# Patient Record
Sex: Male | Born: 1950 | Race: Black or African American | Hispanic: No | Marital: Married | Smoking: Former smoker
Health system: Southern US, Community
[De-identification: ages and names within clinical notes are randomized; demographics above are authoritative.]

## PROBLEM LIST (undated history)

## (undated) DIAGNOSIS — H409 Unspecified glaucoma: Secondary | ICD-10-CM

## (undated) DIAGNOSIS — H269 Unspecified cataract: Secondary | ICD-10-CM

## (undated) DIAGNOSIS — N3281 Overactive bladder: Secondary | ICD-10-CM

## (undated) DIAGNOSIS — J439 Emphysema, unspecified: Secondary | ICD-10-CM

## (undated) HISTORY — PX: COLONOSCOPY: SHX174

## (undated) HISTORY — PX: POLYPECTOMY: SHX149

## (undated) HISTORY — DX: Overactive bladder: N32.81

## (undated) HISTORY — DX: Unspecified cataract: H26.9

## (undated) HISTORY — DX: Unspecified glaucoma: H40.9

## (undated) HISTORY — DX: Emphysema, unspecified: J43.9

## (undated) HISTORY — PX: HAND SURGERY: SHX662

## (undated) HISTORY — PX: DENTAL SURGERY: SHX609

---

## 1999-08-18 ENCOUNTER — Encounter (INDEPENDENT_AMBULATORY_CARE_PROVIDER_SITE_OTHER): Payer: Self-pay | Admitting: Specialist

## 1999-08-18 ENCOUNTER — Other Ambulatory Visit: Admission: RE | Admit: 1999-08-18 | Discharge: 1999-08-18 | Payer: Self-pay | Admitting: Gastroenterology

## 2003-02-06 ENCOUNTER — Ambulatory Visit (HOSPITAL_COMMUNITY): Admission: RE | Admit: 2003-02-06 | Discharge: 2003-02-06 | Payer: Self-pay | Admitting: General Surgery

## 2003-03-10 ENCOUNTER — Ambulatory Visit (HOSPITAL_COMMUNITY): Admission: RE | Admit: 2003-03-10 | Discharge: 2003-03-10 | Payer: Self-pay | Admitting: General Surgery

## 2003-08-23 ENCOUNTER — Emergency Department (HOSPITAL_COMMUNITY): Admission: EM | Admit: 2003-08-23 | Discharge: 2003-08-23 | Payer: Self-pay | Admitting: Emergency Medicine

## 2004-04-24 IMAGING — PT NM PET TUM IMG LTD AREA
1 of 3 series · 6 of 25 positions shown · non-contrast
Comparison: none

CLINICAL DATA: Pancreatic mass.
NUCLEAR MEDICINE PET TUMOR IMAGING ? 03/10/03
Comparing MRI from 01/13/03.

[Series 2: ct images · axial · 3.8mm · 0.98mm/px · z∈[-753,-132]mm · 6 of 267 slices shown]
[im 39/267  soft-tissue]
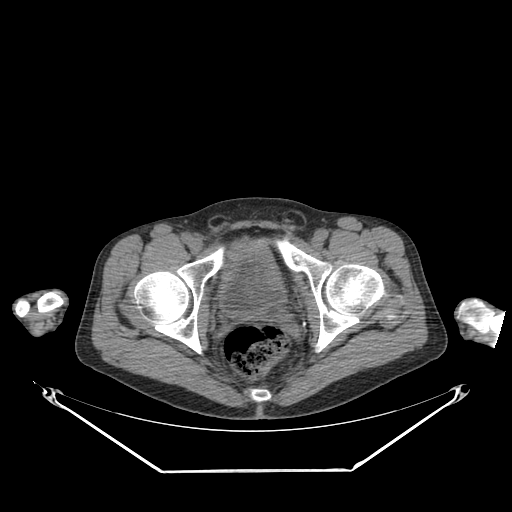
[im 77/267  soft-tissue]
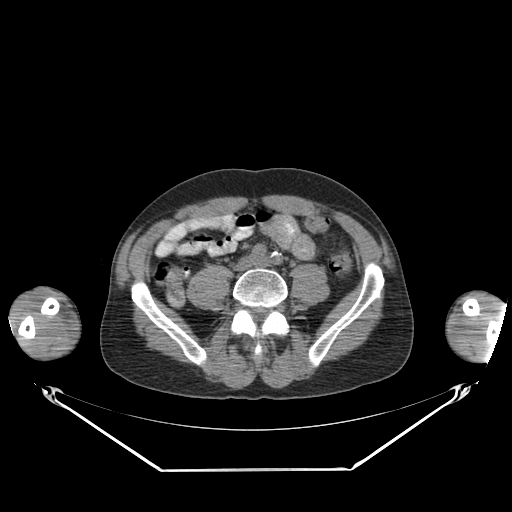
[im 115/267  soft-tissue]
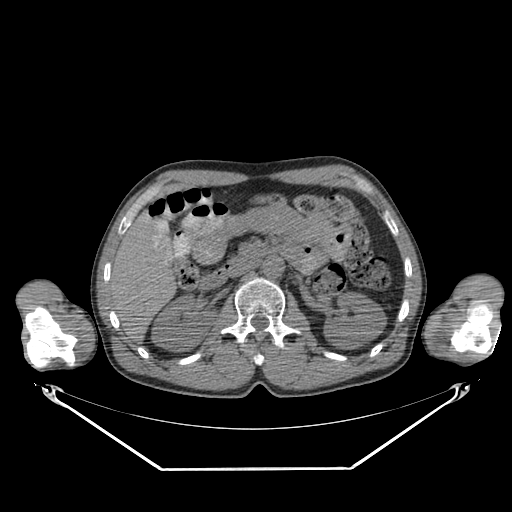
[im 153/267  soft-tissue]
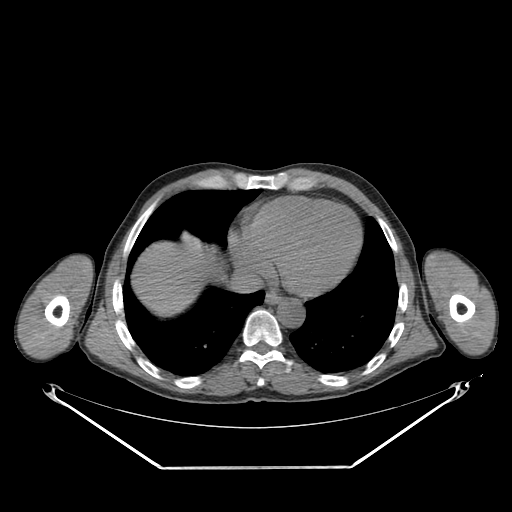
[im 191/267  soft-tissue]
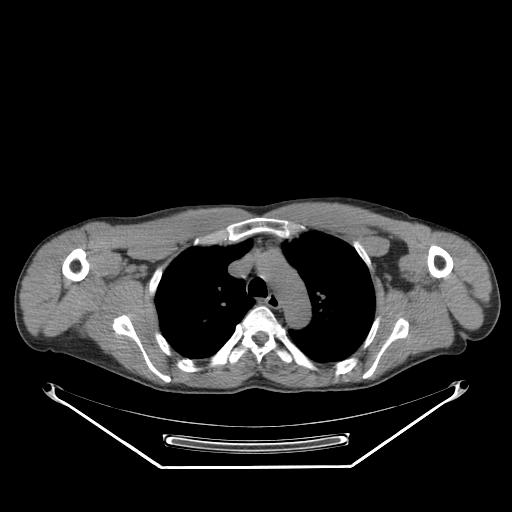
[im 229/267  soft-tissue]
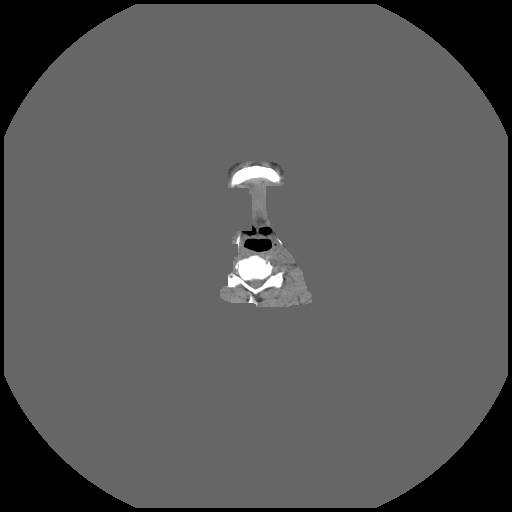

[6 of 25 positions shown; findings below may reference images not displayed]

FINDINGS: We demonstrate no abnormal uptake of FDG in the region of the pancreatic head.  No abnormal hepatic activity is identified.  Overall no significant focus of abnormal activity is present on today?s examination.
IMPRESSION
1.  No abnormal activity in the region of the pancreas is demonstrated on today?s exam.  Specifically, the lesion of concern in the region of the pancreatic head demonstrates no significant abnormality.  The possibility exists that this represents a postinflammatory appearance.  Nuclear medicine PET examination is normally relatively sensitive in detecting pancreatic carcinoma as well as mucinous cystadenocarcinoma, so this appearance on PET exam is reassuring, although the underlying etiology of the lesion still remains in question.  If it is elected not to undergo biopsy, I would suggest some form of surveillance over the next one to two years.

## 2004-09-12 ENCOUNTER — Ambulatory Visit: Payer: Self-pay | Admitting: Internal Medicine

## 2004-09-19 ENCOUNTER — Ambulatory Visit: Payer: Self-pay | Admitting: Internal Medicine

## 2005-03-21 ENCOUNTER — Encounter: Admission: RE | Admit: 2005-03-21 | Discharge: 2005-03-21 | Payer: Self-pay | Admitting: General Surgery

## 2005-07-07 ENCOUNTER — Ambulatory Visit: Payer: Self-pay | Admitting: Internal Medicine

## 2005-10-02 ENCOUNTER — Ambulatory Visit: Payer: Self-pay | Admitting: Internal Medicine

## 2005-10-09 ENCOUNTER — Ambulatory Visit: Payer: Self-pay | Admitting: Internal Medicine

## 2005-10-16 ENCOUNTER — Ambulatory Visit: Payer: Self-pay | Admitting: Cardiology

## 2005-11-06 ENCOUNTER — Ambulatory Visit: Payer: Self-pay | Admitting: Gastroenterology

## 2005-11-21 ENCOUNTER — Encounter (INDEPENDENT_AMBULATORY_CARE_PROVIDER_SITE_OTHER): Payer: Self-pay | Admitting: Specialist

## 2005-11-21 ENCOUNTER — Ambulatory Visit: Payer: Self-pay | Admitting: Gastroenterology

## 2006-11-22 ENCOUNTER — Ambulatory Visit: Payer: Self-pay | Admitting: Internal Medicine

## 2006-11-22 LAB — CONVERTED CEMR LAB
ALT: 15 units/L (ref 0–53)
AST: 19 units/L (ref 0–37)
Albumin: 3.6 g/dL (ref 3.5–5.2)
Alkaline Phosphatase: 60 units/L (ref 39–117)
BUN: 8 mg/dL (ref 6–23)
Bacteria, UA: NEGATIVE
Basophils Absolute: 0.1 10*3/uL (ref 0.0–0.1)
Basophils Relative: 1 % (ref 0.0–1.0)
Bilirubin Urine: NEGATIVE
Bilirubin, Direct: 0.3 mg/dL (ref 0.0–0.3)
CO2: 27 meq/L (ref 19–32)
Calcium: 9.1 mg/dL (ref 8.4–10.5)
Chloride: 106 meq/L (ref 96–112)
Cholesterol: 143 mg/dL (ref 0–200)
Creatinine, Ser: 1 mg/dL (ref 0.4–1.5)
Crystals: NEGATIVE
Eosinophils Absolute: 0.2 10*3/uL (ref 0.0–0.6)
Eosinophils Relative: 2.7 % (ref 0.0–5.0)
GFR calc Af Amer: 99 mL/min
GFR calc non Af Amer: 82 mL/min
Glucose, Bld: 98 mg/dL (ref 70–99)
HCT: 40.1 % (ref 39.0–52.0)
HDL: 53.4 mg/dL (ref >39.0)
Hemoglobin: 13.6 g/dL (ref 13.0–17.0)
Ketones, ur: NEGATIVE mg/dL
LDL Cholesterol: 73 mg/dL (ref 0–99)
Lymphocytes Relative: 27.7 % (ref 12.0–46.0)
MCHC: 34 g/dL (ref 30.0–36.0)
MCV: 91.6 fL (ref 78.0–100.0)
Monocytes Absolute: 0.7 10*3/uL (ref 0.2–0.7)
Monocytes Relative: 7.7 % (ref 3.0–11.0)
Neutro Abs: 5.4 10*3/uL (ref 1.4–7.7)
Neutrophils Relative %: 60.9 % (ref 43.0–77.0)
Nitrite: NEGATIVE
PSA: 1.16 ng/mL (ref 0.10–4.00)
Platelets: 295 10*3/uL (ref 150–400)
Potassium: 5 meq/L (ref 3.5–5.1)
RBC: 4.37 M/uL (ref 4.22–5.81)
RDW: 13.4 % (ref 11.5–14.6)
Sodium: 140 meq/L (ref 135–145)
Specific Gravity, Urine: 1.025 (ref 1.000–1.03)
TSH: 1.7 microintl units/mL (ref 0.35–5.50)
Total Bilirubin: 1.3 mg/dL — ABNORMAL HIGH (ref 0.3–1.2)
Total CHOL/HDL Ratio: 2.7
Total Protein, Urine: NEGATIVE mg/dL
Total Protein: 6.4 g/dL (ref 6.0–8.3)
Triglycerides: 82 mg/dL (ref 0–149)
Urine Glucose: NEGATIVE mg/dL
Urobilinogen, UA: 0.2 (ref 0.0–1.0)
VLDL: 16 mg/dL (ref 0–40)
WBC: 8.9 10*3/uL (ref 4.5–10.5)
pH: 6 (ref 5.0–8.0)

## 2006-11-26 ENCOUNTER — Encounter: Payer: Self-pay | Admitting: Internal Medicine

## 2006-11-26 DIAGNOSIS — K219 Gastro-esophageal reflux disease without esophagitis: Secondary | ICD-10-CM

## 2006-11-26 DIAGNOSIS — Z860100 Personal history of colon polyps, unspecified: Secondary | ICD-10-CM | POA: Insufficient documentation

## 2006-11-27 ENCOUNTER — Encounter: Payer: Self-pay | Admitting: Internal Medicine

## 2006-11-27 ENCOUNTER — Ambulatory Visit: Payer: Self-pay | Admitting: Internal Medicine

## 2006-11-27 DIAGNOSIS — R319 Hematuria, unspecified: Secondary | ICD-10-CM

## 2006-11-27 DIAGNOSIS — R935 Abnormal findings on diagnostic imaging of other abdominal regions, including retroperitoneum: Secondary | ICD-10-CM

## 2006-11-27 DIAGNOSIS — N529 Male erectile dysfunction, unspecified: Secondary | ICD-10-CM

## 2007-11-25 ENCOUNTER — Ambulatory Visit: Payer: Self-pay | Admitting: Internal Medicine

## 2007-11-26 LAB — CONVERTED CEMR LAB
ALT: 15 units/L (ref 0–53)
AST: 15 units/L (ref 0–37)
Albumin: 3.8 g/dL (ref 3.5–5.2)
Alkaline Phosphatase: 58 units/L (ref 39–117)
BUN: 12 mg/dL (ref 6–23)
Basophils Absolute: 0 10*3/uL (ref 0.0–0.1)
Basophils Relative: 0.2 % (ref 0.0–3.0)
Bilirubin Urine: NEGATIVE
Bilirubin, Direct: 0.2 mg/dL (ref 0.0–0.3)
CO2: 28 meq/L (ref 19–32)
Calcium: 9.2 mg/dL (ref 8.4–10.5)
Chloride: 106 meq/L (ref 96–112)
Cholesterol: 149 mg/dL (ref 0–200)
Creatinine, Ser: 0.9 mg/dL (ref 0.4–1.5)
Crystals: NEGATIVE
Eosinophils Absolute: 0.2 10*3/uL (ref 0.0–0.7)
Eosinophils Relative: 3 % (ref 0.0–5.0)
GFR calc Af Amer: 112 mL/min
GFR calc non Af Amer: 92 mL/min
Glucose, Bld: 98 mg/dL (ref 70–99)
HCT: 42.2 % (ref 39.0–52.0)
HDL: 58 mg/dL (ref >39.0)
Hemoglobin: 14.6 g/dL (ref 13.0–17.0)
Ketones, ur: NEGATIVE mg/dL
LDL Cholesterol: 81 mg/dL (ref 0–99)
Leukocytes, UA: NEGATIVE
Lymphocytes Relative: 30.6 % (ref 12.0–46.0)
MCHC: 34.5 g/dL (ref 30.0–36.0)
MCV: 91.2 fL (ref 78.0–100.0)
Monocytes Absolute: 0.7 10*3/uL (ref 0.1–1.0)
Monocytes Relative: 8.3 % (ref 3.0–12.0)
Mucus, UA: NEGATIVE
Neutro Abs: 4.7 10*3/uL (ref 1.4–7.7)
Neutrophils Relative %: 57.9 % (ref 43.0–77.0)
Nitrite: NEGATIVE
PSA: 0.86 ng/mL (ref 0.10–4.00)
Platelets: 281 10*3/uL (ref 150–400)
Potassium: 4.9 meq/L (ref 3.5–5.1)
RBC: 4.63 M/uL (ref 4.22–5.81)
RDW: 14 % (ref 11.5–14.6)
Sodium: 142 meq/L (ref 135–145)
Specific Gravity, Urine: >=1.03 (ref 1.000–1.03)
TSH: 1.19 microintl units/mL (ref 0.35–5.50)
Total Bilirubin: 1.2 mg/dL (ref 0.3–1.2)
Total CHOL/HDL Ratio: 2.6
Total Protein, Urine: NEGATIVE mg/dL
Total Protein: 6.6 g/dL (ref 6.0–8.3)
Triglycerides: 49 mg/dL (ref 0–149)
Urine Glucose: NEGATIVE mg/dL
Urobilinogen, UA: 0.2 (ref 0.0–1.0)
VLDL: 10 mg/dL (ref 0–40)
WBC: 8.1 10*3/uL (ref 4.5–10.5)
pH: 5 (ref 5.0–8.0)

## 2007-12-02 ENCOUNTER — Ambulatory Visit: Payer: Self-pay | Admitting: Internal Medicine

## 2007-12-02 DIAGNOSIS — H6120 Impacted cerumen, unspecified ear: Secondary | ICD-10-CM | POA: Insufficient documentation

## 2007-12-09 ENCOUNTER — Ambulatory Visit: Payer: Self-pay | Admitting: Internal Medicine

## 2008-12-01 ENCOUNTER — Ambulatory Visit: Payer: Self-pay | Admitting: Internal Medicine

## 2008-12-02 LAB — CONVERTED CEMR LAB
ALT: 15 units/L (ref 0–53)
AST: 18 units/L (ref 0–37)
Albumin: 3.9 g/dL (ref 3.5–5.2)
Alkaline Phosphatase: 54 units/L (ref 39–117)
BUN: 12 mg/dL (ref 6–23)
Basophils Absolute: 0 10*3/uL (ref 0.0–0.1)
Basophils Relative: 0.4 % (ref 0.0–3.0)
Bilirubin Urine: NEGATIVE
Bilirubin, Direct: 0.1 mg/dL (ref 0.0–0.3)
CO2: 26 meq/L (ref 19–32)
Calcium: 9.4 mg/dL (ref 8.4–10.5)
Chloride: 103 meq/L (ref 96–112)
Cholesterol: 166 mg/dL (ref 0–200)
Creatinine, Ser: 0.9 mg/dL (ref 0.4–1.5)
Eosinophils Absolute: 0.2 10*3/uL (ref 0.0–0.7)
Eosinophils Relative: 2.7 % (ref 0.0–5.0)
GFR calc non Af Amer: 111.32 mL/min (ref >60)
Glucose, Bld: 90 mg/dL (ref 70–99)
HCT: 41.7 % (ref 39.0–52.0)
HDL: 57.5 mg/dL (ref >39.00)
Hemoglobin: 14.5 g/dL (ref 13.0–17.0)
LDL Cholesterol: 97 mg/dL (ref 0–99)
Leukocytes, UA: NEGATIVE
Lymphocytes Relative: 29.1 % (ref 12.0–46.0)
Lymphs Abs: 2.2 10*3/uL (ref 0.7–4.0)
MCHC: 34.7 g/dL (ref 30.0–36.0)
MCV: 91.8 fL (ref 78.0–100.0)
Monocytes Absolute: 0.7 10*3/uL (ref 0.1–1.0)
Monocytes Relative: 9.1 % (ref 3.0–12.0)
Neutro Abs: 4.5 10*3/uL (ref 1.4–7.7)
Neutrophils Relative %: 58.7 % (ref 43.0–77.0)
Nitrite: NEGATIVE
PSA: 1.03 ng/mL (ref 0.10–4.00)
Platelets: 299 10*3/uL (ref 150.0–400.0)
Potassium: 4 meq/L (ref 3.5–5.1)
RBC: 4.54 M/uL (ref 4.22–5.81)
RDW: 13.2 % (ref 11.5–14.6)
Sodium: 144 meq/L (ref 135–145)
Specific Gravity, Urine: >=1.03 (ref 1.000–1.030)
TSH: 0.79 microintl units/mL (ref 0.35–5.50)
Total Bilirubin: 1 mg/dL (ref 0.3–1.2)
Total CHOL/HDL Ratio: 3
Total Protein, Urine: NEGATIVE mg/dL
Total Protein: 7.2 g/dL (ref 6.0–8.3)
Triglycerides: 59 mg/dL (ref 0.0–149.0)
Urine Glucose: NEGATIVE mg/dL
Urobilinogen, UA: 0.2 (ref 0.0–1.0)
VLDL: 11.8 mg/dL (ref 0.0–40.0)
WBC: 7.6 10*3/uL (ref 4.5–10.5)
pH: 5.5 (ref 5.0–8.0)

## 2008-12-07 ENCOUNTER — Ambulatory Visit: Payer: Self-pay | Admitting: Internal Medicine

## 2008-12-07 DIAGNOSIS — F17200 Nicotine dependence, unspecified, uncomplicated: Secondary | ICD-10-CM

## 2008-12-07 DIAGNOSIS — Z720 Tobacco use: Secondary | ICD-10-CM | POA: Insufficient documentation

## 2009-12-15 ENCOUNTER — Ambulatory Visit: Payer: Self-pay | Admitting: Internal Medicine

## 2009-12-15 LAB — CONVERTED CEMR LAB
ALT: 13 units/L (ref 0–53)
AST: 18 units/L (ref 0–37)
Albumin: 3.9 g/dL (ref 3.5–5.2)
Alkaline Phosphatase: 52 units/L (ref 39–117)
BUN: 14 mg/dL (ref 6–23)
Basophils Absolute: 0 10*3/uL (ref 0.0–0.1)
Basophils Relative: 0.4 % (ref 0.0–3.0)
Bilirubin Urine: NEGATIVE
Bilirubin, Direct: 0.2 mg/dL (ref 0.0–0.3)
CO2: 24 meq/L (ref 19–32)
Calcium: 9.3 mg/dL (ref 8.4–10.5)
Chloride: 105 meq/L (ref 96–112)
Cholesterol: 172 mg/dL (ref 0–200)
Creatinine, Ser: 1 mg/dL (ref 0.4–1.5)
Eosinophils Absolute: 0.1 10*3/uL (ref 0.0–0.7)
Eosinophils Relative: 1.4 % (ref 0.0–5.0)
GFR calc non Af Amer: 99.37 mL/min (ref >60)
Glucose, Bld: 77 mg/dL (ref 70–99)
HCT: 43 % (ref 39.0–52.0)
HDL: 74.6 mg/dL (ref >39.00)
Hemoglobin: 15 g/dL (ref 13.0–17.0)
LDL Cholesterol: 86 mg/dL (ref 0–99)
Leukocytes, UA: NEGATIVE
Lymphocytes Relative: 24 % (ref 12.0–46.0)
Lymphs Abs: 1.9 10*3/uL (ref 0.7–4.0)
MCHC: 35 g/dL (ref 30.0–36.0)
MCV: 92.3 fL (ref 78.0–100.0)
Monocytes Absolute: 0.7 10*3/uL (ref 0.1–1.0)
Monocytes Relative: 9.1 % (ref 3.0–12.0)
Neutro Abs: 5.2 10*3/uL (ref 1.4–7.7)
Neutrophils Relative %: 65.1 % (ref 43.0–77.0)
Nitrite: NEGATIVE
PSA: 0.79 ng/mL (ref 0.10–4.00)
Platelets: 299 10*3/uL (ref 150.0–400.0)
Potassium: 4 meq/L (ref 3.5–5.1)
RBC: 4.65 M/uL (ref 4.22–5.81)
RDW: 15 % — ABNORMAL HIGH (ref 11.5–14.6)
Sodium: 139 meq/L (ref 135–145)
Specific Gravity, Urine: >=1.03 (ref 1.000–1.030)
TSH: 0.91 microintl units/mL (ref 0.35–5.50)
Total Bilirubin: 1.2 mg/dL (ref 0.3–1.2)
Total CHOL/HDL Ratio: 2
Total Protein, Urine: NEGATIVE mg/dL
Total Protein: 6.6 g/dL (ref 6.0–8.3)
Triglycerides: 58 mg/dL (ref 0.0–149.0)
Urine Glucose: NEGATIVE mg/dL
Urobilinogen, UA: 0.2 (ref 0.0–1.0)
VLDL: 11.6 mg/dL (ref 0.0–40.0)
WBC: 8 10*3/uL (ref 4.5–10.5)
pH: 5 (ref 5.0–8.0)

## 2009-12-20 ENCOUNTER — Encounter: Payer: Self-pay | Admitting: Internal Medicine

## 2009-12-20 ENCOUNTER — Ambulatory Visit: Payer: Self-pay | Admitting: Internal Medicine

## 2010-03-22 NOTE — Assessment & Plan Note (Signed)
Summary: CPX /NWS  #   Vital Signs:  Patient profile:   60 year old male Height:      73 inches Weight:      170 pounds BMI:     22.51 Temp:     97.6 degrees F oral Pulse rate:   68 / minute Pulse rhythm:   regular Resp:     16 per minute BP sitting:   130 / 88  (left arm) Cuff size:   regular  Vitals Entered By: Lanier Prude, CMA(AAMA) (December 20, 2009 3:06 PM) CC: CPX   CC:  CPX.  History of Present Illness: The patient presents for a preventive health examination   Preventive Screening-Counseling & Management  Caffeine-Diet-Exercise     Does Patient Exercise: no  Current Medications (verified): 1)  Cialis 20 Mg Tabs (Tadalafil) .... Q3d Prn 2)  Vitamin D3 1000 Unit  Tabs (Cholecalciferol) .Marland Kitchen.. 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  The patient denies anorexia, fever, weight gain, vision loss, decreased hearing, hoarseness, chest pain, syncope, dyspnea on exertion, peripheral edema, prolonged cough, headaches, hemoptysis, abdominal pain, melena, hematochezia, severe indigestion/heartburn, hematuria, incontinence, genital sores, muscle weakness, suspicious skin lesions, transient blindness, difficulty walking, depression, unusual weight change, abnormal bleeding, enlarged lymph nodes, angioedema, and testicular masses.    Physical Exam  General:  Well-developed,well-nourished,in no acute distress; alert,appropriate and cooperative throughout  examination Head:  Normocephalic and atraumatic without obvious abnormalities. No apparent alopecia or balding. Eyes:  No corneal or conjunctival inflammation noted. EOMI. Perrla. Ears:  wax B Nose:  External nasal examination shows no deformity or inflammation. Nasal mucosa are pink and moist without lesions or exudates. Mouth:  Oral mucosa and oropharynx without lesions or exudates.  Teeth in good repair. Neck:  No deformities, masses, or tenderness noted. No bruit Lungs:  Normal respiratory effort, chest expands symmetrically. Lungs are clear to auscultation, no crackles or wheezes. Heart:  Normal rate and regular rhythm. S1 and S2 normal without gallop, murmur, click, rub or other extra sounds. Abdomen:  Bowel sounds positive,abdomen soft and non-tender without masses, organomegaly or hernias noted. Rectal:  declined Genitalia:  declined Prostate:  declined Msk:  No deformity or scoliosis noted of thoracic or lumbar spine.   Pulses:  R and L carotid,radial,femoral,dorsalis pedis and posterior tibial pulses are full and equal bilaterally Extremities:  No clubbing, cyanosis, edema, or deformity noted with normal full range of motion of all joints.   Neurologic:  No cranial nerve deficits noted. Station and gait are normal. Plantar reflexes are down-going bilaterally. DTRs are symmetrical throughout. Sensory, motor and coordinative functions appear intact. Skin:  Intact without suspicious lesions or rashes Psych:  Cognition and judgment appear intact. Alert and cooperative with normal attention span and concentration. No apparent delusions, illusions, hallucinations   Impression & Recommendations:  Problem # 1:  HEALTH MAINTENANCE EXAM (ICD-V70.0) Assessment New Health and age related issues were discussed. Available screening tests and vaccinations were discussed as well. Healthy life style including good diet  and exercise was discussed. The labs were reviewed with the patient.  EKG  OK  Problem # 2:  SMOKER (ICD-305.1) Assessment: Unchanged  Encouraged smoking cessation and discussed different methods for smoking cessation.  CXR 2010 Smoking discussed  Problem # 3:  COLONIC POLYPS, HX OF (ICD-V12.72) Assessment: Improved GI will recall   Problem # 4:  IMPOTENCE (EAV-409.81) Assessment: Unchanged  His updated medication list for this problem includes:    Cialis 20 Mg Tabs (Tadalafil) .Marland Kitchen... 1q3d as needed by mouth  Problem # 5:  CERUMEN IMPACTION (ICD-380.4) Assessment: Deteriorated  Orders: Cerumen Impaction Removal (19147) Procedure: ear irrigation Reason: wax impaction Risks/benefis were discussed. Both ears were irrigated with warm water. Large ammount of wax was recovered. Instrumentation with metal ear loop was performed to accomplish the removal. Tolerated well Complications: none    Complete Medication List: 1)  Cialis 20 Mg Tabs (Tadalafil) .Marland Kitchen.. 1q3d as needed by mouth 2)  Vitamin D3 1000 Unit Tabs (Cholecalciferol) .Marland Kitchen.. 1 by mouth daily 3)  Aspirin 81 Mg Tbec (Aspirin) .Marland Kitchen.. 1 by mouth qd  Other Orders: EKG w/ Interpretation (93000)  Patient Instructions: 1)  Please schedule a follow-up appointment in 1 year well w/lab. Prescriptions: CIALIS 20 MG TABS (TADALAFIL) 1q3d as needed by mouth  #12 x 6   Entered and Authorized by:   Tresa Garter MD   Signed by:   Tresa Garter MD on 12/20/2009   Method used:   Print then Give to Patient   RxID:   [id number]    Orders Added: 1)  EKG w/ Interpretation [93000] 2)  Est. Patient age 30-64 [71] 3)  Cerumen Impaction Removal [69210]    Contraindications/Deferment of Procedures/Staging:    Test/Procedure: FLU VAX    Reason for deferment: patient declined     Test/Procedure: DPT vaccine    Reason for deferment: declined

## 2010-07-08 NOTE — Assessment & Plan Note (Signed)
[facility]                             PRIMARY CARE OFFICE NOTE   NAME:Travis Hicks, Travis Hicks                        MRN:          [id number]  DATE:10/09/2005                            DOB:          [birth year]    Patient is a 60 year old male who presents for wellness examination.   Past medical history, family history, social history as per September 19, 2004  note.   CURRENT MEDICATIONS:  None.   ALLERGIES:  None.   REVIEW OF SYSTEMS:  No chest pain or shortness of breath.  No blood in the  stool.  No urinary complaints.  Occasional indigestion.  Occasional slight  bloating, food related.  No blood in the stool.  He continues to smoke about  three quarters of a pack a day.  The rest is negative.   PHYSICAL EXAMINATION:  VITAL SIGNS:  Blood pressure 122/81, pulse 77, temp  97.  Weight 183 pounds.  GENERAL:  He looks well.  He is in no acute distress.  HEENT:  Moist mucosa.  NECK:  Supple.  No thyromegaly or bruit.  LUNGS:  Clear.  No wheezes or rales.  HEART:  S1 and S2.  No murmur or gallop.  ABDOMEN:  Soft and nontender.  No megaly or masses felt.  EXTREMITIES:  Lower extremities without edema.  NEUROLOGIC:  He is alert, oriented and cooperative.  Denies being depressed.  SKIN:  Clear.  RECTAL/GENITAL:  He declined rectal/genital exam.   LABS:  On October 02, 2005, CBC normal.  Cholesterol 184, LDL 120, HDL 43.6.  Sodium 146. TSH normal.  PSA 0.88.  Testosterone 450.  Urinalysis with a  large amount of blood.   ASSESSMENT/PLAN:  Normal wellness examination.  Age/health-related issues  discussed.  Healthy lifestyle discussed.  He needs to stop smoking.  Will  obtain chest x-ray.  Repeat exam in 12 months.                                   Sonda Primes, MD   AP/MedQ  DD:  10/09/2005  DT:  10/09/2005  Job #:  [id number]

## 2010-07-08 NOTE — Assessment & Plan Note (Signed)
[facility]                             PRIMARY CARE OFFICE NOTE   NAME:Travis Hicks, Travis Hicks                        MRN:          [id number]  DATE:10/09/2005                            DOB:          11-May-1950    SEPARATE EVALUATION AND MANAGEMENT:  We need to address a number of problems  with Mr. Joanna today, including tobacco smoking, indigestion, erectile  dysfunction, microhematuria, occasional abdominal bloating, and history of  pancreatic masses detected in 2005 and thought to be benign.   Past medical history, family history, and social history as per September 19, 2004 note.   CURRENT MEDICATIONS:  None.   ALLERGIES:  None.   REVIEW OF SYSTEMS:  No chest pain.  Occasional abdominal cramping, unrelated  to food.  No weight loss.  No nausea, vomiting.  No blood in his stool or in  the urine.  The rest is negative.   PHYSICAL EXAMINATION:  VITAL SIGNS:  Blood pressure 122/81, pulse 77, temp  97.  Weight 183 pounds.  GENERAL:  He is in no acute distress.  HEENT:  Moist mucosa.  NECK:  Supple.  No thyromegaly or bruit.  LUNGS:  Clear.  No wheezes or rales.  CARDIOVASCULAR:  S1 and S2.  No murmur or gallop.  ABDOMEN:  Soft, nontender.  No organomegaly or masses felt.  EXTREMITIES:  Lower extremities without edema.  NEUROLOGIC:  Patient is alert and cooperative.  Denies being depressed.  SKIN:  Clear.  No jaundice.  RECTAL/GENITAL:  Declined by the patient.   Labs of October 02, 2005 reviewed.   EKG:  Normal.   ASSESSMENT/PLAN:  1. 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  I would like      to obtain a CT scan of the abdomen and pelvis with contrast to confirm      stability.  2. Tobacco smoking:  Options discussed.  Given information about Chantix.  3. Chronic microhematuria, status post __________ in the recent workup in      2004 by Dr. Brunilda Payor.  Will continue to monitor.  4. Occasional gastroesophageal reflux disease:  Given  Zantac 150 p.o.      daily p.r.n.  5. Occasional bloating related to diet:  Will avoid grapes, apple juice,      raisins, etc.  6. Erectile dysfunction:  Cialis 20 mg every 3 days p.r.n.                                   Sonda Primes, MD   AP/MedQ  DD:  10/09/2005  DT:  10/09/2005  Job #:  [id number]

## 2010-12-29 ENCOUNTER — Encounter: Payer: Self-pay | Admitting: Gastroenterology

## 2011-05-23 ENCOUNTER — Other Ambulatory Visit (INDEPENDENT_AMBULATORY_CARE_PROVIDER_SITE_OTHER): Payer: Federal, State, Local not specified - PPO

## 2011-05-23 ENCOUNTER — Ambulatory Visit (INDEPENDENT_AMBULATORY_CARE_PROVIDER_SITE_OTHER)
Admission: RE | Admit: 2011-05-23 | Discharge: 2011-05-23 | Disposition: A | Discharge status: Home / Self Care | Payer: Federal, State, Local not specified - PPO | Source: Ambulatory Visit | Attending: Internal Medicine | Admitting: Internal Medicine

## 2011-05-23 ENCOUNTER — Encounter: Payer: Self-pay | Admitting: Internal Medicine

## 2011-05-23 ENCOUNTER — Ambulatory Visit (INDEPENDENT_AMBULATORY_CARE_PROVIDER_SITE_OTHER): Payer: Federal, State, Local not specified - PPO | Admitting: Internal Medicine

## 2011-05-23 ENCOUNTER — Telehealth: Payer: Self-pay | Admitting: Internal Medicine

## 2011-05-23 VITALS — BP 130/90 | HR 80 | Temp 98.3°F | Resp 16 | Ht 73.0 in | Wt 165.0 lb

## 2011-05-23 DIAGNOSIS — D379 Neoplasm of uncertain behavior of digestive organ, unspecified: Secondary | ICD-10-CM

## 2011-05-23 DIAGNOSIS — R05 Cough: Secondary | ICD-10-CM

## 2011-05-23 DIAGNOSIS — R319 Hematuria, unspecified: Secondary | ICD-10-CM

## 2011-05-23 DIAGNOSIS — N529 Male erectile dysfunction, unspecified: Secondary | ICD-10-CM

## 2011-05-23 DIAGNOSIS — Z0000 Encounter for general adult medical examination without abnormal findings: Secondary | ICD-10-CM

## 2011-05-23 DIAGNOSIS — Z136 Encounter for screening for cardiovascular disorders: Secondary | ICD-10-CM

## 2011-05-23 DIAGNOSIS — H6120 Impacted cerumen, unspecified ear: Secondary | ICD-10-CM

## 2011-05-23 DIAGNOSIS — F17200 Nicotine dependence, unspecified, uncomplicated: Secondary | ICD-10-CM

## 2011-05-23 DIAGNOSIS — R059 Cough, unspecified: Secondary | ICD-10-CM

## 2011-05-23 LAB — URINALYSIS, ROUTINE W REFLEX MICROSCOPIC
Bilirubin Urine: NEGATIVE
Ketones, ur: NEGATIVE
Leukocytes, UA: NEGATIVE
Nitrite: NEGATIVE
Specific Gravity, Urine: >=1.03 (ref 1.000–1.030)
Total Protein, Urine: NEGATIVE
Urine Glucose: NEGATIVE
Urobilinogen, UA: 0.2 (ref 0.0–1.0)
pH: 5.5 (ref 5.0–8.0)

## 2011-05-23 LAB — HEPATIC FUNCTION PANEL
ALT: 16 U/L (ref 0–53)
AST: 18 U/L (ref 0–37)
Albumin: 4.5 g/dL (ref 3.5–5.2)
Alkaline Phosphatase: 50 U/L (ref 39–117)
Bilirubin, Direct: 0.1 mg/dL (ref 0.0–0.3)
Total Bilirubin: 0.6 mg/dL (ref 0.3–1.2)
Total Protein: 7.2 g/dL (ref 6.0–8.3)

## 2011-05-23 LAB — CBC WITH DIFFERENTIAL/PLATELET
Basophils Absolute: 0 10*3/uL (ref 0.0–0.1)
Basophils Relative: 0.4 % (ref 0.0–3.0)
Eosinophils Absolute: 0.2 10*3/uL (ref 0.0–0.7)
Eosinophils Relative: 1.8 % (ref 0.0–5.0)
HCT: 46.2 % (ref 39.0–52.0)
Hemoglobin: 15.7 g/dL (ref 13.0–17.0)
Lymphocytes Relative: 21.1 % (ref 12.0–46.0)
Lymphs Abs: 2.1 10*3/uL (ref 0.7–4.0)
MCHC: 33.9 g/dL (ref 30.0–36.0)
MCV: 92.8 fl (ref 78.0–100.0)
Monocytes Absolute: 0.7 10*3/uL (ref 0.1–1.0)
Monocytes Relative: 7.3 % (ref 3.0–12.0)
Neutro Abs: 7 10*3/uL (ref 1.4–7.7)
Neutrophils Relative %: 69.4 % (ref 43.0–77.0)
Platelets: 286 10*3/uL (ref 150.0–400.0)
RBC: 4.98 Mil/uL (ref 4.22–5.81)
RDW: 15.1 % — ABNORMAL HIGH (ref 11.5–14.6)
WBC: 10.1 10*3/uL (ref 4.5–10.5)

## 2011-05-23 LAB — BASIC METABOLIC PANEL
BUN: 15 mg/dL (ref 6–23)
CO2: 27 mEq/L (ref 19–32)
Calcium: 10 mg/dL (ref 8.4–10.5)
Chloride: 107 mEq/L (ref 96–112)
Creatinine, Ser: 1 mg/dL (ref 0.4–1.5)
GFR: 95.54 mL/min (ref >60.00)
Glucose, Bld: 98 mg/dL (ref 70–99)
Potassium: 4.7 mEq/L (ref 3.5–5.1)
Sodium: 144 mEq/L (ref 135–145)

## 2011-05-23 LAB — LIPID PANEL
Cholesterol: 199 mg/dL (ref 0–200)
HDL: 78.8 mg/dL (ref >39.00)
LDL Cholesterol: 101 mg/dL — ABNORMAL HIGH (ref 0–99)
Total CHOL/HDL Ratio: 3
Triglycerides: 96 mg/dL (ref 0.0–149.0)
VLDL: 19.2 mg/dL (ref 0.0–40.0)

## 2011-05-23 LAB — PSA: PSA: 0.75 ng/mL (ref 0.10–4.00)

## 2011-05-23 LAB — TSH: TSH: 0.89 u[IU]/mL (ref 0.35–5.50)

## 2011-05-23 MED ORDER — TADALAFIL 20 MG PO TABS: 20.0000 mg | ORAL_TABLET | Freq: Every day | ORAL | Status: DC | PRN

## 2011-05-23 MED ORDER — VITAMIN D 1000 UNITS PO TABS: 1000.0000 [IU] | ORAL_TABLET | Freq: Every day | ORAL | Status: DC

## 2011-05-23 MED ORDER — NEOMYCIN-POLYMYXIN-HC 3.5-10000-1 OT SOLN: 3.0000 [drp] | Freq: Three times a day (TID) | OTIC | Status: AC

## 2011-05-23 NOTE — Assessment & Plan Note (Signed)
Life-long S/p Urol eval x 2 

## 2011-05-23 NOTE — Telephone Encounter (Signed)
Pt informed

## 2011-05-23 NOTE — Assessment & Plan Note (Signed)
Discussed.

## 2011-05-23 NOTE — Assessment & Plan Note (Signed)
Will irrigate 

## 2011-05-23 NOTE — Telephone Encounter (Signed)
Misty Stanley, please, inform patient that all labs are normal except for small blood in urine as before Thx

## 2011-05-23 NOTE — Assessment & Plan Note (Signed)
2005 benign - had a PET CT F/u CT in 2007 ok

## 2011-05-23 NOTE — Assessment & Plan Note (Signed)
We discussed age appropriate health related issues, including available/recomended screening tests and vaccinations. We discussed a need for adhering to healthy diet and exercise. Labs/EKG were reviewed/ordered. All questions were answered.   

## 2011-05-23 NOTE — Patient Instructions (Signed)
Wt Readings from Last 3 Encounters:  05/23/11 165 lb (74.844 kg)  12/20/09 170 lb (77.111 kg)  12/07/08 175 lb (79.379 kg)

## 2011-05-23 NOTE — Progress Notes (Signed)
Subjective:    Patient ID: Travis Hicks, male    DOB: Jan 04, 1951, 61 y.o.   MRN: [id number]  HPI  The patient is here for a wellness exam. The patient has been doing well overall without major physical or psychological issues going on lately. F/u abn abd CT, hematuria C/o cough - chronic  BP Readings from Last 3 Encounters:  05/23/11 130/90  12/20/09 130/88  12/07/08 124/90   Wt Readings from Last 3 Encounters:  05/23/11 165 lb (74.844 kg)  12/20/09 170 lb (77.111 kg)  12/07/08 175 lb (79.379 kg)       Review of Systems  Constitutional: Negative for appetite change, fatigue and unexpected weight change.  HENT: Negative for nosebleeds, congestion, sore throat, sneezing, trouble swallowing, neck pain and dental problem.   Eyes: Negative for itching and visual disturbance.  Respiratory: Positive for cough. Negative for apnea, shortness of breath, wheezing and stridor.   Cardiovascular: Negative for chest pain, palpitations and leg swelling.  Gastrointestinal: Negative for nausea, diarrhea, blood in stool and abdominal distention.  Genitourinary: Negative for frequency and hematuria.  Musculoskeletal: Negative for back pain, joint swelling and gait problem.  Skin: Negative for rash.  Neurological: Negative for dizziness, tremors, speech difficulty and weakness.  Psychiatric/Behavioral: Negative for suicidal ideas, sleep disturbance, dysphoric mood and agitation. The patient is not nervous/anxious.        Objective:   Physical Exam  Constitutional: He is oriented to person, place, and time. He appears well-developed and well-nourished. No distress.  HENT:  Head: Normocephalic and atraumatic.  Nose: Nose normal.  Mouth/Throat: Oropharynx is clear and moist. No oropharyngeal exudate.       B wax  Eyes: Conjunctivae and EOM are normal. Pupils are equal, round, and reactive to light. Right eye exhibits no discharge. Left eye exhibits no discharge. No scleral icterus.  Neck:  Normal range of motion. Neck supple. No JVD present. No tracheal deviation present. No thyromegaly present.  Cardiovascular: Normal rate, regular rhythm, normal heart sounds and intact distal pulses.  Exam reveals no gallop and no friction rub.   No murmur heard. Pulmonary/Chest: Effort normal and breath sounds normal. No stridor. No respiratory distress. He has no wheezes. He has no rales. He exhibits no tenderness.  Abdominal: Soft. Bowel sounds are normal. He exhibits no distension and no mass. There is no tenderness. There is no rebound and no guarding.  Genitourinary: Rectum normal, prostate normal and penis normal. Guaiac negative stool. No penile tenderness.  Musculoskeletal: Normal range of motion. He exhibits no edema and no tenderness.  Lymphadenopathy:    He has no cervical adenopathy.  Neurological: He is alert and oriented to person, place, and time. He has normal reflexes. No cranial nerve deficit. He exhibits normal muscle tone. Coordination normal.  Skin: Skin is warm and dry. No rash noted. He is not diaphoretic. No erythema. No pallor.  Psychiatric: He has a normal mood and affect. His behavior is normal. Judgment and thought content normal.   EKG OK   Procedure Note :     Procedure :  Ear irrigation   Indication:  Cerumen impaction B   Risks, including pain, dizziness, eardrum perforation, bleeding, infection and others as well as benefits were explained to the patient in detail. Verbal consent was obtained and the patient agreed to proceed.    We used "The [employer] Device" field with lukewarm water for irrigation. A large amount wax was recovered. Procedure has also required manual wax removal with  an ear loop.   Tolerated well. Complications: None.   Postprocedure instructions :  Call if problems.       Assessment & Plan:

## 2011-05-23 NOTE — Assessment & Plan Note (Signed)
Continue with current prescription therapy as reflected on the Med list.  

## 2011-11-21 ENCOUNTER — Ambulatory Visit: Payer: Federal, State, Local not specified - PPO | Admitting: Internal Medicine

## 2013-01-14 ENCOUNTER — Ambulatory Visit (INDEPENDENT_AMBULATORY_CARE_PROVIDER_SITE_OTHER): Payer: Federal, State, Local not specified - PPO

## 2013-01-14 ENCOUNTER — Ambulatory Visit (INDEPENDENT_AMBULATORY_CARE_PROVIDER_SITE_OTHER): Payer: Federal, State, Local not specified - PPO | Admitting: Internal Medicine

## 2013-01-14 ENCOUNTER — Encounter: Payer: Self-pay | Admitting: Internal Medicine

## 2013-01-14 VITALS — BP 130/92 | HR 80 | Temp 97.4°F | Resp 16 | Ht 72.0 in | Wt 168.0 lb

## 2013-01-14 DIAGNOSIS — G8929 Other chronic pain: Secondary | ICD-10-CM

## 2013-01-14 DIAGNOSIS — R1013 Epigastric pain: Secondary | ICD-10-CM

## 2013-01-14 DIAGNOSIS — D379 Neoplasm of uncertain behavior of digestive organ, unspecified: Secondary | ICD-10-CM

## 2013-01-14 DIAGNOSIS — B079 Viral wart, unspecified: Secondary | ICD-10-CM

## 2013-01-14 DIAGNOSIS — R1030 Lower abdominal pain, unspecified: Secondary | ICD-10-CM | POA: Insufficient documentation

## 2013-01-14 DIAGNOSIS — F17200 Nicotine dependence, unspecified, uncomplicated: Secondary | ICD-10-CM

## 2013-01-14 DIAGNOSIS — H6120 Impacted cerumen, unspecified ear: Secondary | ICD-10-CM

## 2013-01-14 DIAGNOSIS — H6123 Impacted cerumen, bilateral: Secondary | ICD-10-CM

## 2013-01-14 DIAGNOSIS — K219 Gastro-esophageal reflux disease without esophagitis: Secondary | ICD-10-CM

## 2013-01-14 DIAGNOSIS — Z0000 Encounter for general adult medical examination without abnormal findings: Secondary | ICD-10-CM

## 2013-01-14 DIAGNOSIS — F1020 Alcohol dependence, uncomplicated: Secondary | ICD-10-CM | POA: Insufficient documentation

## 2013-01-14 LAB — BASIC METABOLIC PANEL
BUN: 10 mg/dL (ref 6–23)
Chloride: 105 mEq/L (ref 96–112)
Creatinine, Ser: 0.9 mg/dL (ref 0.4–1.5)
GFR: 104.41 mL/min (ref >60.00)
Potassium: 4.4 mEq/L (ref 3.5–5.1)
Sodium: 141 mEq/L (ref 135–145)

## 2013-01-14 LAB — HEPATIC FUNCTION PANEL
ALT: 17 U/L (ref 0–53)
AST: 18 U/L (ref 0–37)
Albumin: 4.1 g/dL (ref 3.5–5.2)
Alkaline Phosphatase: 50 U/L (ref 39–117)
Total Bilirubin: 0.9 mg/dL (ref 0.3–1.2)

## 2013-01-14 LAB — CBC WITH DIFFERENTIAL/PLATELET
Basophils Absolute: 0 10*3/uL (ref 0.0–0.1)
Basophils Relative: 0.3 % (ref 0.0–3.0)
Eosinophils Relative: 1.9 % (ref 0.0–5.0)
HCT: 45.1 % (ref 39.0–52.0)
Lymphocytes Relative: 24.6 % (ref 12.0–46.0)
Lymphs Abs: 1.8 10*3/uL (ref 0.7–4.0)
MCV: 91.6 fl (ref 78.0–100.0)
Monocytes Absolute: 0.5 10*3/uL (ref 0.1–1.0)
Platelets: 278 10*3/uL (ref 150.0–400.0)
RBC: 4.92 Mil/uL (ref 4.22–5.81)
RDW: 14.6 % (ref 11.5–14.6)
WBC: 7.5 10*3/uL (ref 4.5–10.5)

## 2013-01-14 LAB — URINALYSIS, ROUTINE W REFLEX MICROSCOPIC
Bilirubin Urine: NEGATIVE
Ketones, ur: NEGATIVE
Nitrite: NEGATIVE
Specific Gravity, Urine: >=1.03 (ref 1.000–1.030)
Total Protein, Urine: NEGATIVE
Urine Glucose: NEGATIVE
pH: 5.5 (ref 5.0–8.0)

## 2013-01-14 LAB — VITAMIN B12: Vitamin B-12: 303 pg/mL (ref 211–911)

## 2013-01-14 LAB — PSA: PSA: 0.83 ng/mL (ref 0.10–4.00)

## 2013-01-14 LAB — LIPID PANEL
Cholesterol: 200 mg/dL (ref 0–200)
LDL Cholesterol: 106 mg/dL — ABNORMAL HIGH (ref 0–99)
VLDL: 24.4 mg/dL (ref 0.0–40.0)

## 2013-01-14 MED ORDER — VITAMIN D 1000 UNITS PO TABS: 1000.0000 [IU] | ORAL_TABLET | Freq: Every day | ORAL | Status: DC

## 2013-01-14 MED ORDER — RANITIDINE HCL 150 MG PO TABS: 150.0000 mg | ORAL_TABLET | Freq: Two times a day (BID) | ORAL | Status: DC

## 2013-01-14 MED ORDER — B COMPLEX PO TABS: 1.0000 | ORAL_TABLET | Freq: Every day | ORAL | Status: AC

## 2013-01-14 NOTE — Assessment & Plan Note (Signed)
See procedure 

## 2013-01-14 NOTE — Assessment & Plan Note (Signed)
Pt declined colonoscopy

## 2013-01-14 NOTE — Assessment & Plan Note (Signed)
Ranitidine bid 

## 2013-01-14 NOTE — Assessment & Plan Note (Signed)
11/14 h/o abn CT Repeat CT Labs

## 2013-01-14 NOTE — Patient Instructions (Signed)
   Postprocedure instructions :     Keep the wounds clean. You can wash them with liquid soap and water. Pat dry with gauze or a Kleenex tissue  Before applying antibiotic ointment and a Band-Aid.   You need to report immediately  if  any signs of infection develop.    

## 2013-01-14 NOTE — Assessment & Plan Note (Signed)
Chronic Dry since summer 2014

## 2013-01-14 NOTE — Progress Notes (Signed)
Subjective:    HPI  The patient is here for a wellness exam.   The patient has been doing well overall without major physical or psychological issues going on lately. He was drinking 1/2 a pint/d - quit C/o growth on finger F/u abn abd CT, hematuria. C/o abd discomfort off and on C/o cough - chronic  BP Readings from Last 3 Encounters:  01/14/13 130/92  05/23/11 130/90  12/20/09 130/88   Wt Readings from Last 3 Encounters:  01/14/13 168 lb (76.204 kg)  05/23/11 165 lb (74.844 kg)  12/20/09 170 lb (77.111 kg)       Review of Systems  Constitutional: Negative for appetite change, fatigue and unexpected weight change.  HENT: Negative for congestion, dental problem, nosebleeds, sneezing, sore throat and trouble swallowing.   Eyes: Negative for itching and visual disturbance.  Respiratory: Positive for cough. Negative for apnea, shortness of breath, wheezing and stridor.   Cardiovascular: Negative for chest pain, palpitations and leg swelling.  Gastrointestinal: Negative for nausea, diarrhea, blood in stool and abdominal distention.  Genitourinary: Negative for frequency and hematuria.  Musculoskeletal: Negative for back pain, gait problem, joint swelling and neck pain.  Skin: Negative for rash.  Neurological: Negative for dizziness, tremors, speech difficulty and weakness.  Psychiatric/Behavioral: Negative for suicidal ideas, sleep disturbance, dysphoric mood and agitation. The patient is not nervous/anxious.        Objective:   Physical Exam  Constitutional: He is oriented to person, place, and time. He appears well-developed and well-nourished. No distress.  HENT:  Head: Normocephalic and atraumatic.  Nose: Nose normal.  Mouth/Throat: Oropharynx is clear and moist. No oropharyngeal exudate.  B wax  Eyes: Conjunctivae and EOM are normal. Pupils are equal, round, and reactive to light. Right eye exhibits no discharge. Left eye exhibits no discharge. No scleral icterus.   Neck: Normal range of motion. Neck supple. No JVD present. No tracheal deviation present. No thyromegaly present.  Cardiovascular: Normal rate, regular rhythm, normal heart sounds and intact distal pulses.  Exam reveals no gallop and no friction rub.   No murmur heard. Pulmonary/Chest: Effort normal and breath sounds normal. No stridor. No respiratory distress. He has no wheezes. He has no rales. He exhibits no tenderness.  Abdominal: Soft. Bowel sounds are normal. He exhibits no distension and no mass. There is no tenderness. There is no rebound and no guarding.  Genitourinary: Penis normal.  Pt declined rectal exam  Musculoskeletal: Normal range of motion. He exhibits no edema and no tenderness.  Lymphadenopathy:    He has no cervical adenopathy.  Neurological: He is alert and oriented to person, place, and time. He has normal reflexes. No cranial nerve deficit. He exhibits normal muscle tone. Coordination normal.  Skin: Skin is warm and dry. No rash noted. He is not diaphoretic. No erythema. No pallor.  Psychiatric: He has a normal mood and affect. His behavior is normal. Judgment and thought content normal.   EKG OK   Procedure Note :     Procedure : Cryosurgery   Indication:  Wart(s)  3d L finger   Risks including unsuccessful procedure , bleeding, infection, bruising, scar, a need for a repeat  procedure and others were explained to the patient in detail as well as the benefits. Informed consent was obtained verbally.    1 lesion(s)  on  L 3d finger  was/were treated with liquid nitrogen on a Q-tip in a usual fasion . Band-Aid was applied and antibiotic ointment was given for  a later use.   Tolerated well. Complications none.   Postprocedure instructions :     Keep the wounds clean. You can wash them with liquid soap and water. Pat dry with gauze or a Kleenex tissue  Before applying antibiotic ointment and a Band-Aid.   You need to report immediately  if  any signs of  infection develop.       Assessment & Plan:

## 2013-01-14 NOTE — Assessment & Plan Note (Signed)
Discussed.

## 2013-01-14 NOTE — Progress Notes (Signed)
Pre visit review using our clinic review tool, if applicable. No additional management support is needed unless otherwise documented below in the visit note. 

## 2013-01-14 NOTE — Assessment & Plan Note (Signed)
Pt declined procedure Use OTC kit

## 2013-01-14 NOTE — Assessment & Plan Note (Addendum)
We discussed age appropriate health related issues, including available/recomended screening tests and vaccinations. We discussed a need for adhering to healthy diet and exercise. Labs/EKG were reviewed/ordered. All questions were answered. Refused all shots

## 2013-01-15 LAB — VITAMIN D 25 HYDROXY (VIT D DEFICIENCY, FRACTURES): Vit D, 25-Hydroxy: 16 ng/mL — ABNORMAL LOW (ref 30–89)

## 2013-01-15 LAB — TSH: TSH: 0.97 u[IU]/mL (ref 0.35–5.50)

## 2013-01-16 ENCOUNTER — Other Ambulatory Visit: Payer: Self-pay | Admitting: Internal Medicine

## 2013-01-16 MED ORDER — ERGOCALCIFEROL 1.25 MG (50000 UT) PO CAPS: 50000.0000 [IU] | ORAL_CAPSULE | ORAL | Status: DC

## 2013-01-17 ENCOUNTER — Other Ambulatory Visit: Payer: Federal, State, Local not specified - PPO

## 2013-01-17 ENCOUNTER — Ambulatory Visit (INDEPENDENT_AMBULATORY_CARE_PROVIDER_SITE_OTHER)
Admission: RE | Admit: 2013-01-17 | Discharge: 2013-01-17 | Disposition: A | Discharge status: Home / Self Care | Payer: Federal, State, Local not specified - PPO | Source: Ambulatory Visit | Attending: Internal Medicine | Admitting: Internal Medicine

## 2013-01-17 DIAGNOSIS — G8929 Other chronic pain: Secondary | ICD-10-CM

## 2013-01-17 DIAGNOSIS — D379 Neoplasm of uncertain behavior of digestive organ, unspecified: Secondary | ICD-10-CM

## 2013-01-17 DIAGNOSIS — R1013 Epigastric pain: Secondary | ICD-10-CM

## 2013-01-17 MED ORDER — IOHEXOL 300 MG/ML  SOLN
100.0000 mL | Freq: Once | INTRAMUSCULAR | Status: AC | PRN
  Administered 2013-01-17: 100 mL via INTRAVENOUS

## 2013-01-21 ENCOUNTER — Other Ambulatory Visit: Payer: Federal, State, Local not specified - PPO

## 2013-04-15 ENCOUNTER — Ambulatory Visit: Payer: Federal, State, Local not specified - PPO | Admitting: Internal Medicine

## 2013-04-24 ENCOUNTER — Telehealth: Payer: Self-pay | Admitting: *Deleted

## 2013-04-24 NOTE — Telephone Encounter (Signed)
OV w/any provider or ER visit Thx

## 2013-04-24 NOTE — Telephone Encounter (Signed)
Patient phoned again, stating that his pain is worsening & wants to know if he should go ahead & go to ED today---has OV with PCP tomorrow at 1615.    When patient called earlier, he said he would've rated his pain a 4-5, he is rating it at a 7 now (testicular). Pain increased with ambulating.  Pain & swelling in left testicle (approximates the size is about the size of a golfball-but states his testicles are normally smaller) ; (with umbilicus being center of a clock), patient states his pain is at 1225 (LLQ)  Further states pain is also increased with touch/washing.   He is rescheduling his appt with PCP at 1615 tomorrow to 0800 with [facility] in the morning.  Advised pt that if pain or swelling worsened tonight, he may want to consider ED visit.   CB# [phone]

## 2013-04-24 NOTE — Telephone Encounter (Signed)
Patient phoned stating he thinks he has a hernia "left testicle is swollen and lower abd swollen and tender"  Please advise.  CB# [phone]

## 2013-04-25 ENCOUNTER — Encounter: Payer: Self-pay | Admitting: Internal Medicine

## 2013-04-25 ENCOUNTER — Other Ambulatory Visit (INDEPENDENT_AMBULATORY_CARE_PROVIDER_SITE_OTHER): Payer: Federal, State, Local not specified - PPO

## 2013-04-25 ENCOUNTER — Telehealth: Payer: Self-pay | Admitting: Internal Medicine

## 2013-04-25 ENCOUNTER — Ambulatory Visit: Payer: Federal, State, Local not specified - PPO | Admitting: Internal Medicine

## 2013-04-25 ENCOUNTER — Ambulatory Visit (INDEPENDENT_AMBULATORY_CARE_PROVIDER_SITE_OTHER): Payer: Federal, State, Local not specified - PPO | Admitting: Internal Medicine

## 2013-04-25 VITALS — BP 130/80 | HR 91 | Temp 98.3°F | Wt 164.4 lb

## 2013-04-25 DIAGNOSIS — N451 Epididymitis: Secondary | ICD-10-CM

## 2013-04-25 DIAGNOSIS — N453 Epididymo-orchitis: Secondary | ICD-10-CM

## 2013-04-25 DIAGNOSIS — R300 Dysuria: Secondary | ICD-10-CM

## 2013-04-25 LAB — URINALYSIS, ROUTINE W REFLEX MICROSCOPIC
Ketones, ur: NEGATIVE
NITRITE: POSITIVE — AB
Specific Gravity, Urine: >=1.03 — AB (ref 1.000–1.030)
Total Protein, Urine: 30 — AB
UROBILINOGEN UA: 0.2 (ref 0.0–1.0)
Urine Glucose: NEGATIVE
pH: 5.5 (ref 5.0–8.0)

## 2013-04-25 MED ORDER — CEFTRIAXONE SODIUM 500 MG IJ SOLR
250.0000 mg | Freq: Once | INTRAMUSCULAR | Status: AC
  Administered 2013-04-25: 250 mg via INTRAMUSCULAR

## 2013-04-25 MED ORDER — TRAMADOL HCL 50 MG PO TABS: 50.0000 mg | ORAL_TABLET | Freq: Four times a day (QID) | ORAL | Status: DC | PRN

## 2013-04-25 MED ORDER — CEFTRIAXONE SODIUM 250 MG IJ SOLR: 250.0000 mg | Freq: Once | INTRAMUSCULAR | Status: DC

## 2013-04-25 MED ORDER — DOXYCYCLINE HYCLATE 100 MG PO TABS: 100.0000 mg | ORAL_TABLET | Freq: Two times a day (BID) | ORAL | Status: DC

## 2013-04-25 NOTE — Telephone Encounter (Signed)
[facility] called and states that they cannot fill the cefTRIAXone (ROCEPHIN) 250 MG injection rx they just received. They do not have this type of rx.

## 2013-04-25 NOTE — Progress Notes (Signed)
   Subjective:    Patient ID: Travis Hicks, male    DOB: 25-Jul-1950, 63 y.o.   MRN: [id number]  HPI  Symptoms began 04/19/13 as urgency and frequency. He also noted some dysuria as well as incomplete voiding throughout that night.  On 3/1 the urgency was associated with slight incontinence.  He is also noted pain and swelling in the left testicle &  in the left lower quadrant of his abdomen  The pain is described as throbbing and intermittent. Lying supine does decrease the discomfort; walking or sitting makes it worse. He is not believe that the pain will allow him to work.  His urine is dark which is a chronic finding.  He has a past history of evaluation on 2 occasions for hematuria. No diagnosis was made     Review of Systems  Last night he felt hot but he does not have definite fever, chills, or sweats  He is not having constipation, diarrhea, melena, or rectal bleeding.  There has been no definite pyuria.     Objective:   Physical Exam   He appears thin but adequately nourished.  Has no lymphadenopathy about the neck, axilla, or inguinal areas. He limits exam of the left inguinal area due to pain in the scrotal area  Heart rhythm is regular  There is no increased work of breathing but he has intermittent slight loose cough  Bowel sounds are active. He has no organomegaly or masses. There is no tenderness in the left lower quadrant to deep palpation.  Right testicle is unremarkable. Exam of the left scrotum testicle is limited because of exquisite tenderness. As above there appears to be swelling  No suspicious rashes or lesions          Assessment & Plan:  #1 epididymitis #2 history of recurrent microscopic hematuria Plan :see orders based on review of Up to Date references. He was quite concerned about the parenteral antibiotic injection and reticent to have it administered. I explained that this was the recommendation of the infectious disease specialist  and the this should result in a  more rapid resolution of the symptomatology.

## 2013-04-25 NOTE — Patient Instructions (Addendum)
Fill tub with hot water and soak in it twice a day to help relieve the testicular pain.Drink as much nondairy fluids as possible. Avoid spicy foods or alcohol as  these may aggravate the symptoms.. Do not take decongestants. Avoid narcotics if possible.  Please remain out of work until 04/27/2013    Note: He called back inquiring whether he can have intercourse with his present condition. This was somewhat surprising in view of the profound tenderness suggested on exam. I did recommend that a condom be used if he has intercourse until completion of the doxycycline.

## 2013-04-25 NOTE — Telephone Encounter (Signed)
Pt phoned requesting to know if it would be okay (given this morning's dx) if he had intercourse (concerned with transmission to spouse).  Educated patient that epididymitis was simply inflammation of that tissue/gland.  Please advise if further instruction/education needed for patient.

## 2013-04-25 NOTE — Telephone Encounter (Signed)
Rochephin was given in office. Doxycycline sent to pharmacy

## 2013-04-25 NOTE — Telephone Encounter (Signed)
Condom should be used until antibiotics taken @ least 10 days

## 2013-04-25 NOTE — Telephone Encounter (Signed)
He saw [facility] this morning.

## 2013-04-25 NOTE — Telephone Encounter (Signed)
Phoned & LMTCB for patient.  Also, please advise regarding the Rocephin IM that was sent to pharmacy. (intended to be PO?)

## 2013-04-25 NOTE — Progress Notes (Signed)
Pre visit review using our clinic review tool, if applicable. No additional management support is needed unless otherwise documented below in the visit note. 

## 2013-04-28 ENCOUNTER — Telehealth: Payer: Self-pay | Admitting: *Deleted

## 2013-04-28 NOTE — Telephone Encounter (Signed)
[facility] Triage Call Report Triage Record Num: [id number] Operator: Helene Kelp Patient Name: Kamryn Gauthier Call Date & Time: 04/25/2013 7:50:24PM Patient Phone: [phone] PCP: Walker Kehr Patient Gender: Male PCP Fax : [phone] Patient DOB: 27-May-1950 Practice Name: [facility] Reason for Call: Caller: [place]/Patient; PCP: Walker Kehr (Adults only); CB#: [phone]; Call regarding returning call back; Note: Seen this morning (04/25/13) around 0800 by Dr. Linna Darner, given Rocephin injection, had a bacterial infection, later was called but he missed it; In looking at pt. chart, noticed he was called around 1500 regarding his antibiotics, apparently a Rx for the Rocephin was sent but that is what was given in the office, note by Dr. Linna Darner was seen stating: "Rochephin was given in office. Doxycycline sent to pharmacy"; Pt. was informed of same and directions given for his antibiotics, states he did not get them today due to the confusion but will get them in the morning and start on them; Triage not needed; Note to office. Protocol(s) Used: Office Note Recommended Outcome per Protocol: Information Noted and Sent to Office Reason for Outcome: Caller information to office

## 2013-04-29 ENCOUNTER — Other Ambulatory Visit: Payer: Self-pay | Admitting: Internal Medicine

## 2013-04-29 DIAGNOSIS — N390 Urinary tract infection, site not specified: Principal | ICD-10-CM

## 2013-04-29 DIAGNOSIS — B9620 Unspecified Escherichia coli [E. coli] as the cause of diseases classified elsewhere: Secondary | ICD-10-CM

## 2013-04-29 DIAGNOSIS — N451 Epididymitis: Secondary | ICD-10-CM

## 2013-04-29 LAB — URINE CULTURE

## 2013-04-29 MED ORDER — CIPROFLOXACIN HCL 500 MG PO TABS: 500.0000 mg | ORAL_TABLET | Freq: Two times a day (BID) | ORAL | Status: DC

## 2013-05-06 ENCOUNTER — Encounter: Payer: Self-pay | Admitting: Internal Medicine

## 2013-05-06 ENCOUNTER — Other Ambulatory Visit (INDEPENDENT_AMBULATORY_CARE_PROVIDER_SITE_OTHER): Payer: Federal, State, Local not specified - PPO

## 2013-05-06 ENCOUNTER — Ambulatory Visit (INDEPENDENT_AMBULATORY_CARE_PROVIDER_SITE_OTHER): Payer: Federal, State, Local not specified - PPO | Admitting: Internal Medicine

## 2013-05-06 VITALS — BP 130/90 | HR 72 | Temp 98.4°F | Resp 16 | Wt 167.0 lb

## 2013-05-06 DIAGNOSIS — B9620 Unspecified Escherichia coli [E. coli] as the cause of diseases classified elsewhere: Secondary | ICD-10-CM

## 2013-05-06 DIAGNOSIS — N451 Epididymitis: Secondary | ICD-10-CM

## 2013-05-06 DIAGNOSIS — E559 Vitamin D deficiency, unspecified: Secondary | ICD-10-CM

## 2013-05-06 DIAGNOSIS — A498 Other bacterial infections of unspecified site: Secondary | ICD-10-CM

## 2013-05-06 DIAGNOSIS — N390 Urinary tract infection, site not specified: Secondary | ICD-10-CM

## 2013-05-06 DIAGNOSIS — N453 Epididymo-orchitis: Secondary | ICD-10-CM

## 2013-05-06 LAB — URINALYSIS, ROUTINE W REFLEX MICROSCOPIC
Bilirubin Urine: NEGATIVE
Ketones, ur: NEGATIVE
Leukocytes, UA: NEGATIVE
NITRITE: NEGATIVE
Total Protein, Urine: NEGATIVE
URINE GLUCOSE: NEGATIVE
UROBILINOGEN UA: 0.2 (ref 0.0–1.0)
pH: 5.5 (ref 5.0–8.0)

## 2013-05-06 NOTE — Progress Notes (Deleted)
Pre visit review using our clinic review tool, if applicable. No additional management support is needed unless otherwise documented below in the visit note. 

## 2013-05-06 NOTE — Assessment & Plan Note (Signed)
On Cipro Support underwear

## 2013-05-06 NOTE — Progress Notes (Signed)
Patient ID: Travis Hicks, male   DOB: [birth year], 63 y.o.   MRN: [id number]  Subjective:    HPI  F/u epididymitis and a UTI - better on Cipro  F/u abn abd CT, hematuria. C/o abd discomfort off and on   BP Readings from Last 3 Encounters:  05/06/13 130/90  04/25/13 130/80  01/14/13 130/92   Wt Readings from Last 3 Encounters:  05/06/13 167 lb (75.751 kg)  04/25/13 164 lb 6.4 oz (74.571 kg)  01/14/13 168 lb (76.204 kg)       Review of Systems  Constitutional: Negative for appetite change, fatigue and unexpected weight change.  HENT: Negative for congestion, dental problem, nosebleeds, sneezing, sore throat and trouble swallowing.   Eyes: Negative for itching and visual disturbance.  Respiratory: Positive for cough. Negative for apnea, shortness of breath, wheezing and stridor.   Cardiovascular: Negative for chest pain, palpitations and leg swelling.  Gastrointestinal: Negative for nausea, diarrhea, blood in stool and abdominal distention.  Genitourinary: Negative for frequency and hematuria.  Musculoskeletal: Negative for back pain, gait problem, joint swelling and neck pain.  Skin: Negative for rash.  Neurological: Negative for dizziness, tremors, speech difficulty and weakness.  Psychiatric/Behavioral: Negative for suicidal ideas, sleep disturbance, dysphoric mood and agitation. The patient is not nervous/anxious.        Objective:   Physical Exam  Constitutional: He is oriented to person, place, and time. He appears well-developed and well-nourished. No distress.  HENT:  Head: Normocephalic and atraumatic.  Nose: Nose normal.  Mouth/Throat: Oropharynx is clear and moist. No oropharyngeal exudate.  B wax  Eyes: Conjunctivae and EOM are normal. Pupils are equal, round, and reactive to light. Right eye exhibits no discharge. Left eye exhibits no discharge. No scleral icterus.  Neck: Normal range of motion. Neck supple. No JVD present. No tracheal deviation present. No  thyromegaly present.  Cardiovascular: Normal rate, regular rhythm, normal heart sounds and intact distal pulses.  Exam reveals no gallop and no friction rub.   No murmur heard. Pulmonary/Chest: Effort normal and breath sounds normal. No stridor. No respiratory distress. He has no wheezes. He has no rales. He exhibits no tenderness.  Abdominal: Soft. Bowel sounds are normal. He exhibits no distension and no mass. There is no tenderness. There is no rebound and no guarding.  Genitourinary: Penis normal.  Pt declined rectal exam  Musculoskeletal: Normal range of motion. He exhibits no edema and no tenderness.  Lymphadenopathy:    He has no cervical adenopathy.  Neurological: He is alert and oriented to person, place, and time. He has normal reflexes. No cranial nerve deficit. He exhibits normal muscle tone. Coordination normal.  Skin: Skin is warm and dry. No rash noted. He is not diaphoretic. No erythema. No pallor.  Psychiatric: He has a normal mood and affect. His behavior is normal. Judgment and thought content normal.   EKG OK       Assessment & Plan:

## 2013-05-06 NOTE — Assessment & Plan Note (Signed)
Re-start Rx 

## 2013-05-06 NOTE — Assessment & Plan Note (Signed)
Support underwear Cipro UA

## 2013-05-07 LAB — GC/CHLAMYDIA PROBE AMP, URINE
Chlamydia, Swab/Urine, PCR: NEGATIVE
GC Probe Amp, Urine: NEGATIVE

## 2013-07-12 ENCOUNTER — Encounter (HOSPITAL_COMMUNITY): Payer: Self-pay | Admitting: Emergency Medicine

## 2013-07-12 ENCOUNTER — Emergency Department (HOSPITAL_COMMUNITY): Payer: Federal, State, Local not specified - PPO

## 2013-07-12 ENCOUNTER — Emergency Department (HOSPITAL_COMMUNITY)
Admission: EM | Admit: 2013-07-12 | Discharge: 2013-07-12 | Disposition: A | Discharge status: Home / Self Care | Payer: Federal, State, Local not specified - PPO | Attending: Emergency Medicine | Admitting: Emergency Medicine

## 2013-07-12 DIAGNOSIS — N453 Epididymo-orchitis: Secondary | ICD-10-CM | POA: Insufficient documentation

## 2013-07-12 DIAGNOSIS — N451 Epididymitis: Secondary | ICD-10-CM

## 2013-07-12 DIAGNOSIS — Z79899 Other long term (current) drug therapy: Secondary | ICD-10-CM | POA: Insufficient documentation

## 2013-07-12 DIAGNOSIS — Z7982 Long term (current) use of aspirin: Secondary | ICD-10-CM | POA: Insufficient documentation

## 2013-07-12 DIAGNOSIS — K4090 Unilateral inguinal hernia, without obstruction or gangrene, not specified as recurrent: Secondary | ICD-10-CM | POA: Insufficient documentation

## 2013-07-12 DIAGNOSIS — Z792 Long term (current) use of antibiotics: Secondary | ICD-10-CM | POA: Insufficient documentation

## 2013-07-12 DIAGNOSIS — F17200 Nicotine dependence, unspecified, uncomplicated: Secondary | ICD-10-CM | POA: Insufficient documentation

## 2013-07-12 DIAGNOSIS — R000 Tachycardia, unspecified: Secondary | ICD-10-CM | POA: Insufficient documentation

## 2013-07-12 LAB — CBC
HCT: 40.8 % (ref 39.0–52.0)
Hemoglobin: 13.8 g/dL (ref 13.0–17.0)
MCH: 30.3 pg (ref 26.0–34.0)
MCHC: 33.8 g/dL (ref 30.0–36.0)
MCV: 89.5 fL (ref 78.0–100.0)
Platelets: 300 10*3/uL (ref 150–400)
RBC: 4.56 MIL/uL (ref 4.22–5.81)
RDW: 14 % (ref 11.5–15.5)
WBC: 10.5 10*3/uL (ref 4.0–10.5)

## 2013-07-12 LAB — BASIC METABOLIC PANEL
BUN: 9 mg/dL (ref 6–23)
CO2: 27 mEq/L (ref 19–32)
Calcium: 9.8 mg/dL (ref 8.4–10.5)
Chloride: 95 mEq/L — ABNORMAL LOW (ref 96–112)
Creatinine, Ser: 0.87 mg/dL (ref 0.50–1.35)
GFR calc non Af Amer: >90 mL/min (ref >90)
Glucose, Bld: 103 mg/dL — ABNORMAL HIGH (ref 70–99)
POTASSIUM: 3.5 meq/L — AB (ref 3.7–5.3)
Sodium: 137 mEq/L (ref 137–147)

## 2013-07-12 MED ORDER — IOHEXOL 300 MG/ML  SOLN
25.0000 mL | Freq: Once | INTRAMUSCULAR | Status: AC | PRN
  Administered 2013-07-12: 25 mL via ORAL

## 2013-07-12 MED ORDER — MORPHINE SULFATE 4 MG/ML IJ SOLN
4.0000 mg | Freq: Once | INTRAMUSCULAR | Status: DC
  Filled 2013-07-12: qty 1

## 2013-07-12 MED ORDER — IOHEXOL 300 MG/ML  SOLN
100.0000 mL | Freq: Once | INTRAMUSCULAR | Status: AC | PRN
  Administered 2013-07-12: 100 mL via INTRAVENOUS

## 2013-07-12 NOTE — ED Notes (Signed)
Pt. Returned from CT.

## 2013-07-12 NOTE — ED Provider Notes (Signed)
CSN: [id number]     Arrival date & time 07/12/13  1854 History   First MD Initiated Contact with Patient 07/12/13 1905     Chief Complaint  Patient presents with  . Testicle Pain     (Consider location/radiation/quality/duration/timing/severity/associated sxs/prior Treatment) HPI Comments: 63 year old male presents to the emergency department complaining of worsening right-sided testicular pain and swelling x4 days. Patient was seen at [facility] on 5/20 and was diagnosed with epididymitis, prescribed cefixime and norco. Patient states he has mild temporary relief with Norco, and once it wears off the pain immediately returns. He describes the pain as sharp and severe, worse when he is standing or walking. He notices swelling to the area when he stands. States he mentioned pain in his groin while at [place], however they did not make any note of it. He had a scrotal ultrasound which showed epididymitis. Admits to increased urinary frequency, urgency and hematuria. Denies fever, chills, nausea or vomiting.  Patient is a 63 y.o. male presenting with testicular pain. The history is provided by the patient.  Testicle Pain    History reviewed. No pertinent past medical history. History reviewed. No pertinent past surgical history. Family History  Problem Relation Age of Onset  . Cancer Father 59    pancr ca   History  Substance Use Topics  . Smoking status: Current Every Day Smoker  . Smokeless tobacco: Not on file  . Alcohol Use: Yes     Comment: 1 pint a week    Review of Systems  Genitourinary: Positive for dysuria, urgency, frequency, hematuria, scrotal swelling and testicular pain.  All other systems reviewed and are negative.     Allergies  Review of patient's allergies indicates no known allergies.  Home Medications   Prior to Admission medications   Medication Sig Start Date End Date Taking? Authorizing Provider  aspirin 81 MG tablet Take 81 mg by mouth daily.   Yes  Historical Provider, MD  b complex vitamins tablet Take 1 tablet by mouth daily. 01/14/13  Yes Evie Lacks Plotnikov, MD  cefixime (SUPRAX) 400 MG tablet Take 400 mg by mouth daily.   Yes Historical Provider, MD  cholecalciferol (VITAMIN D) 1000 UNITS tablet Take 1 tablet (1,000 Units total) by mouth daily. 01/14/13 01/14/14 Yes Evie Lacks Plotnikov, MD  HYDROcodone-acetaminophen (NORCO) 10-325 MG per tablet Take 1 tablet by mouth every 6 (six) hours as needed. For pain 07/09/13  Yes Historical Provider, MD   BP 131/96  Pulse 107  Temp(Src) 98.2 F (36.8 C) (Oral)  Resp 18  SpO2 97% Physical Exam  Nursing note and vitals reviewed. Constitutional: He is oriented to person, place, and time. He appears well-developed and well-nourished. No distress.  HENT:  Head: Normocephalic and atraumatic.  Mouth/Throat: Oropharynx is clear and moist.  Eyes: Conjunctivae are normal.  Neck: Normal range of motion. Neck supple.  Cardiovascular: Regular rhythm and normal heart sounds.  Tachycardia present.   Pulmonary/Chest: Effort normal and breath sounds normal.  Abdominal: Soft. Normal appearance and bowel sounds are normal. There is no tenderness. A hernia is present. Hernia confirmed positive in the right inguinal area (tender, reducible).  Genitourinary: Penis normal. Right testis shows swelling and tenderness. Right testis shows no mass. Left testis shows no mass, no swelling and no tenderness.  Musculoskeletal: Normal range of motion. He exhibits no edema.  Neurological: He is alert and oriented to person, place, and time.  Skin: Skin is warm and dry. He is not diaphoretic.  Psychiatric:  He has a normal mood and affect. His behavior is normal.    ED Course  Procedures (including critical care time) Labs Review Labs Reviewed  BASIC METABOLIC PANEL - Abnormal; Notable for the following:    Potassium 3.5 (*)    Chloride 95 (*)    Glucose, Bld 103 (*)    All other components within normal limits   CBC  URINALYSIS, ROUTINE W REFLEX MICROSCOPIC    Imaging Review Ct Abdomen Pelvis W Contrast  07/12/2013   CLINICAL DATA:  Worsening scrotal pain, with hematuria and scrotal swelling.  EXAM: CT ABDOMEN AND PELVIS WITH CONTRAST  TECHNIQUE: Multidetector CT imaging of the abdomen and pelvis was performed using the standard protocol following bolus administration of intravenous contrast.  CONTRAST:  16mL OMNIPAQUE IOHEXOL 300 MG/ML  SOLN  COMPARISON:  CT of the abdomen and pelvis performed 01/17/2013  FINDINGS: The visualized lung bases are clear.  The liver and spleen are unremarkable in appearance. The gallbladder is within normal limits. The pancreas and adrenal glands are unremarkable.  Evaluation for renal stones is suboptimal due to contrast within the renal calyces. The kidneys are grossly unremarkable in appearance, aside from an apparent tiny left renal cyst. No perinephric stranding is appreciated. There is no evidence of hydronephrosis.  No free fluid is identified. The small bowel is unremarkable in appearance. The stomach is within normal limits. No acute vascular abnormalities are seen. Mild scattered calcification is seen along the abdominal aorta and its branches.  The appendix is normal in caliber and contains air, without evidence for appendicitis. The colon is unremarkable in appearance.  The bladder is mildly distended and grossly unremarkable in appearance. There is mild soft tissue inflammation about the seminal vesicles bilaterally, raising concern for some degree of underlying infection. The prostate is grossly unremarkable in appearance. No inguinal lymphadenopathy is seen.  The scrotum is not imaged on this study.  No acute osseous abnormalities are identified. Facet disease is noted at the lower lumbar spine.  IMPRESSION: 1. The scrotum is not imaged on this study. Depending on the degree of clinical concern, scrotal ultrasound could be performed for further evaluation. 2. Mild soft  tissue inflammation noted about the seminal vesicles bilaterally, raising concern for some degree of underlying infection. This may reflect superior extension from the patient's scrotal symptoms.   Electronically Signed   By: Garald Balding M.D.   On: 07/12/2013 22:32     EKG Interpretation None      MDM   Final diagnoses:  Epididymitis  Right inguinal hernia   Patient presenting with continued testicular pain and swelling after being seen at [facility] 3 days ago. He had a scrotal ultrasound at that time which showed epididymitis. He did not make any mention of his groin pain. Palpable inguinal hernia on exam. Reducible. Afebrile. Tachycardic, vitals otherwise stable. Plan to obtain labs, CT abdomen/pelvis with contrast for further evaluation of hernia given significance of pain, and will obtain records from visit at [facility]. Pt refusing pain meds at this time. 11:13 PM CT scan showing mild soft tissue inflammation noted about the seminal vesicles bilaterally, raising concern over some degree of underlying infection, this is consistent with patient's diagnosis of epididymitis. Records from [facility] reviewed, urinalysis positive for infection, urine culture obtained. His REM antibiotics for this. Scrotal ultrasound shows epididymis increased in size bilaterally and has increased flow concerning for epididymitis. Vitals remain stable. Pt is stable for d/c. I advised him to continue antibiotics and  f/u with urology. He sees Dr. Janice Norrie. States he has enough pain medication at home. Return precautions given. Patient states understanding of treatment care plan and is agreeable.  Illene Labrador, PA-C 07/12/13 [place], PA-C 07/12/13 [phone]

## 2013-07-12 NOTE — ED Provider Notes (Signed)
Medical screening examination/treatment/procedure(s) were performed by non-physician practitioner and as supervising physician I was immediately available for consultation/collaboration.  Neta Ehlers, MD 07/12/13 [phone]

## 2013-07-12 NOTE — ED Notes (Addendum)
Pt reports going to [facility] on 5/20 for testicular pain and was diagnosed with epididymitis and given prescription for pain meds and antibiotics. Reports no relief, having pain, blood in urine and increase in swelling.

## 2013-07-12 NOTE — Discharge Instructions (Signed)
Epididymitis Epididymitis is a swelling (inflammation) of the epididymis. The epididymis is a cord-like structure along the back part of the testicle. Epididymitis is usually, but not always, caused by infection. This is usually a sudden problem beginning with chills, fever and pain behind the scrotum and in the testicle. There may be swelling and redness of the testicle. DIAGNOSIS  Physical examination will reveal a tender, swollen epididymis. Sometimes, cultures are obtained from the urine or from prostate secretions to help find out if there is an infection or if the cause is a different problem. Sometimes, blood work is performed to see if your white blood cell count is elevated and if a germ (bacterial) or viral infection is present. Using this knowledge, an appropriate medicine which kills germs (antibiotic) can be chosen by your caregiver. A viral infection causing epididymitis will most often go away (resolve) without treatment. HOME CARE INSTRUCTIONS   Hot sitz baths for 20 minutes, 4 times per day, may help relieve pain.  Only take over-the-counter or prescription medicines for pain, discomfort or fever as directed by your caregiver.  Take all medicines, including antibiotics, as directed. Take the antibiotics for the full prescribed length of time even if you are feeling better.  It is very important to keep all follow-up appointments. SEEK IMMEDIATE MEDICAL CARE IF:   You have a fever.  You have pain not relieved with medicines.  You have any worsening of your problems.  Your pain seems to come and go.  You develop pain, redness, and swelling in the scrotum and surrounding areas. MAKE SURE YOU:   Understand these instructions.  Will watch your condition.  Will get help right away if you are not doing well or get worse. Document Released: 02/04/2000 Document Revised: 05/01/2011 Document Reviewed: 12/24/2008 [facility] Patient Information 2014 [place], [place].  Hernia A  hernia occurs when an internal organ pushes out through a weak spot in the abdominal wall. Hernias most commonly occur in the groin and around the navel. Hernias often can be pushed back into place (reduced). Most hernias tend to get worse over time. Some abdominal hernias can get stuck in the opening (irreducible or incarcerated hernia) and cannot be reduced. An irreducible abdominal hernia which is tightly squeezed into the opening is at risk for impaired blood supply (strangulated hernia). A strangulated hernia is a medical emergency. Because of the risk for an irreducible or strangulated hernia, surgery may be recommended to repair a hernia. CAUSES   Heavy lifting.  Prolonged coughing.  Straining to have a bowel movement.  A cut (incision) made during an abdominal surgery. HOME CARE INSTRUCTIONS   Bed rest is not required. You may continue your normal activities.  Avoid lifting more than 10 pounds (4.5 kg) or straining.  Cough gently. If you are a smoker it is best to stop. Even the best hernia repair can break down with the continual strain of coughing. Even if you do not have your hernia repaired, a cough will continue to aggravate the problem.  Do not wear anything tight over your hernia. Do not try to keep it in with an outside bandage or truss. These can damage abdominal contents if they are trapped within the hernia sac.  Eat a normal diet.  Avoid constipation. Straining over long periods of time will increase hernia size and encourage breakdown of repairs. If you cannot do this with diet alone, stool softeners may be used. SEEK IMMEDIATE MEDICAL CARE IF:   You have a fever.  You develop increasing abdominal pain.  You feel nauseous or vomit.  Your hernia is stuck outside the abdomen, looks discolored, feels hard, or is tender.  You have any changes in your bowel habits or in the hernia that are unusual for you.  You have increased pain or swelling around the  hernia.  You cannot push the hernia back in place by applying gentle pressure while lying down. MAKE SURE YOU:   Understand these instructions.  Will watch your condition.  Will get help right away if you are not doing well or get worse. Document Released: 02/06/2005 Document Revised: 05/01/2011 Document Reviewed: 09/26/2007 [facility] Patient Information 2014 [place].

## 2013-07-14 ENCOUNTER — Emergency Department (HOSPITAL_COMMUNITY)
Admission: EM | Admit: 2013-07-14 | Discharge: 2013-07-15 | Disposition: A | Discharge status: Home / Self Care | Payer: Federal, State, Local not specified - PPO | Attending: Emergency Medicine | Admitting: Emergency Medicine

## 2013-07-14 ENCOUNTER — Emergency Department (HOSPITAL_COMMUNITY): Payer: Federal, State, Local not specified - PPO

## 2013-07-14 ENCOUNTER — Encounter (HOSPITAL_COMMUNITY): Payer: Self-pay | Admitting: Emergency Medicine

## 2013-07-14 DIAGNOSIS — Z792 Long term (current) use of antibiotics: Secondary | ICD-10-CM | POA: Insufficient documentation

## 2013-07-14 DIAGNOSIS — N451 Epididymitis: Secondary | ICD-10-CM

## 2013-07-14 DIAGNOSIS — R000 Tachycardia, unspecified: Secondary | ICD-10-CM | POA: Insufficient documentation

## 2013-07-14 DIAGNOSIS — Z7982 Long term (current) use of aspirin: Secondary | ICD-10-CM | POA: Insufficient documentation

## 2013-07-14 DIAGNOSIS — N453 Epididymo-orchitis: Secondary | ICD-10-CM | POA: Insufficient documentation

## 2013-07-14 DIAGNOSIS — Z79899 Other long term (current) drug therapy: Secondary | ICD-10-CM | POA: Insufficient documentation

## 2013-07-14 DIAGNOSIS — F17200 Nicotine dependence, unspecified, uncomplicated: Secondary | ICD-10-CM | POA: Insufficient documentation

## 2013-07-14 DIAGNOSIS — I861 Scrotal varices: Secondary | ICD-10-CM | POA: Insufficient documentation

## 2013-07-14 MED ORDER — MORPHINE SULFATE 4 MG/ML IJ SOLN
4.0000 mg | Freq: Once | INTRAMUSCULAR | Status: AC
  Administered 2013-07-14: 4 mg via INTRAVENOUS
  Filled 2013-07-14: qty 1

## 2013-07-14 NOTE — ED Notes (Signed)
Pt states his symptoms started last Monday with burning while urination. pt states that he was seen Wed at [place] and diagnosed with epidydimitis. Pt states that he also got a 21 days antibiotics for UTI. Pt states that he has been taking his antibiotics and that Sat. He was seen for similar symptoms and had testing done here. Pt states that there was no changes to his medications. Pt states that he is still having swelling in his testicles.

## 2013-07-14 NOTE — ED Notes (Signed)
Pt transported to radiology.

## 2013-07-14 NOTE — ED Provider Notes (Signed)
CSN: [id number]     Arrival date & time 07/14/13  2115 History  This chart was scribed for non-physician practitioner Mellody Drown, PA-C working with Juliet Rude. Rubin Payor, MD by Joaquin Music, ED Scribe. This patient was seen in room TR10C/TR10C and the patient's care was started at 10:29 PM .   Chief Complaint  Patient presents with  . Groin Swelling   HPI Comments: Travis Hicks is a 63 y.o. male who presents to the Emergency Department complaining of ongoing worsening groin/testicular swelling and pain that began a few days ago. Pt was seen at [facility] 07/09/2013 and was seen a few days ago at [facility] for abdominal CT. He states his swelling to testicles has worsened. States his pain worsens when he stands but not while seated. He states April 2015, he had similar sx and reports receiving a Rocephin shot and Cipro by Dr. Wende Mott he had relief within 2 days. He states his swelling has not improved. Urologist is Dr. Brunilda Payor and is scheduled for F/U apt 08/01/2013. Still c/o lower abd/pelvic pain. States he has intermittent dysuria. States 3 days ago, reports having hematuria at the end of the urine stream. States he is sexual active with his wife. Hx of Chlamydia 20 years ago and trichomonas 15 years ago. Reports taking Hydrocodone for pain but denies having pain while seated. Denies nausea, emesis, and fever.  The history is provided by the patient. No language interpreter was used.    History reviewed. No pertinent past medical history. History reviewed. No pertinent past surgical history. Family History  Problem Relation Age of Onset  . Cancer Father 9    pancr ca   History  Substance Use Topics  . Smoking status: Current Every Day Smoker  . Smokeless tobacco: Not on file  . Alcohol Use: Yes     Comment: 1 pint a week    Review of Systems  Constitutional: Negative for fever and chills.  Gastrointestinal: Positive for abdominal pain. Negative for nausea, vomiting and  constipation.  Genitourinary: Positive for dysuria, hematuria, penile swelling, scrotal swelling and testicular pain. Negative for discharge and genital sores.  All other systems reviewed and are negative.  Allergies  Review of patient's allergies indicates no known allergies.  Home Medications   Prior to Admission medications   Medication Sig Start Date End Date Taking? Authorizing Provider  aspirin 81 MG tablet Take 81 mg by mouth daily.    Historical Provider, MD  b complex vitamins tablet Take 1 tablet by mouth daily. 01/14/13   Georgina Quint Plotnikov, MD  cefixime (SUPRAX) 400 MG tablet Take 400 mg by mouth daily.    Historical Provider, MD  cholecalciferol (VITAMIN D) 1000 UNITS tablet Take 1 tablet (1,000 Units total) by mouth daily. 01/14/13 01/14/14  Georgina Quint Plotnikov, MD  HYDROcodone-acetaminophen (NORCO) 10-325 MG per tablet Take 1 tablet by mouth every 6 (six) hours as needed. For pain 07/09/13   Historical Provider, MD   BP 133/90  Pulse 107  Temp(Src) 98.1 F (36.7 C) (Oral)  Resp 20  Ht 6\' 1"  (1.854 m)  Wt 163 lb 3 oz (74.021 kg)  BMI 21.53 kg/m2  SpO2 95%  Physical Exam  Nursing note and vitals reviewed. Constitutional: He is oriented to person, place, and time. He appears well-developed and well-nourished.  Non-toxic appearance. He does not have a sickly appearance. He does not appear ill. No distress.  Appears uncomfortable  HENT:  Head: Normocephalic and atraumatic.  Eyes: Conjunctivae and EOM are  normal. Pupils are equal, round, and reactive to light. No scleral icterus.  Neck: Normal range of motion. Neck supple.  Cardiovascular: Regular rhythm.  Tachycardia present.   Pulmonary/Chest: Effort normal. No respiratory distress.  Abdominal: Soft. Normal appearance. There is tenderness in the suprapubic area. There is guarding. There is no rigidity and no rebound.    Genitourinary: Right testis shows mass, swelling and tenderness. Left testis shows mass, swelling  and tenderness. No discharge found.  Large R scrotal mass, hard, exquisitely tender to palpation Moderate amount of swelling. Moderate tender to palpation of left testicle and "bag of worms" feel to left scrotum with associated swelling. Chaperone present.    Musculoskeletal: Normal range of motion. He exhibits no edema.  Neurological: He is alert and oriented to person, place, and time.  Skin: Skin is warm and dry. He is not diaphoretic.  Psychiatric: He has a normal mood and affect. His behavior is normal. Thought content normal.    ED Course  Procedures (including critical care time) Labs Review Labs Reviewed  URINALYSIS, ROUTINE W REFLEX MICROSCOPIC - Abnormal; Notable for the following:    Color, Urine AMBER (*)    APPearance TURBID (*)    Hgb urine dipstick LARGE (*)    Bilirubin Urine SMALL (*)    Leukocytes, UA MODERATE (*)    All other components within normal limits  URINE MICROSCOPIC-ADD ON - Abnormal; Notable for the following:    Bacteria, UA FEW (*)    All other components within normal limits  URINE CULTURE    Imaging Review US Scrotum  07/15/2013   CLINICAL DATA:  Right testicular pain and swelling. Known epididymitis, on antibiotics for 5 days.  EXAM: SCROTAL ULTRASOUND  DOPPLER ULTRASOUND OF THE TESTICLES  TECHNIQUE: Complete ultrasound examination of the testicles, epididymis, and other scrotal structures was performed. Color and spectral Doppler ultrasound were also utilized to evaluate blood flow to the testicles.  COMPARISON:  None.  FINDINGS: Right testicle  Measurements: 3.2 x 2.1 x 2.5 cm. No mass or microlithiasis visualized.  Left testicle  Measurements: 3.5 x 1.6 x 2.4 cm. No mass or microlithiasis visualized.  Right epididymis: Diffusely enlarged and heterogeneous in appearance, measuring 6.9 x 3.6 x 3.9 cm, with diffusely increased blood flow on limited Doppler evaluation. Partially cystic areas are noted within the epididymis.  Left epididymis:  Normal in  size and appearance.  Hydrocele: A moderate to large right-sided hydrocele is noted, with several septations seen.  Varicocele: A left-sided varicocele is noted; this may be partially thrombosed, though some degree of augmentation is noted on Valsalva maneuver.  Pulsed Doppler interrogation of both testes demonstrates low resistance arterial and venous waveforms bilaterally.  Note is made of asymmetric right-sided scrotal skin thickening.  IMPRESSION: 1. Relatively severe right-sided epididymitis, with diffuse enlargement and heterogeneity of the right epididymis, significant hyperemia and partially cystic areas within the epididymis. 2. Associated moderate to large complex right-sided hydrocele, with several septations. 3. No evidence of orchitis or testicular torsion. The testes are unremarkable in appearance. 4. Left-sided varicocele noted; this may be partially thrombosed, given its appearance.   Electronically Signed   By: Garald Balding M.D.   On: 07/15/2013 00:13   [place] Art/ven Flow Abd Pelv Doppler  07/15/2013   CLINICAL DATA:  Right testicular pain and swelling. Known epididymitis, on antibiotics for 5 days.  EXAM: SCROTAL ULTRASOUND  DOPPLER ULTRASOUND OF THE TESTICLES  TECHNIQUE: Complete ultrasound examination of the testicles, epididymis, and other scrotal structures was  performed. Color and spectral Doppler ultrasound were also utilized to evaluate blood flow to the testicles.  COMPARISON:  None.  FINDINGS: Right testicle  Measurements: 3.2 x 2.1 x 2.5 cm. No mass or microlithiasis visualized.  Left testicle  Measurements: 3.5 x 1.6 x 2.4 cm. No mass or microlithiasis visualized.  Right epididymis: Diffusely enlarged and heterogeneous in appearance, measuring 6.9 x 3.6 x 3.9 cm, with diffusely increased blood flow on limited Doppler evaluation. Partially cystic areas are noted within the epididymis.  Left epididymis:  Normal in size and appearance.  Hydrocele: A moderate to large right-sided hydrocele  is noted, with several septations seen.  Varicocele: A left-sided varicocele is noted; this may be partially thrombosed, though some degree of augmentation is noted on Valsalva maneuver.  Pulsed Doppler interrogation of both testes demonstrates low resistance arterial and venous waveforms bilaterally.  Note is made of asymmetric right-sided scrotal skin thickening.  IMPRESSION: 1. Relatively severe right-sided epididymitis, with diffuse enlargement and heterogeneity of the right epididymis, significant hyperemia and partially cystic areas within the epididymis. 2. Associated moderate to large complex right-sided hydrocele, with several septations. 3. No evidence of orchitis or testicular torsion. The testes are unremarkable in appearance. 4. Left-sided varicocele noted; this may be partially thrombosed, given its appearance.   Electronically Signed   By: Garald Balding M.D.   On: 07/15/2013 00:13     EKG Interpretation None     MDM   Final diagnoses:  Epididymitis  Varicocele  Patient was seen 07/09/2013 for similar complaints OSH records shows testicular US showed epididymitis and varicocele, discharged with Suprax. UA culture did not show Suprax as tested. sensative to Cipro. Seen here CT showed infectious changes in lower pelvis, no change in Antibiotic course. Presents today with worsening symptoms.  Discussed with Dr. Alvino Chapel, [place] ordered, labs ordered. US shows Relatively severe right-sided epididymitis, with diffuse enlargement and heterogeneity of the right epididymis, significant hyperemia and partially cystic areas within the epididymis. 2. Associated moderate to large complex right-sided hydrocele, with several septations. 3. No evidence of orchitis or testicular torsion. The testes are unremarkable in appearance. 4. Left-sided varicocele noted; this may be partially thrombosed. Discussed findings with Dr. Janice Norrie, advises starting Cipro and having the patient call the office tomorrow for an  earlier appointment. Discussed lab results, imaging results, and treatment plan with the patient. Return precautions given. Reports understanding and no other concerns at this time.  Patient is stable for discharge at this time. Meds given in ED:  Medications  morphine 4 MG/ML injection 4 mg (4 mg Intravenous Given 07/14/13 2319)  ciprofloxacin (CIPRO) IVPB 400 mg (0 mg Intravenous Stopped 07/15/13 0224)    Discharge Medication List as of 07/15/2013  2:18 AM    START taking these medications   Details  ciprofloxacin (CIPRO) 500 MG tablet Take 1 tablet (500 mg total) by mouth 2 (two) times daily., Starting 07/15/2013, Until Discontinued, Print    ibuprofen (ADVIL,MOTRIN) 800 MG tablet Take 1 tablet (800 mg total) by mouth 3 (three) times daily., Starting 07/15/2013, Until Discontinued, Print    oxyCODONE-acetaminophen (PERCOCET/ROXICET) 5-325 MG per tablet Take 1 tablet by mouth every 4 (four) hours as needed for severe pain. May take 2 tablets PO q 6 hours for severe pain - Do not take with Tylenol as this tablet already contains tylenol, Starting 07/15/2013, Until Discontinued, Print       I personally performed the services described in this documentation, which was scribed in my presence. The recorded  information has been reviewed and is accurate.    Lorrine Kin, PA-C 07/16/13 1436

## 2013-07-15 LAB — URINALYSIS, ROUTINE W REFLEX MICROSCOPIC
Glucose, UA: NEGATIVE mg/dL
Ketones, ur: NEGATIVE mg/dL
Nitrite: NEGATIVE
PH: 5.5 (ref 5.0–8.0)
Protein, ur: NEGATIVE mg/dL
SPECIFIC GRAVITY, URINE: 1.03 (ref 1.005–1.030)
Urobilinogen, UA: 0.2 mg/dL (ref 0.0–1.0)

## 2013-07-15 LAB — URINE MICROSCOPIC-ADD ON

## 2013-07-15 MED ORDER — IBUPROFEN 800 MG PO TABS: 800.0000 mg | ORAL_TABLET | Freq: Three times a day (TID) | ORAL | Status: DC

## 2013-07-15 MED ORDER — CIPROFLOXACIN HCL 500 MG PO TABS: 500.0000 mg | ORAL_TABLET | Freq: Two times a day (BID) | ORAL | Status: DC

## 2013-07-15 MED ORDER — OXYCODONE-ACETAMINOPHEN 5-325 MG PO TABS: 1.0000 | ORAL_TABLET | ORAL | Status: DC | PRN

## 2013-07-15 MED ORDER — CIPROFLOXACIN IN D5W 400 MG/200ML IV SOLN
400.0000 mg | Freq: Once | INTRAVENOUS | Status: AC
  Administered 2013-07-15: 400 mg via INTRAVENOUS
  Filled 2013-07-15: qty 200

## 2013-07-15 MED ORDER — CIPROFLOXACIN HCL 500 MG PO TABS: 500.0000 mg | ORAL_TABLET | Freq: Once | ORAL | Status: DC

## 2013-07-15 NOTE — Discharge Instructions (Signed)
[facility] your urologist in the morning for an earlier appointment. Call for a follow up appointment with a Family or Primary Care Provider.  Return if Symptoms worsen.   Take medication as prescribed.  Scrotal support is encouraged to decrease symptoms.

## 2013-07-16 ENCOUNTER — Telehealth: Payer: Self-pay | Admitting: *Deleted

## 2013-07-16 LAB — URINE CULTURE
Colony Count: NO GROWTH
Culture: NO GROWTH

## 2013-07-16 NOTE — Telephone Encounter (Signed)
[facility] Triage Call Report Triage Record Num: [id number] Operator: Kerney Elbe Patient Name: Travis Hicks Call Date & Time: 07/14/2013 8:39:46PM Patient Phone: [phone] PCP: Walker Kehr Patient Gender: Male PCP Fax : [phone] Patient DOB: [birth year] Practice Name[facility] Reason for Call: Caller: Jasiri/Patient; PCP: Walker Kehr (Adults only); CB#: [phone]; Call regarding Epididymitis; Onset 07/14/13 dxd epididymitis seen by Dr Jodi Mourning on 06/26/13 started on Doxycycline and felt better. On about 07/06/13 started having some of same sxs, seen in ED on 07/09/13 and diagnosed as epididymitis, given injection and 21 day cycle of Suprex. On 07/13/13 went to [facility] ED due to no improvement and in pain. Having pain in scrotum. Afebrile. Taking Hydrocodone without relief, last dose 1830; pain rated 9-10/10. No pain as long as lying on back w/ scrotum elevated. Emergent sxs "Unbearable pain" per Scrotum or Testicles Symptoms protocol. RN advised ED evaluation now. Caller will go to [facility]. Protocol(s) Used: Scrotum or Testicles Symptoms Recommended Outcome per Protocol: See ED Immediately Reason for Outcome: Unbearable pain Care Advice: ~ Allow the patient to be in a position of comfort. ~ Another adult should drive. For pain relief, while in transit to ED, may elevate scrotum with cloth covered ice pack - 20 minutes on then 20 minutes off until arrival at ED. ~ ~ Do not eat or drink anything until evaluated by provider. Call EMS 911 if signs and symptoms of shock develop (such as unable to stand due to faintness, dizziness, or lightheadedness; new onset of confusion; slow to respond or difficult to awaken; skin is pale, gray, cool, or moist to touch; severe weakness; loss of consciousness). ~ Write down provider's name. List or place the following in a bag for transport with the patient: current prescription and/or nonprescription medications; alternative  treatments, therapies and medications; and street drugs. ~ ~ Tell provider if you are taking any erectile dysfunction medication such as Viagra, Levitra, or Cialis. 05

## 2013-07-17 ENCOUNTER — Encounter: Payer: Self-pay | Admitting: Internal Medicine

## 2013-07-17 ENCOUNTER — Ambulatory Visit (INDEPENDENT_AMBULATORY_CARE_PROVIDER_SITE_OTHER): Payer: Federal, State, Local not specified - PPO | Admitting: Internal Medicine

## 2013-07-17 VITALS — BP 120/72 | HR 84 | Temp 97.9°F | Resp 16 | Wt 160.0 lb

## 2013-07-17 DIAGNOSIS — N453 Epididymo-orchitis: Secondary | ICD-10-CM

## 2013-07-17 DIAGNOSIS — N451 Epididymitis: Secondary | ICD-10-CM | POA: Insufficient documentation

## 2013-07-17 NOTE — Assessment & Plan Note (Signed)
resolved 

## 2013-07-17 NOTE — ED Provider Notes (Signed)
Medical screening examination/treatment/procedure(s) were performed by non-physician practitioner and as supervising physician I was immediately available for consultation/collaboration.    Johnna Acosta, MD 07/17/13 [phone]

## 2013-07-17 NOTE — Progress Notes (Signed)
Pre visit review using our clinic review tool, if applicable. No additional management support is needed unless otherwise documented below in the visit note. 

## 2013-07-17 NOTE — Assessment & Plan Note (Signed)
Dr Janice Norrie at 1 pm today L 3/15 R 5/15 s/p ER visit x3: Patient was seen 07/09/2013 for similar complaints OSH records shows testicular US showed epididymitis and varicocele, discharged with Suprax. UA culture did not show Suprax as tested. sensative to Cipro. Seen here CT showed infectious changes in lower pelvis c/w epididymitis - no change in Antibiotic course. Presented to ER on 5/25 with worsening symptoms.    US shows relatively severe right-sided epididymitis, with diffuse enlargement and heterogeneity of the right epididymis, significant hyperemia and partially cystic areas within the epididymis. 2. Associated moderate to large complex right-sided hydrocele, with several septations. 3. No evidence of orchitis or testicular torsion. The testes are unremarkable in appearance. 4. Left-sided varicocele noted; this may be partially thrombosed.  Discussed findings with Dr. Janice Norrie, advises starting Cipro on 07/14/13 and having the patient call the office tomorrow for an earlier appointment. Cipro helped

## 2013-07-17 NOTE — Progress Notes (Signed)
Subjective:    HPI He was in ER x 3  F/u epididymitis and a UTI - better on Cipro  F/u abn abd CT, hematuria. C/o abd discomfort off and on  Patient was seen 07/09/2013 for similar complaints OSH records shows testicular US showed epididymitis and varicocele, discharged with Suprax. UA culture did not show Suprax as tested. sensative to Cipro. Seen here CT showed infectious changes in lower pelvis c/w epididymitis - no change in Antibiotic course. Presented to ER on 5/25 with worsening symptoms.    US shows relatively severe right-sided epididymitis, with diffuse enlargement and heterogeneity of the right epididymis, significant hyperemia and partially cystic areas within the epididymis. 2. Associated moderate to large complex right-sided hydrocele, with several septations. 3. No evidence of orchitis or testicular torsion. The testes are unremarkable in appearance. 4. Left-sided varicocele noted; this may be partially thrombosed.  Discussed findings with Dr. Janice Norrie, advises starting Cipro on 07/14/13 and having the patient call the office tomorrow for an earlier appointment. Cipro helped    BP Readings from Last 3 Encounters:  07/17/13 120/72  07/15/13 122/87  07/12/13 125/78   Wt Readings from Last 3 Encounters:  07/17/13 160 lb (72.576 kg)  07/14/13 163 lb 3 oz (74.021 kg)  05/06/13 167 lb (75.751 kg)       Review of Systems  Constitutional: Negative for appetite change, fatigue and unexpected weight change.  HENT: Negative for congestion, dental problem, nosebleeds, sneezing, sore throat and trouble swallowing.   Eyes: Negative for itching and visual disturbance.  Respiratory: Positive for cough. Negative for apnea, shortness of breath, wheezing and stridor.   Cardiovascular: Negative for chest pain, palpitations and leg swelling.  Gastrointestinal: Negative for nausea, diarrhea, blood in stool and abdominal distention.  Genitourinary: Negative for frequency and hematuria.   Musculoskeletal: Negative for back pain, gait problem, joint swelling and neck pain.  Skin: Negative for rash.  Neurological: Negative for dizziness, tremors, speech difficulty and weakness.  Psychiatric/Behavioral: Negative for suicidal ideas, sleep disturbance, dysphoric mood and agitation. The patient is not nervous/anxious.        Objective:   Physical Exam  Constitutional: He is oriented to person, place, and time. He appears well-developed and well-nourished. No distress.  HENT:  Head: Normocephalic and atraumatic.  Nose: Nose normal.  Mouth/Throat: Oropharynx is clear and moist. No oropharyngeal exudate.  B wax  Eyes: Conjunctivae and EOM are normal. Pupils are equal, round, and reactive to light. Right eye exhibits no discharge. Left eye exhibits no discharge. No scleral icterus.  Neck: Normal range of motion. Neck supple. No JVD present. No tracheal deviation present. No thyromegaly present.  Cardiovascular: Normal rate, regular rhythm, normal heart sounds and intact distal pulses.  Exam reveals no gallop and no friction rub.   No murmur heard. Pulmonary/Chest: Effort normal and breath sounds normal. No stridor. No respiratory distress. He has no wheezes. He has no rales. He exhibits no tenderness.  Abdominal: Soft. Bowel sounds are normal. He exhibits no distension and no mass. There is no tenderness. There is no rebound and no guarding.  Genitourinary: Penis normal.  Pt declined rectal exam R testes is swollen and tender  Musculoskeletal: Normal range of motion. He exhibits no edema and no tenderness.  Lymphadenopathy:    He has no cervical adenopathy.  Neurological: He is alert and oriented to person, place, and time. He has normal reflexes. No cranial nerve deficit. He exhibits normal muscle tone. Coordination normal.  Skin: Skin is warm  and dry. No rash noted. He is not diaphoretic. No erythema. No pallor.  Psychiatric: He has a normal mood and affect. His behavior is  normal. Judgment and thought content normal.   EKG OK   ER notes reviewd    Assessment & Plan:

## 2013-07-17 NOTE — Patient Instructions (Signed)
Dr Janice Norrie 1 pm today

## 2013-07-23 ENCOUNTER — Telehealth: Payer: Self-pay | Admitting: Internal Medicine

## 2013-07-23 NOTE — Telephone Encounter (Signed)
PT STOPPED BY TO DROP OFF FMLA FORMS AND PAPERWORK OF DR NOTES FROM TIME OUT OF WORK. PT STATES HE HAS ALREADY MENTIONED THIS REQUEST TO DR PLOTNIKOV DURING LAST VISIT AND WOULD LIKE THE FORMS COMPLETED AS SOON AS POSSIBLE. PLEASE CONTACT PT WHEN REQUEST IS COMPLETE.

## 2013-07-27 NOTE — Telephone Encounter (Signed)
I signed papers on Fri before lunchtime, I believe Thx

## 2013-07-28 NOTE — Telephone Encounter (Signed)
Papers are completed and upfront for p/u. Adero left mess on 07/25/13 informing pt.

## 2013-10-16 ENCOUNTER — Encounter: Payer: Self-pay | Admitting: Gastroenterology

## 2013-10-21 ENCOUNTER — Ambulatory Visit: Payer: Federal, State, Local not specified - PPO | Admitting: Internal Medicine

## 2013-10-22 ENCOUNTER — Telehealth: Payer: Self-pay | Admitting: *Deleted

## 2013-10-22 ENCOUNTER — Ambulatory Visit (INDEPENDENT_AMBULATORY_CARE_PROVIDER_SITE_OTHER): Payer: Federal, State, Local not specified - PPO | Admitting: Internal Medicine

## 2013-10-22 ENCOUNTER — Encounter: Payer: Self-pay | Admitting: Internal Medicine

## 2013-10-22 ENCOUNTER — Other Ambulatory Visit (INDEPENDENT_AMBULATORY_CARE_PROVIDER_SITE_OTHER): Payer: Federal, State, Local not specified - PPO

## 2013-10-22 VITALS — BP 148/90 | HR 100 | Temp 98.2°F | Wt 162.0 lb

## 2013-10-22 DIAGNOSIS — B9620 Unspecified Escherichia coli [E. coli] as the cause of diseases classified elsewhere: Secondary | ICD-10-CM

## 2013-10-22 DIAGNOSIS — N453 Epididymo-orchitis: Secondary | ICD-10-CM

## 2013-10-22 DIAGNOSIS — A498 Other bacterial infections of unspecified site: Secondary | ICD-10-CM

## 2013-10-22 DIAGNOSIS — F1020 Alcohol dependence, uncomplicated: Secondary | ICD-10-CM

## 2013-10-22 DIAGNOSIS — N390 Urinary tract infection, site not specified: Secondary | ICD-10-CM

## 2013-10-22 DIAGNOSIS — N3941 Urge incontinence: Secondary | ICD-10-CM

## 2013-10-22 DIAGNOSIS — N451 Epididymitis: Secondary | ICD-10-CM

## 2013-10-22 DIAGNOSIS — R32 Unspecified urinary incontinence: Secondary | ICD-10-CM | POA: Insufficient documentation

## 2013-10-22 LAB — BASIC METABOLIC PANEL
BUN: 17 mg/dL (ref 6–23)
CHLORIDE: 101 meq/L (ref 96–112)
CO2: 24 meq/L (ref 19–32)
Calcium: 10 mg/dL (ref 8.4–10.5)
Creatinine, Ser: 1 mg/dL (ref 0.4–1.5)
GFR: 96.98 mL/min (ref >60.00)
Glucose, Bld: 71 mg/dL (ref 70–99)
Potassium: 5 mEq/L (ref 3.5–5.1)
Sodium: 138 mEq/L (ref 135–145)

## 2013-10-22 LAB — CBC WITH DIFFERENTIAL/PLATELET
BASOS ABS: 0 10*3/uL (ref 0.0–0.1)
BASOS PCT: 0.3 % (ref 0.0–3.0)
EOS ABS: 0.1 10*3/uL (ref 0.0–0.7)
Eosinophils Relative: 1 % (ref 0.0–5.0)
HCT: 46.1 % (ref 39.0–52.0)
Hemoglobin: 15.4 g/dL (ref 13.0–17.0)
Lymphocytes Relative: 25.5 % (ref 12.0–46.0)
Lymphs Abs: 2.1 10*3/uL (ref 0.7–4.0)
MCHC: 33.5 g/dL (ref 30.0–36.0)
MCV: 89.9 fl (ref 78.0–100.0)
MONO ABS: 0.7 10*3/uL (ref 0.1–1.0)
Monocytes Relative: 7.8 % (ref 3.0–12.0)
NEUTROS ABS: 5.5 10*3/uL (ref 1.4–7.7)
NEUTROS PCT: 65.4 % (ref 43.0–77.0)
Platelets: 317 10*3/uL (ref 150.0–400.0)
RBC: 5.13 Mil/uL (ref 4.22–5.81)
RDW: 15.1 % (ref 11.5–15.5)
WBC: 8.4 10*3/uL (ref 4.0–10.5)

## 2013-10-22 LAB — VITAMIN B12: Vitamin B-12: 301 pg/mL (ref 211–911)

## 2013-10-22 LAB — URINALYSIS, ROUTINE W REFLEX MICROSCOPIC
Ketones, ur: 15 — AB
Leukocytes, UA: NEGATIVE
NITRITE: NEGATIVE
PH: 5 (ref 5.0–8.0)
Total Protein, Urine: NEGATIVE
Urine Glucose: NEGATIVE
Urobilinogen, UA: 0.2 (ref 0.0–1.0)

## 2013-10-22 LAB — PSA: PSA: 0.99 ng/mL (ref 0.10–4.00)

## 2013-10-22 MED ORDER — MIRABEGRON ER 25 MG PO TB24: 25.0000 mg | ORAL_TABLET | Freq: Every day | ORAL | Status: DC

## 2013-10-22 MED ORDER — MIRABEGRON ER 50 MG PO TB24: 50.0000 mg | ORAL_TABLET | Freq: Every day | ORAL | Status: DC

## 2013-10-22 NOTE — Assessment & Plan Note (Signed)
Was dry x 12 mo; now is drinking again

## 2013-10-22 NOTE — Assessment & Plan Note (Signed)
No sx's 

## 2013-10-22 NOTE — Assessment & Plan Note (Signed)
UA and Cx 

## 2013-10-22 NOTE — Assessment & Plan Note (Signed)
Labs Start Rx

## 2013-10-22 NOTE — Telephone Encounter (Signed)
Left msg on triage stating the myrbetriq saving card md gave him this am was expired. Requesting new card. Called pt inform him we didn't have anymore, but md did have 2 week sample will leave for pick-up so he can get started taking...Johny Chess

## 2013-10-22 NOTE — Progress Notes (Signed)
Subjective:    Urinary Frequency  This is a new problem. The current episode started more than 1 month ago. The problem occurs every urination. The problem has been gradually worsening. The patient is experiencing no pain. There has been no fever. There is no history of pyelonephritis. Associated symptoms include urgency. Pertinent negatives include no discharge, flank pain, frequency, hematuria or nausea. Associated symptoms comments: Incontinent w/urges. He has tried nothing for the symptoms. There is no history of catheterization.     F/u abn abd CT, hematuria. C/o abd discomfort off and on  Patient was seen 07/09/2013 for similar complaints OSH records shows testicular US showed epididymitis and varicocele, discharged with Suprax. UA culture did not show Suprax as tested. sensative to Cipro. Seen here CT showed infectious changes in lower pelvis c/w epididymitis - no change in Antibiotic course. Presented to ER on 5/25 with worsening symptoms.    US shows relatively severe right-sided epididymitis, with diffuse enlargement and heterogeneity of the right epididymis, significant hyperemia and partially cystic areas within the epididymis. 2. Associated moderate to large complex right-sided hydrocele, with several septations. 3. No evidence of orchitis or testicular torsion. The testes are unremarkable in appearance. 4. Left-sided varicocele noted; this may be partially thrombosed.  Discussed findings with Dr. Janice Norrie, advises starting Cipro on 07/14/13 and having the patient call the office tomorrow for an earlier appointment. Cipro helped    BP Readings from Last 3 Encounters:  10/22/13 148/90  07/17/13 120/72  07/15/13 122/87   Wt Readings from Last 3 Encounters:  10/22/13 162 lb (73.483 kg)  07/17/13 160 lb (72.576 kg)  07/14/13 163 lb 3 oz (74.021 kg)       Review of Systems  Constitutional: Negative for appetite change, fatigue and unexpected weight change.  HENT: Negative for  congestion, dental problem, nosebleeds, sneezing, sore throat and trouble swallowing.   Eyes: Negative for itching and visual disturbance.  Respiratory: Positive for cough. Negative for apnea, shortness of breath, wheezing and stridor.   Cardiovascular: Negative for chest pain, palpitations and leg swelling.  Gastrointestinal: Negative for nausea, diarrhea, blood in stool and abdominal distention.  Genitourinary: Positive for urgency. Negative for frequency, hematuria and flank pain.  Musculoskeletal: Negative for back pain, gait problem, joint swelling and neck pain.  Skin: Negative for rash.  Neurological: Negative for dizziness, tremors, speech difficulty and weakness.  Psychiatric/Behavioral: Negative for suicidal ideas, sleep disturbance, dysphoric mood and agitation. The patient is not nervous/anxious.        Objective:   Physical Exam  Constitutional: He is oriented to person, place, and time. He appears well-developed and well-nourished. No distress.  HENT:  Head: Normocephalic and atraumatic.  Nose: Nose normal.  Mouth/Throat: Oropharynx is clear and moist. No oropharyngeal exudate.  B wax  Eyes: Conjunctivae and EOM are normal. Pupils are equal, round, and reactive to light. Right eye exhibits no discharge. Left eye exhibits no discharge. No scleral icterus.  Neck: Normal range of motion. Neck supple. No JVD present. No tracheal deviation present. No thyromegaly present.  Cardiovascular: Normal rate, regular rhythm, normal heart sounds and intact distal pulses.  Exam reveals no gallop and no friction rub.   No murmur heard. Pulmonary/Chest: Effort normal and breath sounds normal. No stridor. No respiratory distress. He has no wheezes. He has no rales. He exhibits no tenderness.  Abdominal: Soft. Bowel sounds are normal. He exhibits no distension and no mass. There is no tenderness. There is no rebound and no guarding.  Genitourinary: Penis normal.  Pt declined rectal exam R  testes is swollen and tender  Musculoskeletal: Normal range of motion. He exhibits no edema and no tenderness.  Lymphadenopathy:    He has no cervical adenopathy.  Neurological: He is alert and oriented to person, place, and time. He has normal reflexes. No cranial nerve deficit. He exhibits normal muscle tone. Coordination normal.  Skin: Skin is warm and dry. No rash noted. He is not diaphoretic. No erythema. No pallor.  Psychiatric: He has a normal mood and affect. His behavior is normal. Judgment and thought content normal.   EKG OK      Assessment & Plan:

## 2013-10-22 NOTE — Progress Notes (Signed)
Pre visit review using our clinic review tool, if applicable. No additional management support is needed unless otherwise documented below in the visit note. 

## 2013-10-23 LAB — GC/CHLAMYDIA PROBE AMP, URINE
Chlamydia, Swab/Urine, PCR: NEGATIVE
GC Probe Amp, Urine: NEGATIVE

## 2013-10-23 LAB — HIV ANTIBODY (ROUTINE TESTING W REFLEX): HIV 1&2 Ab, 4th Generation: NONREACTIVE

## 2013-10-24 LAB — CULTURE, URINE COMPREHENSIVE
Colony Count: NO GROWTH
ORGANISM ID, BACTERIA: NO GROWTH

## 2013-12-11 ENCOUNTER — Encounter: Payer: Self-pay | Admitting: Gastroenterology

## 2013-12-19 ENCOUNTER — Encounter: Payer: Self-pay | Admitting: Internal Medicine

## 2013-12-19 ENCOUNTER — Ambulatory Visit (INDEPENDENT_AMBULATORY_CARE_PROVIDER_SITE_OTHER): Payer: Federal, State, Local not specified - PPO | Admitting: Internal Medicine

## 2013-12-19 VITALS — BP 132/90 | HR 78 | Temp 97.8°F | Ht 73.0 in | Wt 170.8 lb

## 2013-12-19 DIAGNOSIS — N390 Urinary tract infection, site not specified: Secondary | ICD-10-CM

## 2013-12-19 DIAGNOSIS — E559 Vitamin D deficiency, unspecified: Secondary | ICD-10-CM

## 2013-12-19 DIAGNOSIS — N451 Epididymitis: Secondary | ICD-10-CM

## 2013-12-19 DIAGNOSIS — B9620 Unspecified Escherichia coli [E. coli] as the cause of diseases classified elsewhere: Secondary | ICD-10-CM

## 2013-12-19 MED ORDER — MIRABEGRON ER 50 MG PO TB24: 50.0000 mg | ORAL_TABLET | Freq: Every day | ORAL | Status: DC

## 2013-12-19 NOTE — Assessment & Plan Note (Signed)
Continue with current prescription therapy as reflected on the Med list.  

## 2013-12-19 NOTE — Assessment & Plan Note (Signed)
UA No relapse

## 2013-12-19 NOTE — Assessment & Plan Note (Signed)
No relapse 

## 2013-12-19 NOTE — Progress Notes (Signed)
Pre visit review using our clinic review tool, if applicable. No additional management support is needed unless otherwise documented below in the visit note. 

## 2013-12-23 ENCOUNTER — Telehealth: Payer: Self-pay | Admitting: Internal Medicine

## 2013-12-23 NOTE — Telephone Encounter (Signed)
emmi emailed °

## 2014-02-03 ENCOUNTER — Ambulatory Visit (AMBULATORY_SURGERY_CENTER): Payer: Self-pay | Admitting: *Deleted

## 2014-02-03 VITALS — Ht 73.0 in | Wt 168.8 lb

## 2014-02-03 DIAGNOSIS — Z86010 Personal history of colonic polyps: Secondary | ICD-10-CM

## 2014-02-03 MED ORDER — MOVIPREP 100 G PO SOLR: 1.0000 | Freq: Once | ORAL | Status: DC

## 2014-02-03 NOTE — Progress Notes (Signed)
No egg or soy allergy. ewm No issues with past sedation but with last colon in 2007 receiver 125 mcg fent and 12 my versed. Pt states he remembers the 2004 colon and had night mare issues after the procedure. ewm No home 02 use. ewm No blood thinners, no diet pills. ewm Pt declined emmi video. ewm Pt states he has no medicine coverage with his insurance and if this prep is expensive he will not be able to afford. Pt given sample of movi prep and documented in sample folder. ewm

## 2014-02-17 ENCOUNTER — Ambulatory Visit (AMBULATORY_SURGERY_CENTER): Payer: Federal, State, Local not specified - PPO | Admitting: Gastroenterology

## 2014-02-17 ENCOUNTER — Encounter: Payer: Self-pay | Admitting: Gastroenterology

## 2014-02-17 VITALS — BP 107/75 | HR 58 | Temp 95.7°F | Resp 19 | Ht 73.0 in | Wt 168.0 lb

## 2014-02-17 DIAGNOSIS — D124 Benign neoplasm of descending colon: Secondary | ICD-10-CM

## 2014-02-17 DIAGNOSIS — Z86010 Personal history of colonic polyps: Secondary | ICD-10-CM

## 2014-02-17 MED ORDER — SODIUM CHLORIDE 0.9 % IV SOLN: 500.0000 mL | INTRAVENOUS | Status: DC

## 2014-02-17 NOTE — Patient Instructions (Signed)

## 2014-02-17 NOTE — Progress Notes (Signed)
Procedure ends, to recovery, report given and VSS. 

## 2014-02-17 NOTE — Progress Notes (Signed)
Called to room to assist during endoscopic procedure.  Patient ID and intended procedure confirmed with present staff. Received instructions for my participation in the procedure from the performing physician.  

## 2014-02-17 NOTE — Op Note (Signed)
[place]  [address] [place], [id number]   COLONOSCOPY PROCEDURE REPORT  PATIENT: Travis, Hicks  MR#: [id number] BIRTHDATE: 31-Dec-1950 , 63  yrs. old GENDER: male ENDOSCOPIST: Ladene Artist, MD, [facility] PROCEDURE DATE:  02/17/2014 PROCEDURE:   Colonoscopy with biopsy and Colonoscopy with snare polypectomy First Screening Colonoscopy - Avg.  risk and is 50 yrs.  old or older - No.  Prior Negative Screening - Now for repeat screening. N/A  History of Adenoma - Now for follow-up colonoscopy & has been > or = to 3 yrs.  Yes hx of adenoma.  Has been 3 or more years since last colonoscopy.  Polyps Removed Today? Yes. ASA CLASS:   Class II INDICATIONS:surveillance colonoscopy based on a history of adenomatous colonic polyp(s). MEDICATIONS: Monitored anesthesia care and Propofol 270 mg IV DESCRIPTION OF PROCEDURE:   After the risks benefits and alternatives of the procedure were thoroughly explained, informed consent was obtained.  The digital rectal exam revealed no abnormalities of the rectum.   The LB [id number]  endoscope was introduced through the anus and advanced to the cecum, which was identified by both the appendix and ileocecal valve. No adverse events experienced.   The quality of the prep was excellent, using MoviPrep  The instrument was then slowly withdrawn as the colon was fully examined.  COLON FINDINGS: Two sessile polyps measuring 4 mm in size were found in the descending colon.  Polypectomies were performed with cold forceps.  The resection was complete, the polyp tissue was completely retrieved and sent to histology.   A sessile polyp measuring 6 mm in size was found in the descending colon.  A polypectomy was performed with a cold snare.  The resection was complete, the polyp tissue was completely retrieved and sent to histology.   There was mild diverticulosis noted in the descending colon.   The examination was otherwise normal.   Retroflexed views revealed internal Grade II hemorrhoids. The time to cecum=3 minutes 12 seconds.  Withdrawal time=9 minutes 35 seconds.  The scope was withdrawn and the procedure completed. COMPLICATIONS: There were no immediate complications.  ENDOSCOPIC IMPRESSION: 1.   Two sessile polyps in the descending colon; polypectomies performed with cold forceps 2.   Sessile polyp in the descending colon; polypectomy performed with a cold snare 3.   Mild diverticulosis in the descending colon 4.   Grade Il internal hemorrhoids  RECOMMENDATIONS: 1.  Await pathology results 2.  High fiber diet with liberal fluid intake. 3.  Repeat Colonoscopy in 5 years.  eSigned:  Ladene Artist, MD, [facility] 02/17/2014 8:28 AM

## 2014-02-18 ENCOUNTER — Telehealth: Payer: Self-pay

## 2014-02-18 NOTE — Telephone Encounter (Signed)
Left a message at [phone] for the pt to call us back if any questions or concerns. maw

## 2014-02-25 ENCOUNTER — Encounter: Payer: Self-pay | Admitting: Gastroenterology

## 2014-06-22 NOTE — Telephone Encounter (Signed)
error:[id number] ° °

## 2015-07-26 ENCOUNTER — Encounter: Payer: Federal, State, Local not specified - PPO | Admitting: Internal Medicine

## 2015-08-02 ENCOUNTER — Encounter: Payer: Federal, State, Local not specified - PPO | Admitting: Internal Medicine

## 2015-08-10 ENCOUNTER — Ambulatory Visit (INDEPENDENT_AMBULATORY_CARE_PROVIDER_SITE_OTHER): Payer: Federal, State, Local not specified - PPO | Admitting: Internal Medicine

## 2015-08-10 ENCOUNTER — Other Ambulatory Visit (INDEPENDENT_AMBULATORY_CARE_PROVIDER_SITE_OTHER): Payer: Federal, State, Local not specified - PPO

## 2015-08-10 ENCOUNTER — Encounter: Payer: Self-pay | Admitting: Internal Medicine

## 2015-08-10 VITALS — BP 128/86 | HR 88 | Ht 72.0 in | Wt 165.0 lb

## 2015-08-10 DIAGNOSIS — N529 Male erectile dysfunction, unspecified: Secondary | ICD-10-CM | POA: Insufficient documentation

## 2015-08-10 DIAGNOSIS — N528 Other male erectile dysfunction: Secondary | ICD-10-CM

## 2015-08-10 DIAGNOSIS — N3941 Urge incontinence: Secondary | ICD-10-CM | POA: Diagnosis not present

## 2015-08-10 DIAGNOSIS — Z0000 Encounter for general adult medical examination without abnormal findings: Secondary | ICD-10-CM | POA: Diagnosis not present

## 2015-08-10 DIAGNOSIS — F1020 Alcohol dependence, uncomplicated: Secondary | ICD-10-CM

## 2015-08-10 DIAGNOSIS — E559 Vitamin D deficiency, unspecified: Secondary | ICD-10-CM

## 2015-08-10 LAB — HEPATIC FUNCTION PANEL
ALBUMIN: 4.3 g/dL (ref 3.5–5.2)
ALK PHOS: 54 U/L (ref 39–117)
ALT: 14 U/L (ref 0–53)
AST: 15 U/L (ref 0–37)
Bilirubin, Direct: 0.2 mg/dL (ref 0.0–0.3)
TOTAL PROTEIN: 6.7 g/dL (ref 6.0–8.3)
Total Bilirubin: 0.9 mg/dL (ref 0.2–1.2)

## 2015-08-10 LAB — PSA: PSA: 0.93 ng/mL (ref 0.10–4.00)

## 2015-08-10 LAB — CBC WITH DIFFERENTIAL/PLATELET
BASOS PCT: 0.3 % (ref 0.0–3.0)
Basophils Absolute: 0 10*3/uL (ref 0.0–0.1)
EOS ABS: 0.1 10*3/uL (ref 0.0–0.7)
EOS PCT: 2 % (ref 0.0–5.0)
HEMATOCRIT: 42.3 % (ref 39.0–52.0)
HEMOGLOBIN: 14.2 g/dL (ref 13.0–17.0)
LYMPHS PCT: 22.2 % (ref 12.0–46.0)
Lymphs Abs: 1.5 10*3/uL (ref 0.7–4.0)
MCHC: 33.5 g/dL (ref 30.0–36.0)
MCV: 87.9 fl (ref 78.0–100.0)
MONO ABS: 0.6 10*3/uL (ref 0.1–1.0)
Monocytes Relative: 9.4 % (ref 3.0–12.0)
Neutro Abs: 4.3 10*3/uL (ref 1.4–7.7)
Neutrophils Relative %: 66.1 % (ref 43.0–77.0)
Platelets: 324 10*3/uL (ref 150.0–400.0)
RBC: 4.81 Mil/uL (ref 4.22–5.81)
RDW: 14.6 % (ref 11.5–15.5)
WBC: 6.5 10*3/uL (ref 4.0–10.5)

## 2015-08-10 LAB — LIPID PANEL
CHOLESTEROL: 166 mg/dL (ref 0–200)
HDL: 62.9 mg/dL (ref >39.00)
LDL Cholesterol: 88 mg/dL (ref 0–99)
NONHDL: 103.01
Total CHOL/HDL Ratio: 3
Triglycerides: 74 mg/dL (ref 0.0–149.0)
VLDL: 14.8 mg/dL (ref 0.0–40.0)

## 2015-08-10 LAB — URINALYSIS, ROUTINE W REFLEX MICROSCOPIC
Bilirubin Urine: NEGATIVE
KETONES UR: NEGATIVE
LEUKOCYTES UA: NEGATIVE
Nitrite: NEGATIVE
PH: 5.5 (ref 5.0–8.0)
Total Protein, Urine: NEGATIVE
URINE GLUCOSE: NEGATIVE
UROBILINOGEN UA: 0.2 (ref 0.0–1.0)

## 2015-08-10 LAB — BASIC METABOLIC PANEL
BUN: 15 mg/dL (ref 6–23)
CO2: 28 meq/L (ref 19–32)
Calcium: 9.7 mg/dL (ref 8.4–10.5)
Chloride: 105 mEq/L (ref 96–112)
Creatinine, Ser: 0.83 mg/dL (ref 0.40–1.50)
GFR: 119.56 mL/min (ref >60.00)
GLUCOSE: 100 mg/dL — AB (ref 70–99)
POTASSIUM: 4.1 meq/L (ref 3.5–5.1)
SODIUM: 138 meq/L (ref 135–145)

## 2015-08-10 LAB — TSH: TSH: 1.22 u[IU]/mL (ref 0.35–4.50)

## 2015-08-10 MED ORDER — TADALAFIL 20 MG PO TABS: 20.0000 mg | ORAL_TABLET | Freq: Every day | ORAL | Status: DC | PRN

## 2015-08-10 NOTE — Assessment & Plan Note (Signed)
On Vit D 

## 2015-08-10 NOTE — Assessment & Plan Note (Signed)
Drinking very little ?

## 2015-08-10 NOTE — Progress Notes (Signed)
Subjective:  Patient ID: Travis Hicks, male    DOB: [birth year]  Age: 65 y.o. MRN: [id number]  CC: Annual Exam   HPI Travis Hicks presents for a well exam C/o ED  Outpatient Prescriptions Prior to Visit  Medication Sig Dispense Refill  . aspirin 81 MG tablet Take 81 mg by mouth daily.    Marland Kitchen b complex vitamins tablet Take 1 tablet by mouth daily. 100 tablet 3  . cholecalciferol (VITAMIN D) 1000 UNITS tablet Take 1 tablet (1,000 Units total) by mouth daily. 100 tablet 3  . ibuprofen (ADVIL,MOTRIN) 800 MG tablet Take 1 tablet (800 mg total) by mouth 3 (three) times daily. (Patient not taking: Reported on 08/10/2015) 21 tablet 0  . mirabegron ER (MYRBETRIQ) 50 MG TB24 tablet Take 1 tablet (50 mg total) by mouth daily. (Patient not taking: Reported on 08/10/2015) 90 tablet 3   No facility-administered medications prior to visit.    ROS Review of Systems  Constitutional: Negative for appetite change, fatigue and unexpected weight change.  HENT: Negative for congestion, nosebleeds, sneezing, sore throat and trouble swallowing.   Eyes: Negative for itching and visual disturbance.  Respiratory: Negative for cough.   Cardiovascular: Negative for chest pain, palpitations and leg swelling.  Gastrointestinal: Negative for nausea, diarrhea, blood in stool and abdominal distention.  Genitourinary: Negative for frequency and hematuria.  Musculoskeletal: Positive for back pain. Negative for joint swelling, gait problem and neck pain.  Skin: Negative for rash.  Neurological: Negative for dizziness, tremors, speech difficulty and weakness.  Psychiatric/Behavioral: Negative for suicidal ideas, sleep disturbance, dysphoric mood and agitation. The patient is not nervous/anxious.     Objective:  BP 128/86 mmHg  Pulse 88  Ht 6' (1.829 m)  Wt 165 lb (74.844 kg)  BMI 22.37 kg/m2  SpO2 93%  BP Readings from Last 3 Encounters:  08/10/15 128/86  02/17/14 107/75  12/19/13 132/90    Wt Readings  from Last 3 Encounters:  08/10/15 165 lb (74.844 kg)  02/17/14 168 lb (76.204 kg)  02/03/14 168 lb 12.8 oz (76.567 kg)    Physical Exam  Constitutional: He is oriented to person, place, and time. He appears well-developed. No distress.  NAD  HENT:  Mouth/Throat: Oropharynx is clear and moist.  Eyes: Conjunctivae are normal. Pupils are equal, round, and reactive to light.  Neck: Normal range of motion. No JVD present. No thyromegaly present.  Cardiovascular: Normal rate, regular rhythm, normal heart sounds and intact distal pulses.  Exam reveals no gallop and no friction rub.   No murmur heard. Pulmonary/Chest: Effort normal and breath sounds normal. No respiratory distress. He has no wheezes. He has no rales. He exhibits no tenderness.  Abdominal: Soft. Bowel sounds are normal. He exhibits no distension and no mass. There is no tenderness. There is no rebound and no guarding.  Musculoskeletal: Normal range of motion. He exhibits no edema or tenderness.  Lymphadenopathy:    He has no cervical adenopathy.  Neurological: He is alert and oriented to person, place, and time. He has normal reflexes. No cranial nerve deficit. He exhibits normal muscle tone. He displays a negative Romberg sign. Coordination and gait normal.  Skin: Skin is warm and dry. No rash noted.  Psychiatric: He has a normal mood and affect. His behavior is normal. Judgment and thought content normal.  Prostate - refused exam B wax    Procedure Note :     Procedure :  Ear irrigation   Indication:  Cerumen impaction  Risks, including pain, dizziness, eardrum perforation, bleeding, infection and others as well as benefits were explained to the patient in detail. Verbal consent was obtained and the patient agreed to proceed.    We used "The Elephant Ear Irrigation Device" filled with lukewarm water for irrigation. A large amount wax was recovered. Procedure has also required manual wax removal with an ear loop.    Tolerated well. Complications: None.   Postprocedure instructions :  Call if problems.    Lab Results  Component Value Date   WBC 8.4 10/22/2013   HGB 15.4 10/22/2013   HCT 46.1 10/22/2013   PLT 317.0 10/22/2013   GLUCOSE 71 10/22/2013   CHOL 200 01/14/2013   TRIG 122.0 01/14/2013   HDL 69.50 01/14/2013   LDLCALC 106* 01/14/2013   ALT 17 01/14/2013   AST 18 01/14/2013   NA 138 10/22/2013   K 5.0 10/22/2013   CL 101 10/22/2013   CREATININE 1.0 10/22/2013   BUN 17 10/22/2013   CO2 24 10/22/2013   TSH 0.97 01/14/2013   PSA 0.99 10/22/2013    US Scrotum  07/15/2013  CLINICAL DATA:  Right testicular pain and swelling. Known epididymitis, on antibiotics for 5 days. EXAM: SCROTAL ULTRASOUND DOPPLER ULTRASOUND OF THE TESTICLES TECHNIQUE: Complete ultrasound examination of the testicles, epididymis, and other scrotal structures was performed. Color and spectral Doppler ultrasound were also utilized to evaluate blood flow to the testicles. COMPARISON:  None. FINDINGS: Right testicle Measurements: 3.2 x 2.1 x 2.5 cm. No mass or microlithiasis visualized. Left testicle Measurements: 3.5 x 1.6 x 2.4 cm. No mass or microlithiasis visualized. Right epididymis: Diffusely enlarged and heterogeneous in appearance, measuring 6.9 x 3.6 x 3.9 cm, with diffusely increased blood flow on limited Doppler evaluation. Partially cystic areas are noted within the epididymis. Left epididymis:  Normal in size and appearance. Hydrocele: A moderate to large right-sided hydrocele is noted, with several septations seen. Varicocele: A left-sided varicocele is noted; this may be partially thrombosed, though some degree of augmentation is noted on Valsalva maneuver. Pulsed Doppler interrogation of both testes demonstrates low resistance arterial and venous waveforms bilaterally. Note is made of asymmetric right-sided scrotal skin thickening. IMPRESSION: 1. Relatively severe right-sided epididymitis, with diffuse enlargement  and heterogeneity of the right epididymis, significant hyperemia and partially cystic areas within the epididymis. 2. Associated moderate to large complex right-sided hydrocele, with several septations. 3. No evidence of orchitis or testicular torsion. The testes are unremarkable in appearance. 4. Left-sided varicocele noted; this may be partially thrombosed, given its appearance. Electronically Signed   By: Garald Balding M.D.   On: 07/15/2013 00:13   [place] Art/ven Flow Abd Pelv Doppler  07/15/2013  CLINICAL DATA:  Right testicular pain and swelling. Known epididymitis, on antibiotics for 5 days. EXAM: SCROTAL ULTRASOUND DOPPLER ULTRASOUND OF THE TESTICLES TECHNIQUE: Complete ultrasound examination of the testicles, epididymis, and other scrotal structures was performed. Color and spectral Doppler ultrasound were also utilized to evaluate blood flow to the testicles. COMPARISON:  None. FINDINGS: Right testicle Measurements: 3.2 x 2.1 x 2.5 cm. No mass or microlithiasis visualized. Left testicle Measurements: 3.5 x 1.6 x 2.4 cm. No mass or microlithiasis visualized. Right epididymis: Diffusely enlarged and heterogeneous in appearance, measuring 6.9 x 3.6 x 3.9 cm, with diffusely increased blood flow on limited Doppler evaluation. Partially cystic areas are noted within the epididymis. Left epididymis:  Normal in size and appearance. Hydrocele: A moderate to large right-sided hydrocele is noted, with several septations seen. Varicocele: A left-sided  varicocele is noted; this may be partially thrombosed, though some degree of augmentation is noted on Valsalva maneuver. Pulsed Doppler interrogation of both testes demonstrates low resistance arterial and venous waveforms bilaterally. Note is made of asymmetric right-sided scrotal skin thickening. IMPRESSION: 1. Relatively severe right-sided epididymitis, with diffuse enlargement and heterogeneity of the right epididymis, significant hyperemia and partially cystic areas  within the epididymis. 2. Associated moderate to large complex right-sided hydrocele, with several septations. 3. No evidence of orchitis or testicular torsion. The testes are unremarkable in appearance. 4. Left-sided varicocele noted; this may be partially thrombosed, given its appearance. Electronically Signed   By: Garald Balding M.D.   On: 07/15/2013 00:13    Assessment & Plan:   There are no diagnoses linked to this encounter. I am having Mr. Stonehocker maintain his aspirin, cholecalciferol, b complex vitamins, ibuprofen, and mirabegron ER.  No orders of the defined types were placed in this encounter.     Follow-up: No Follow-up on file.  Walker Kehr, MD

## 2015-08-10 NOTE — Assessment & Plan Note (Signed)
Resolved Off Myrbetriq

## 2015-08-10 NOTE — Progress Notes (Signed)
Pre visit review using our clinic review tool, if applicable. No additional management support is needed unless otherwise documented below in the visit note. 

## 2015-08-10 NOTE — Assessment & Plan Note (Signed)
We discussed age appropriate health related issues, including available/recomended screening tests and vaccinations. We discussed a need for adhering to healthy diet and exercise. Labs/EKG were reviewed/ordered. All questions were answered.  Colon 2015

## 2015-08-10 NOTE — Assessment & Plan Note (Signed)
Chronic Cialis prn

## 2015-08-11 LAB — HEPATITIS C ANTIBODY: HCV Ab: NEGATIVE

## 2016-02-21 DIAGNOSIS — H409 Unspecified glaucoma: Secondary | ICD-10-CM

## 2016-02-21 HISTORY — DX: Unspecified glaucoma: H40.9

## 2016-08-14 ENCOUNTER — Encounter: Payer: Federal, State, Local not specified - PPO | Admitting: Internal Medicine

## 2016-08-21 ENCOUNTER — Encounter: Payer: Self-pay | Admitting: Internal Medicine

## 2016-08-21 ENCOUNTER — Ambulatory Visit (INDEPENDENT_AMBULATORY_CARE_PROVIDER_SITE_OTHER): Payer: Federal, State, Local not specified - PPO | Admitting: Internal Medicine

## 2016-08-21 ENCOUNTER — Other Ambulatory Visit (INDEPENDENT_AMBULATORY_CARE_PROVIDER_SITE_OTHER): Payer: Federal, State, Local not specified - PPO

## 2016-08-21 VITALS — BP 110/74 | HR 88 | Temp 97.8°F | Ht 72.0 in | Wt 162.0 lb

## 2016-08-21 DIAGNOSIS — Z0000 Encounter for general adult medical examination without abnormal findings: Secondary | ICD-10-CM

## 2016-08-21 DIAGNOSIS — H401130 Primary open-angle glaucoma, bilateral, stage unspecified: Secondary | ICD-10-CM

## 2016-08-21 DIAGNOSIS — H6123 Impacted cerumen, bilateral: Secondary | ICD-10-CM | POA: Diagnosis not present

## 2016-08-21 DIAGNOSIS — F1020 Alcohol dependence, uncomplicated: Secondary | ICD-10-CM

## 2016-08-21 DIAGNOSIS — F17200 Nicotine dependence, unspecified, uncomplicated: Secondary | ICD-10-CM

## 2016-08-21 DIAGNOSIS — R1030 Lower abdominal pain, unspecified: Secondary | ICD-10-CM | POA: Diagnosis not present

## 2016-08-21 DIAGNOSIS — N529 Male erectile dysfunction, unspecified: Secondary | ICD-10-CM

## 2016-08-21 DIAGNOSIS — H409 Unspecified glaucoma: Secondary | ICD-10-CM | POA: Insufficient documentation

## 2016-08-21 LAB — CBC WITH DIFFERENTIAL/PLATELET
BASOS ABS: 0 10*3/uL (ref 0.0–0.1)
Basophils Relative: 0.5 % (ref 0.0–3.0)
EOS ABS: 0.1 10*3/uL (ref 0.0–0.7)
Eosinophils Relative: 1.7 % (ref 0.0–5.0)
HCT: 45.5 % (ref 39.0–52.0)
HEMOGLOBIN: 15.4 g/dL (ref 13.0–17.0)
LYMPHS ABS: 2.1 10*3/uL (ref 0.7–4.0)
Lymphocytes Relative: 31.8 % (ref 12.0–46.0)
MCHC: 33.9 g/dL (ref 30.0–36.0)
MCV: 89.6 fl (ref 78.0–100.0)
MONO ABS: 0.6 10*3/uL (ref 0.1–1.0)
Monocytes Relative: 10 % (ref 3.0–12.0)
NEUTROS PCT: 56 % (ref 43.0–77.0)
Neutro Abs: 3.6 10*3/uL (ref 1.4–7.7)
Platelets: 298 10*3/uL (ref 150.0–400.0)
RBC: 5.08 Mil/uL (ref 4.22–5.81)
RDW: 14.4 % (ref 11.5–15.5)
WBC: 6.5 10*3/uL (ref 4.0–10.5)

## 2016-08-21 LAB — URINALYSIS, ROUTINE W REFLEX MICROSCOPIC
Bilirubin Urine: NEGATIVE
KETONES UR: NEGATIVE
LEUKOCYTES UA: NEGATIVE
Nitrite: NEGATIVE
Specific Gravity, Urine: >=1.03 — AB (ref 1.000–1.030)
Total Protein, Urine: NEGATIVE
URINE GLUCOSE: NEGATIVE
Urobilinogen, UA: 0.2 (ref 0.0–1.0)
pH: 5.5 (ref 5.0–8.0)

## 2016-08-21 LAB — HEPATIC FUNCTION PANEL
ALK PHOS: 46 U/L (ref 39–117)
ALT: 11 U/L (ref 0–53)
AST: 15 U/L (ref 0–37)
Albumin: 4.5 g/dL (ref 3.5–5.2)
BILIRUBIN DIRECT: 0.2 mg/dL (ref 0.0–0.3)
BILIRUBIN TOTAL: 1.2 mg/dL (ref 0.2–1.2)
Total Protein: 7 g/dL (ref 6.0–8.3)

## 2016-08-21 LAB — BASIC METABOLIC PANEL
BUN: 16 mg/dL (ref 6–23)
CALCIUM: 10.1 mg/dL (ref 8.4–10.5)
CO2: 28 mEq/L (ref 19–32)
Chloride: 102 mEq/L (ref 96–112)
Creatinine, Ser: 0.91 mg/dL (ref 0.40–1.50)
GFR: 107.17 mL/min (ref >60.00)
Glucose, Bld: 96 mg/dL (ref 70–99)
Potassium: 4.5 mEq/L (ref 3.5–5.1)
SODIUM: 137 meq/L (ref 135–145)

## 2016-08-21 LAB — LIPID PANEL
CHOL/HDL RATIO: 3
CHOLESTEROL: 187 mg/dL (ref 0–200)
HDL: 65.5 mg/dL (ref >39.00)
LDL CALC: 107 mg/dL — AB (ref 0–99)
NonHDL: 121.51
Triglycerides: 73 mg/dL (ref 0.0–149.0)
VLDL: 14.6 mg/dL (ref 0.0–40.0)

## 2016-08-21 MED ORDER — TADALAFIL 20 MG PO TABS: 20.0000 mg | ORAL_TABLET | Freq: Every day | ORAL | 3 refills | Status: DC | PRN

## 2016-08-21 MED ORDER — SACCHAROMYCES BOULARDII 250 MG PO CAPS: 250.0000 mg | ORAL_CAPSULE | Freq: Two times a day (BID) | ORAL | 1 refills | Status: DC

## 2016-08-21 NOTE — Assessment & Plan Note (Signed)
New On gtt

## 2016-08-21 NOTE — Assessment & Plan Note (Signed)
Cialis prn 

## 2016-08-21 NOTE — Assessment & Plan Note (Signed)
No ETOH

## 2016-08-21 NOTE — Progress Notes (Signed)
Subjective:  Patient ID: Travis Hicks, male    DOB: [birth year]  Age: 66 y.o. MRN: [id number]  CC: No chief complaint on file.   HPI Travis Hicks presents for a well exam. C/o loose stools, soreness. Pt drinks none lately. C/o lower abd pain x 2 years. Smoker   Outpatient Medications Prior to Visit  Medication Sig Dispense Refill  . aspirin 81 MG tablet Take 81 mg by mouth daily.    Marland Kitchen b complex vitamins tablet Take 1 tablet by mouth daily. 100 tablet 3  . tadalafil (CIALIS) 20 MG tablet Take 1 tablet (20 mg total) by mouth daily as needed for erectile dysfunction. 30 tablet 3  . cholecalciferol (VITAMIN D) 1000 UNITS tablet Take 1 tablet (1,000 Units total) by mouth daily. 100 tablet 3   No facility-administered medications prior to visit.     ROS Review of Systems  Constitutional: Negative for appetite change, fatigue and unexpected weight change.  HENT: Negative for congestion, nosebleeds, sneezing, sore throat and trouble swallowing.   Eyes: Negative for itching and visual disturbance.  Respiratory: Negative for cough.   Cardiovascular: Negative for chest pain, palpitations and leg swelling.  Gastrointestinal: Positive for abdominal pain and diarrhea. Negative for abdominal distention, blood in stool and nausea.  Genitourinary: Negative for frequency and hematuria.  Musculoskeletal: Positive for arthralgias. Negative for back pain, gait problem, joint swelling and neck pain.  Skin: Negative for rash.  Neurological: Negative for dizziness, tremors, speech difficulty and weakness.  Psychiatric/Behavioral: Negative for agitation, dysphoric mood and sleep disturbance. The patient is not nervous/anxious.   Declined rectal   Objective:  BP 110/74 (BP Location: Left Arm, Patient Position: Sitting, Cuff Size: Normal)   Pulse 88   Temp 97.8 F (36.6 C) (Oral)   Ht 6' (1.829 m)   Wt 162 lb (73.5 kg)   SpO2 98%   BMI 21.97 kg/m   BP Readings from Last 3 Encounters:    08/21/16 110/74  08/10/15 128/86  02/17/14 107/75    Wt Readings from Last 3 Encounters:  08/21/16 162 lb (73.5 kg)  08/10/15 165 lb (74.8 kg)  02/17/14 168 lb (76.2 kg)    Physical Exam  Constitutional: He is oriented to person, place, and time. He appears well-developed and well-nourished. No distress.  HENT:  Head: Normocephalic and atraumatic.  Right Ear: External ear normal.  Left Ear: External ear normal.  Nose: Nose normal.  Mouth/Throat: Oropharynx is clear and moist. No oropharyngeal exudate.  Eyes: Conjunctivae and EOM are normal. Pupils are equal, round, and reactive to light. Right eye exhibits no discharge. Left eye exhibits no discharge. No scleral icterus.  Neck: Normal range of motion. Neck supple. No JVD present. No tracheal deviation present. No thyromegaly present.  Cardiovascular: Normal rate, regular rhythm, normal heart sounds and intact distal pulses.  Exam reveals no gallop and no friction rub.   No murmur heard. Pulmonary/Chest: Effort normal and breath sounds normal. No stridor. No respiratory distress. He has no wheezes. He has no rales. He exhibits no tenderness.  Abdominal: Soft. Bowel sounds are normal. He exhibits no distension and no mass. There is no tenderness. There is no rebound and no guarding.  Genitourinary: Rectum normal, prostate normal and penis normal. Rectal exam shows guaiac negative stool. No penile tenderness.  Musculoskeletal: Normal range of motion. He exhibits no edema or tenderness.  Lymphadenopathy:    He has no cervical adenopathy.  Neurological: He is alert and oriented to person, place, and time.  He has normal reflexes. No cranial nerve deficit. He exhibits normal muscle tone. Coordination normal.  Skin: Skin is warm and dry. No rash noted. He is not diaphoretic. No erythema. No pallor.  Psychiatric: He has a normal mood and affect. His behavior is normal. Judgment and thought content normal.  Declined rectal B wax    Procedure Note :     Procedure :  Ear irrigation   Indication:  Cerumen impaction B   Risks, including pain, dizziness, eardrum perforation, bleeding, infection and others as well as benefits were explained to the patient in detail. Verbal consent was obtained and the patient agreed to proceed.    We used "The Elephant Ear Irrigation Device" filled with lukewarm water for irrigation. A large amount wax was recovered. Procedure has also required manual wax removal with an ear loop.   Tolerated well. Complications: None.   Postprocedure instructions :  Call if problems.    Lab Results  Component Value Date   WBC 6.5 08/10/2015   HGB 14.2 08/10/2015   HCT 42.3 08/10/2015   PLT 324.0 08/10/2015   GLUCOSE 100 (H) 08/10/2015   CHOL 166 08/10/2015   TRIG 74.0 08/10/2015   HDL 62.90 08/10/2015   LDLCALC 88 08/10/2015   ALT 14 08/10/2015   AST 15 08/10/2015   NA 138 08/10/2015   K 4.1 08/10/2015   CL 105 08/10/2015   CREATININE 0.83 08/10/2015   BUN 15 08/10/2015   CO2 28 08/10/2015   TSH 1.22 08/10/2015   PSA 0.93 08/10/2015    US Scrotum  Result Date: 07/15/2013 CLINICAL DATA:  Right testicular pain and swelling. Known epididymitis, on antibiotics for 5 days. EXAM: SCROTAL ULTRASOUND DOPPLER ULTRASOUND OF THE TESTICLES TECHNIQUE: Complete ultrasound examination of the testicles, epididymis, and other scrotal structures was performed. Color and spectral Doppler ultrasound were also utilized to evaluate blood flow to the testicles. COMPARISON:  None. FINDINGS: Right testicle Measurements: 3.2 x 2.1 x 2.5 cm. No mass or microlithiasis visualized. Left testicle Measurements: 3.5 x 1.6 x 2.4 cm. No mass or microlithiasis visualized. Right epididymis: Diffusely enlarged and heterogeneous in appearance, measuring 6.9 x 3.6 x 3.9 cm, with diffusely increased blood flow on limited Doppler evaluation. Partially cystic areas are noted within the epididymis. Left epididymis:  Normal in size and  appearance. Hydrocele: A moderate to large right-sided hydrocele is noted, with several septations seen. Varicocele: A left-sided varicocele is noted; this may be partially thrombosed, though some degree of augmentation is noted on Valsalva maneuver. Pulsed Doppler interrogation of both testes demonstrates low resistance arterial and venous waveforms bilaterally. Note is made of asymmetric right-sided scrotal skin thickening. IMPRESSION: 1. Relatively severe right-sided epididymitis, with diffuse enlargement and heterogeneity of the right epididymis, significant hyperemia and partially cystic areas within the epididymis. 2. Associated moderate to large complex right-sided hydrocele, with several septations. 3. No evidence of orchitis or testicular torsion. The testes are unremarkable in appearance. 4. Left-sided varicocele noted; this may be partially thrombosed, given its appearance. Electronically Signed   By: Garald Balding M.D.   On: 07/15/2013 00:13   [place] Art/ven Flow Abd Pelv Doppler  Result Date: 07/15/2013 CLINICAL DATA:  Right testicular pain and swelling. Known epididymitis, on antibiotics for 5 days. EXAM: SCROTAL ULTRASOUND DOPPLER ULTRASOUND OF THE TESTICLES TECHNIQUE: Complete ultrasound examination of the testicles, epididymis, and other scrotal structures was performed. Color and spectral Doppler ultrasound were also utilized to evaluate blood flow to the testicles. COMPARISON:  None. FINDINGS: Right testicle  Measurements: 3.2 x 2.1 x 2.5 cm. No mass or microlithiasis visualized. Left testicle Measurements: 3.5 x 1.6 x 2.4 cm. No mass or microlithiasis visualized. Right epididymis: Diffusely enlarged and heterogeneous in appearance, measuring 6.9 x 3.6 x 3.9 cm, with diffusely increased blood flow on limited Doppler evaluation. Partially cystic areas are noted within the epididymis. Left epididymis:  Normal in size and appearance. Hydrocele: A moderate to large right-sided hydrocele is noted, with  several septations seen. Varicocele: A left-sided varicocele is noted; this may be partially thrombosed, though some degree of augmentation is noted on Valsalva maneuver. Pulsed Doppler interrogation of both testes demonstrates low resistance arterial and venous waveforms bilaterally. Note is made of asymmetric right-sided scrotal skin thickening. IMPRESSION: 1. Relatively severe right-sided epididymitis, with diffuse enlargement and heterogeneity of the right epididymis, significant hyperemia and partially cystic areas within the epididymis. 2. Associated moderate to large complex right-sided hydrocele, with several septations. 3. No evidence of orchitis or testicular torsion. The testes are unremarkable in appearance. 4. Left-sided varicocele noted; this may be partially thrombosed, given its appearance. Electronically Signed   By: Garald Balding M.D.   On: 07/15/2013 00:13    Assessment & Plan:   There are no diagnoses linked to this encounter. I am having Mr. Flakes maintain his aspirin, cholecalciferol, b complex vitamins, tadalafil, and TURMERIC PO.  Meds ordered this encounter  Medications  . TURMERIC PO    Sig: Take by mouth.     Follow-up: No Follow-up on file.  Walker Kehr, MD

## 2016-08-21 NOTE — Assessment & Plan Note (Signed)
Discussed.

## 2016-08-21 NOTE — Assessment & Plan Note (Addendum)
Options discussed: GI ref vs CT abd Declined rectal Florastor

## 2016-08-21 NOTE — Assessment & Plan Note (Addendum)
We discussed age appropriate health related issues, including available/recomended screening tests and vaccinations. We discussed a need for adhering to healthy diet and exercise. Labs were ordered to be later reviewed . All questions were answered.  Colon is due in 2020 Dr Fuller Plan Declined all shots

## 2016-08-21 NOTE — Assessment & Plan Note (Signed)
Will irrigate B

## 2016-08-22 LAB — TSH: TSH: 1.1 u[IU]/mL (ref 0.35–4.50)

## 2016-08-22 LAB — PSA: PSA: 1.16 ng/mL (ref 0.10–4.00)

## 2016-10-09 ENCOUNTER — Encounter: Payer: Self-pay | Admitting: Internal Medicine

## 2016-10-09 ENCOUNTER — Ambulatory Visit (INDEPENDENT_AMBULATORY_CARE_PROVIDER_SITE_OTHER): Payer: Federal, State, Local not specified - PPO | Admitting: Internal Medicine

## 2016-10-09 ENCOUNTER — Ambulatory Visit (INDEPENDENT_AMBULATORY_CARE_PROVIDER_SITE_OTHER)
Admission: RE | Admit: 2016-10-09 | Discharge: 2016-10-09 | Disposition: A | Discharge status: Home / Self Care | Payer: Federal, State, Local not specified - PPO | Source: Ambulatory Visit | Attending: Internal Medicine | Admitting: Internal Medicine

## 2016-10-09 VITALS — BP 120/84 | HR 97 | Temp 98.1°F | Ht 72.0 in | Wt 160.0 lb

## 2016-10-09 DIAGNOSIS — R935 Abnormal findings on diagnostic imaging of other abdominal regions, including retroperitoneum: Secondary | ICD-10-CM | POA: Diagnosis not present

## 2016-10-09 DIAGNOSIS — G8929 Other chronic pain: Secondary | ICD-10-CM

## 2016-10-09 DIAGNOSIS — M545 Low back pain, unspecified: Secondary | ICD-10-CM | POA: Insufficient documentation

## 2016-10-09 DIAGNOSIS — R1030 Lower abdominal pain, unspecified: Secondary | ICD-10-CM

## 2016-10-09 DIAGNOSIS — E559 Vitamin D deficiency, unspecified: Secondary | ICD-10-CM | POA: Diagnosis not present

## 2016-10-09 DIAGNOSIS — R311 Benign essential microscopic hematuria: Secondary | ICD-10-CM | POA: Diagnosis not present

## 2016-10-09 NOTE — Assessment & Plan Note (Addendum)
X ray LS - ?spinal stenosis Repeat abd CT's x2 were (-) Declined PT

## 2016-10-09 NOTE — Assessment & Plan Note (Signed)
On Vit D 

## 2016-10-09 NOTE — Assessment & Plan Note (Addendum)
Repeat abd CT's x2 were (-) Treat LBP

## 2016-10-09 NOTE — Assessment & Plan Note (Addendum)
Remote - repeat CTs were nl  F/u w/Dr Fuller Plan if pain is not better after retirement Colon due 2020

## 2016-10-09 NOTE — Progress Notes (Signed)
Subjective:  Patient ID: Travis Hicks, male    DOB: Jun 21, 1950  Age: 66 y.o. MRN: [id number]  CC: No chief complaint on file.   HPI Travis Hicks presents for chronic mild abd pain 2/10, LBP 2/10 - mild. LBP is better w/leaning forward. Seating at work aggravates pain... Planning to retire in 9/18 He saw a urologist a couple years ago  Outpatient Medications Prior to Visit  Medication Sig Dispense Refill  . aspirin 81 MG tablet Take 81 mg by mouth daily.    Marland Kitchen b complex vitamins tablet Take 1 tablet by mouth daily. 100 tablet 3  . tadalafil (CIALIS) 20 MG tablet Take 1 tablet (20 mg total) by mouth daily as needed for erectile dysfunction. 30 tablet 3  . TURMERIC PO Take by mouth.    . cholecalciferol (VITAMIN D) 1000 UNITS tablet Take 1 tablet (1,000 Units total) by mouth daily. 100 tablet 3  . saccharomyces boulardii (FLORASTOR) 250 MG capsule Take 1 capsule (250 mg total) by mouth 2 (two) times daily. 60 capsule 1   No facility-administered medications prior to visit.     ROS Review of Systems  Constitutional: Negative for appetite change, fatigue and unexpected weight change.  HENT: Negative for congestion, nosebleeds, sneezing, sore throat and trouble swallowing.   Eyes: Negative for itching and visual disturbance.  Respiratory: Negative for cough.   Cardiovascular: Negative for chest pain, palpitations and leg swelling.  Gastrointestinal: Positive for abdominal pain. Negative for abdominal distention, blood in stool, diarrhea and nausea.  Genitourinary: Negative for frequency and hematuria.  Musculoskeletal: Positive for back pain. Negative for gait problem, joint swelling and neck pain.  Skin: Negative for rash.  Neurological: Negative for dizziness, tremors, speech difficulty and weakness.  Psychiatric/Behavioral: Negative for agitation, dysphoric mood and sleep disturbance. The patient is not nervous/anxious.     Objective:  BP 120/84 (BP Location: Left Arm, Patient  Position: Sitting, Cuff Size: Normal)   Pulse 97   Temp 98.1 F (36.7 C) (Oral)   Ht 6' (1.829 m)   Wt 160 lb (72.6 kg)   SpO2 97%   BMI 21.70 kg/m   BP Readings from Last 3 Encounters:  10/09/16 120/84  08/21/16 110/74  08/10/15 128/86    Wt Readings from Last 3 Encounters:  10/09/16 160 lb (72.6 kg)  08/21/16 162 lb (73.5 kg)  08/10/15 165 lb (74.8 kg)    Physical Exam  Constitutional: He is oriented to person, place, and time. He appears well-developed. No distress.  NAD  HENT:  Mouth/Throat: Oropharynx is clear and moist.  Eyes: Pupils are equal, round, and reactive to light. Conjunctivae are normal.  Neck: Normal range of motion. No JVD present. No thyromegaly present.  Cardiovascular: Normal rate, regular rhythm, normal heart sounds and intact distal pulses.  Exam reveals no gallop and no friction rub.   No murmur heard. Pulmonary/Chest: Effort normal and breath sounds normal. No respiratory distress. He has no wheezes. He has no rales. He exhibits no tenderness.  Abdominal: Soft. Bowel sounds are normal. He exhibits no distension and no mass. There is no tenderness. There is no rebound and no guarding.  Musculoskeletal: Normal range of motion. He exhibits tenderness. He exhibits no edema.  Lymphadenopathy:    He has no cervical adenopathy.  Neurological: He is alert and oriented to person, place, and time. He has normal reflexes. No cranial nerve deficit. He exhibits normal muscle tone. He displays a negative Romberg sign. Coordination abnormal. Gait normal.  Skin:  Skin is warm and dry. No rash noted.  Psychiatric: He has a normal mood and affect. His behavior is normal. Judgment and thought content normal.  decreased ROM in LS spine  Lab Results  Component Value Date   WBC 6.5 08/21/2016   HGB 15.4 08/21/2016   HCT 45.5 08/21/2016   PLT 298.0 08/21/2016   GLUCOSE 96 08/21/2016   CHOL 187 08/21/2016   TRIG 73.0 08/21/2016   HDL 65.50 08/21/2016   LDLCALC 107  (H) 08/21/2016   ALT 11 08/21/2016   AST 15 08/21/2016   NA 137 08/21/2016   K 4.5 08/21/2016   CL 102 08/21/2016   CREATININE 0.91 08/21/2016   BUN 16 08/21/2016   CO2 28 08/21/2016   TSH 1.10 08/21/2016   PSA 1.16 08/21/2016    US Scrotum  Result Date: 07/15/2013 CLINICAL DATA:  Right testicular pain and swelling. Known epididymitis, on antibiotics for 5 days. EXAM: SCROTAL ULTRASOUND DOPPLER ULTRASOUND OF THE TESTICLES TECHNIQUE: Complete ultrasound examination of the testicles, epididymis, and other scrotal structures was performed. Color and spectral Doppler ultrasound were also utilized to evaluate blood flow to the testicles. COMPARISON:  None. FINDINGS: Right testicle Measurements: 3.2 x 2.1 x 2.5 cm. No mass or microlithiasis visualized. Left testicle Measurements: 3.5 x 1.6 x 2.4 cm. No mass or microlithiasis visualized. Right epididymis: Diffusely enlarged and heterogeneous in appearance, measuring 6.9 x 3.6 x 3.9 cm, with diffusely increased blood flow on limited Doppler evaluation. Partially cystic areas are noted within the epididymis. Left epididymis:  Normal in size and appearance. Hydrocele: A moderate to large right-sided hydrocele is noted, with several septations seen. Varicocele: A left-sided varicocele is noted; this may be partially thrombosed, though some degree of augmentation is noted on Valsalva maneuver. Pulsed Doppler interrogation of both testes demonstrates low resistance arterial and venous waveforms bilaterally. Note is made of asymmetric right-sided scrotal skin thickening. IMPRESSION: 1. Relatively severe right-sided epididymitis, with diffuse enlargement and heterogeneity of the right epididymis, significant hyperemia and partially cystic areas within the epididymis. 2. Associated moderate to large complex right-sided hydrocele, with several septations. 3. No evidence of orchitis or testicular torsion. The testes are unremarkable in appearance. 4. Left-sided  varicocele noted; this may be partially thrombosed, given its appearance. Electronically Signed   By: Garald Balding M.D.   On: 07/15/2013 00:13   [place] Art/ven Flow Abd Pelv Doppler  Result Date: 07/15/2013 CLINICAL DATA:  Right testicular pain and swelling. Known epididymitis, on antibiotics for 5 days. EXAM: SCROTAL ULTRASOUND DOPPLER ULTRASOUND OF THE TESTICLES TECHNIQUE: Complete ultrasound examination of the testicles, epididymis, and other scrotal structures was performed. Color and spectral Doppler ultrasound were also utilized to evaluate blood flow to the testicles. COMPARISON:  None. FINDINGS: Right testicle Measurements: 3.2 x 2.1 x 2.5 cm. No mass or microlithiasis visualized. Left testicle Measurements: 3.5 x 1.6 x 2.4 cm. No mass or microlithiasis visualized. Right epididymis: Diffusely enlarged and heterogeneous in appearance, measuring 6.9 x 3.6 x 3.9 cm, with diffusely increased blood flow on limited Doppler evaluation. Partially cystic areas are noted within the epididymis. Left epididymis:  Normal in size and appearance. Hydrocele: A moderate to large right-sided hydrocele is noted, with several septations seen. Varicocele: A left-sided varicocele is noted; this may be partially thrombosed, though some degree of augmentation is noted on Valsalva maneuver. Pulsed Doppler interrogation of both testes demonstrates low resistance arterial and venous waveforms bilaterally. Note is made of asymmetric right-sided scrotal skin thickening. IMPRESSION: 1. Relatively severe right-sided  epididymitis, with diffuse enlargement and heterogeneity of the right epididymis, significant hyperemia and partially cystic areas within the epididymis. 2. Associated moderate to large complex right-sided hydrocele, with several septations. 3. No evidence of orchitis or testicular torsion. The testes are unremarkable in appearance. 4. Left-sided varicocele noted; this may be partially thrombosed, given its appearance.  Electronically Signed   By: Garald Balding M.D.   On: 07/15/2013 00:13    Assessment & Plan:   There are no diagnoses linked to this encounter. I have discontinued Mr. Allcorn's saccharomyces boulardii. I am also having him maintain his aspirin, cholecalciferol, b complex vitamins, TURMERIC PO, and tadalafil.  No orders of the defined types were placed in this encounter.    Follow-up: No Follow-up on file.  Walker Kehr, MD

## 2016-10-09 NOTE — Assessment & Plan Note (Signed)
Life-long S/p Urol eval x 2

## 2017-01-08 ENCOUNTER — Ambulatory Visit: Payer: Federal, State, Local not specified - PPO | Admitting: Internal Medicine

## 2017-01-30 ENCOUNTER — Ambulatory Visit: Payer: Self-pay

## 2017-01-30 NOTE — Telephone Encounter (Signed)
Pt. Refused appointment for today.

## 2017-01-30 NOTE — Telephone Encounter (Signed)
Pt. Scheduled an appointment .

## 2017-01-30 NOTE — Telephone Encounter (Signed)
  Reason for Disposition . [1] MODERATE diarrhea (e.g., 4-6 times / day more than normal) AND [2] present > 48 hours (2 days)  Answer Assessment - Initial Assessment Questions 1. DIARRHEA SEVERITY: "How bad is the diarrhea?" "How many extra stools have you had in the past 24 hours than normal?"    - MILD: Few loose or mushy BMs; increase of 1-3 stools over normal daily number of stools; mild increase in ostomy output.   - MODERATE: Increase of 4-6 stools daily over normal; moderate increase in ostomy output.   - SEVERE (or Worst Possible): Increase of 7 or more stools daily over normal; moderate increase in ostomy output; incontinence.     4 2. ONSET: "When did the diarrhea begin?"      Started 4 days ago 3. BM CONSISTENCY: "How loose or watery is the diarrhea?"      Watery 4. VOMITING: "Are you also vomiting?" If so, ask: "How many times in the past 24 hours?"      Only when he coughs 5. ABDOMINAL PAIN: "Are you having any abdominal pain?" If yes: "What does it feel like?" (e.g., crampy, dull, intermittent, constant)      No 6. ABDOMINAL PAIN SEVERITY: If present, ask: "How bad is the pain?"  (e.g., Scale 1-10; mild, moderate, or severe)    - MILD (1-3): doesn't interfere with normal activities, abdomen soft and not tender to touch     - MODERATE (4-7): interferes with normal activities or awakens from sleep, tender to touch     - SEVERE (8-10): excruciating pain, doubled over, unable to do any normal activities       No 7. ORAL INTAKE: If vomiting, "Have you been able to drink liquids?" "How much fluids have you had in the past 24 hours?"     Small amounts of fluids and foods 8. HYDRATION: "Any signs of dehydration?" (e.g., dry mouth [not just dry lips], too weak to stand, dizziness, new weight loss) "When did you last urinate?"     No dizziness. 9. EXPOSURE: "Have you traveled to a foreign country recently?" "Have you been exposed to anyone with diarrhea?" "Could you have eaten any food  that was spoiled?"     No 10. OTHER SYMPTOMS: "Do you have any other symptoms?" (e.g., fever, blood in stool)       Low grade fever and headache and coughing 11. PREGNANCY: "Is there any chance you are pregnant?" "When was your last menstrual period?"       No  Protocols used: DIARRHEA-A-AH

## 2017-01-31 ENCOUNTER — Ambulatory Visit: Payer: Federal, State, Local not specified - PPO | Admitting: Internal Medicine

## 2017-01-31 ENCOUNTER — Encounter: Payer: Self-pay | Admitting: Internal Medicine

## 2017-01-31 ENCOUNTER — Ambulatory Visit (INDEPENDENT_AMBULATORY_CARE_PROVIDER_SITE_OTHER)
Admission: RE | Admit: 2017-01-31 | Discharge: 2017-01-31 | Disposition: A | Discharge status: Home / Self Care | Payer: Federal, State, Local not specified - PPO | Source: Ambulatory Visit | Attending: Internal Medicine | Admitting: Internal Medicine

## 2017-01-31 VITALS — BP 122/76 | HR 80 | Temp 98.4°F | Resp 16 | Ht 72.0 in | Wt 163.0 lb

## 2017-01-31 DIAGNOSIS — R059 Cough, unspecified: Secondary | ICD-10-CM

## 2017-01-31 DIAGNOSIS — R05 Cough: Secondary | ICD-10-CM | POA: Diagnosis not present

## 2017-01-31 DIAGNOSIS — R69 Illness, unspecified: Secondary | ICD-10-CM

## 2017-01-31 DIAGNOSIS — J111 Influenza due to unidentified influenza virus with other respiratory manifestations: Secondary | ICD-10-CM

## 2017-01-31 DIAGNOSIS — J431 Panlobular emphysema: Secondary | ICD-10-CM

## 2017-01-31 MED ORDER — PROMETHAZINE-DM 6.25-15 MG/5ML PO SYRP: 5.0000 mL | ORAL_SOLUTION | Freq: Four times a day (QID) | ORAL | 0 refills | Status: DC | PRN

## 2017-01-31 MED ORDER — UMECLIDINIUM-VILANTEROL 62.5-25 MCG/INH IN AEPB: 1.0000 | INHALATION_SPRAY | Freq: Every day | RESPIRATORY_TRACT | 1 refills | Status: DC

## 2017-01-31 NOTE — Progress Notes (Signed)
Subjective:  Patient ID: Travis Hicks, male    DOB: 11-08-1950  Age: 66 y.o. MRN: [id number]  CC: Cough   HPI Travis Hicks presents for concerns about a cough.  He tells me that he has a chronic, recurrent cough and intermittent wheezing due to a long-standing history of tobacco abuse but for the last 4 days he has had new symptoms that include a cough that is rarely productive of clear phlegm with chills, low-grade fever to 100.8, shortness of breath, and wheezing.  He took Motrin and says the fever defervesced.  He denies sore throat, chest pain, or hemoptysis.  Outpatient Medications Prior to Visit  Medication Sig Dispense Refill  . aspirin 81 MG tablet Take 81 mg by mouth daily.    Marland Kitchen b complex vitamins tablet Take 1 tablet by mouth daily. 100 tablet 3  . latanoprost (XALATAN) 0.005 % ophthalmic solution     . tadalafil (CIALIS) 20 MG tablet Take 1 tablet (20 mg total) by mouth daily as needed for erectile dysfunction. 30 tablet 3  . TURMERIC PO Take by mouth.    . cholecalciferol (VITAMIN D) 1000 UNITS tablet Take 1 tablet (1,000 Units total) by mouth daily. 100 tablet 3   No facility-administered medications prior to visit.     ROS Review of Systems  Constitutional: Positive for chills, fatigue and fever. Negative for appetite change and diaphoresis.  HENT: Negative.  Negative for facial swelling, sinus pressure, sore throat and trouble swallowing.   Eyes: Negative.   Respiratory: Positive for cough, shortness of breath and wheezing. Negative for stridor.   Cardiovascular: Negative for chest pain, palpitations and leg swelling.  Gastrointestinal: Negative for abdominal pain, constipation, diarrhea, nausea and vomiting.  Endocrine: Negative.   Genitourinary: Negative.  Negative for difficulty urinating and dysuria.  Musculoskeletal: Negative.  Negative for back pain, myalgias and neck pain.  Skin: Negative.  Negative for color change and rash.  Neurological: Negative.   Negative for dizziness.  Hematological: Negative for adenopathy. Does not bruise/bleed easily.  Psychiatric/Behavioral: Negative.     Objective:  BP 122/76 (BP Location: Left Arm, Patient Position: Sitting, Cuff Size: Large)   Pulse 80   Temp 98.4 F (36.9 C) (Oral)   Resp 16   Ht 6' (1.829 m)   Wt 163 lb (73.9 kg)   SpO2 94%   BMI 22.11 kg/m   BP Readings from Last 3 Encounters:  01/31/17 122/76  10/09/16 120/84  08/21/16 110/74    Wt Readings from Last 3 Encounters:  01/31/17 163 lb (73.9 kg)  10/09/16 160 lb (72.6 kg)  08/21/16 162 lb (73.5 kg)    Physical Exam  Constitutional: He is oriented to person, place, and time.  Non-toxic appearance. He does not have a sickly appearance. He does not appear ill. No distress.  HENT:  Mouth/Throat: Oropharynx is clear and moist. No oropharyngeal exudate.  Eyes: Conjunctivae are normal. Right eye exhibits no discharge. Left eye exhibits no discharge. No scleral icterus.  Neck: Normal range of motion. Neck supple. No JVD present. No thyromegaly present.  Cardiovascular: Normal rate, regular rhythm and normal heart sounds.  No murmur heard. Pulmonary/Chest: Effort normal. No accessory muscle usage. No tachypnea. No respiratory distress. He has no decreased breath sounds. He has wheezes in the left lower field. He has rhonchi in the right middle field and the left middle field. He has no rales. He exhibits no tenderness.  Abdominal: Soft. Bowel sounds are normal. He exhibits no mass. There  is no tenderness. There is no rebound.  Musculoskeletal: Normal range of motion. He exhibits no edema, tenderness or deformity.  Lymphadenopathy:    He has no cervical adenopathy.  Neurological: He is alert and oriented to person, place, and time.  Skin: Skin is warm and dry. No rash noted. No erythema. No pallor.  Vitals reviewed.   Lab Results  Component Value Date   WBC 6.5 08/21/2016   HGB 15.4 08/21/2016   HCT 45.5 08/21/2016   PLT  298.0 08/21/2016   GLUCOSE 96 08/21/2016   CHOL 187 08/21/2016   TRIG 73.0 08/21/2016   HDL 65.50 08/21/2016   LDLCALC 107 (H) 08/21/2016   ALT 11 08/21/2016   AST 15 08/21/2016   NA 137 08/21/2016   K 4.5 08/21/2016   CL 102 08/21/2016   CREATININE 0.91 08/21/2016   BUN 16 08/21/2016   CO2 28 08/21/2016   TSH 1.10 08/21/2016   PSA 1.16 08/21/2016    Dg Lumbar Spine 2-3 Views  Result Date: 10/10/2016 CLINICAL DATA:  Chronic low back pain. EXAM: LUMBAR SPINE - 2-3 VIEW COMPARISON:  CT abdomen and pelvis Jul 12, 2016 FINDINGS: Lumbar vertebral bodies sacralized L5 vertebral body. Grade 1 L4-5 anterolisthesis. No definite spondylolysis. Intervertebral disc heights preserved. Moderate lower lumbar facet arthropathy. No destructive bony lesions. Sacroiliac joints are symmetric. Multiple loops of gas-filled bowel. IMPRESSION: Transitional anatomy, sacralized L5 vertebral body. New grade 1 L4-5 anterolisthesis. Electronically Signed   By: Elon Alas M.D.   On: 10/10/2016 04:02   Dg Chest 2 View  Result Date: 01/31/2017 CLINICAL DATA:  Cough and dyspnea for 5 days.  Smoker. EXAM: CHEST  2 VIEW COMPARISON:  05/23/2011 FINDINGS: The heart size and mediastinal contours are within normal limits. Both lungs are clear. Coarsened interstitial markings of emphysema identified. The visualized skeletal structures are unremarkable. IMPRESSION: 1. No acute cardiopulmonary abnormalities. 2. Interstitial coarsening of emphysema Electronically Signed   By: Kerby Moors M.D.   On: 01/31/2017 10:20     Assessment & Plan:   Fredrico was seen today for cough.  Diagnoses and all orders for this visit:  Cough- His chest x-ray is negative for infiltrate or mass. -     DG Chest 2 View; Future -     promethazine-dextromethorphan (PROMETHAZINE-DM) 6.25-15 MG/5ML syrup; Take 5 mLs by mouth 4 (four) times daily as needed for cough.  Panlobular emphysema ([place])- Will start treating his symptoms with a  LABA/LAMA combination inh.  I gave him a sample of ANORO and showed him how to use it.  He demonstrated proficiency with its use. -     umeclidinium-vilanterol (ANORO ELLIPTA) 62.5-25 MCG/INH AEPB; Inhale 1 puff into the lungs daily.  Influenza-like illness- If this is influenza then he has presented too late to benefit from antiviral therapy.  Will offer symptom relief with Phenergan DM as needed. -     promethazine-dextromethorphan (PROMETHAZINE-DM) 6.25-15 MG/5ML syrup; Take 5 mLs by mouth 4 (four) times daily as needed for cough.   I am having Travis Hicks start on umeclidinium-vilanterol and promethazine-dextromethorphan. I am also having him maintain his aspirin, cholecalciferol, b complex vitamins, TURMERIC PO, tadalafil, and latanoprost.  Meds ordered this encounter  Medications  . umeclidinium-vilanterol (ANORO ELLIPTA) 62.5-25 MCG/INH AEPB    Sig: Inhale 1 puff into the lungs daily.    Dispense:  90 each    Refill:  1  . promethazine-dextromethorphan (PROMETHAZINE-DM) 6.25-15 MG/5ML syrup    Sig: Take 5 mLs by mouth 4 (  four) times daily as needed for cough.    Dispense:  118 mL    Refill:  0     Follow-up: Return in about 3 weeks (around 02/21/2017).  Scarlette Calico, MD

## 2017-01-31 NOTE — Patient Instructions (Signed)
Cough, Adult Coughing is a reflex that clears your throat and your airways. Coughing helps to heal and protect your lungs. It is normal to cough occasionally, but a cough that happens with other symptoms or lasts a long time may be a sign of a condition that needs treatment. A cough may last only 2-3 weeks (acute), or it may last longer than 8 weeks (chronic). What are the causes? Coughing is commonly caused by:  Breathing in substances that irritate your lungs.  A viral or bacterial respiratory infection.  Allergies.  Asthma.  Postnasal drip.  Smoking.  Acid backing up from the stomach into the esophagus (gastroesophageal reflux).  Certain medicines.  Chronic lung problems, including COPD (or rarely, lung cancer).  Other medical conditions such as heart failure.  Follow these instructions at home: Pay attention to any changes in your symptoms. Take these actions to help with your discomfort:  Take medicines only as told by your health care provider. ? If you were prescribed an antibiotic medicine, take it as told by your health care provider. Do not stop taking the antibiotic even if you start to feel better. ? Talk with your health care provider before you take a cough suppressant medicine.  Drink enough fluid to keep your urine clear or pale yellow.  If the air is dry, use a cold steam vaporizer or humidifier in your bedroom or your home to help loosen secretions.  Avoid anything that causes you to cough at work or at home.  If your cough is worse at night, try sleeping in a semi-upright position.  Avoid cigarette smoke. If you smoke, quit smoking. If you need help quitting, ask your health care provider.  Avoid caffeine.  Avoid alcohol.  Rest as needed.  Contact a health care provider if:  You have new symptoms.  You cough up pus.  Your cough does not get better after 2-3 weeks, or your cough gets worse.  You cannot control your cough with suppressant  medicines and you are losing sleep.  You develop pain that is getting worse or pain that is not controlled with pain medicines.  You have a fever.  You have unexplained weight loss.  You have night sweats. Get help right away if:  You cough up blood.  You have difficulty breathing.  Your heartbeat is very fast. This information is not intended to replace advice given to you by your health care provider. Make sure you discuss any questions you have with your health care provider. Document Released: 08/05/2010 Document Revised: 07/15/2015 Document Reviewed: 04/15/2014 Elsevier Interactive Patient Education  2017 [employer] Inc[employer]  

## 2017-03-20 ENCOUNTER — Ambulatory Visit: Payer: Federal, State, Local not specified - PPO | Admitting: Internal Medicine

## 2017-03-20 ENCOUNTER — Encounter: Payer: Self-pay | Admitting: Internal Medicine

## 2017-03-20 VITALS — BP 128/80 | HR 80 | Temp 98.3°F | Resp 16 | Ht 72.0 in | Wt 172.1 lb

## 2017-03-20 DIAGNOSIS — J449 Chronic obstructive pulmonary disease, unspecified: Secondary | ICD-10-CM

## 2017-03-20 DIAGNOSIS — J431 Panlobular emphysema: Secondary | ICD-10-CM | POA: Diagnosis not present

## 2017-03-20 DIAGNOSIS — H402290 Chronic angle-closure glaucoma, unspecified eye, stage unspecified: Secondary | ICD-10-CM | POA: Diagnosis not present

## 2017-03-20 NOTE — Progress Notes (Signed)
Subjective:  Patient ID: Travis Hicks, male    DOB: [birth year]  Age: 67 y.o. MRN: [id number]  CC: COPD   HPI Travis Hicks presents for f/up - I saw him about 6 weeks ago for a cough.  He returns for follow-up and tells me the cough has resolved and he feels much better.  He is using the LABA/LAMA inhaler but is concerned that it may be affecting his vision.  He describes a sensation that his eyes feel tired.  He has a history of glaucoma and is concerned the inhaler may be affecting his glaucoma.  He denies any eye pain or decreased visual acuity.  Outpatient Medications Prior to Visit  Medication Sig Dispense Refill  . aspirin 81 MG tablet Take 81 mg by mouth daily.    Marland Kitchen b complex vitamins tablet Take 1 tablet by mouth daily. 100 tablet 3  . latanoprost (XALATAN) 0.005 % ophthalmic solution     . tadalafil (CIALIS) 20 MG tablet Take 1 tablet (20 mg total) by mouth daily as needed for erectile dysfunction. 30 tablet 3  . TURMERIC PO Take by mouth.    . cholecalciferol (VITAMIN D) 1000 UNITS tablet Take 1 tablet (1,000 Units total) by mouth daily. 100 tablet 3  . promethazine-dextromethorphan (PROMETHAZINE-DM) 6.25-15 MG/5ML syrup Take 5 mLs by mouth 4 (four) times daily as needed for cough. 118 mL 0  . umeclidinium-vilanterol (ANORO ELLIPTA) 62.5-25 MCG/INH AEPB Inhale 1 puff into the lungs daily. 90 each 1   No facility-administered medications prior to visit.     ROS Review of Systems  Constitutional: Negative.  Negative for appetite change, diaphoresis, fatigue and fever.  HENT: Negative.   Eyes: Negative for photophobia, pain, redness, itching and visual disturbance.  Respiratory: Negative.  Negative for cough, chest tightness, shortness of breath and wheezing.   Cardiovascular: Negative for chest pain, palpitations and leg swelling.  Gastrointestinal: Negative for abdominal pain, constipation, diarrhea, nausea and vomiting.  Endocrine: Negative.   Genitourinary: Negative.   Negative for difficulty urinating.  Musculoskeletal: Negative for arthralgias and myalgias.  Skin: Negative.  Negative for color change and rash.  Allergic/Immunologic: Negative.   Neurological: Negative.  Negative for dizziness, weakness and light-headedness.  Hematological: Negative for adenopathy. Does not bruise/bleed easily.  Psychiatric/Behavioral: Negative.     Objective:  BP 128/80 (BP Location: Right Arm, Patient Position: Sitting, Cuff Size: Normal)   Pulse 80   Temp 98.3 F (36.8 C) (Oral)   Resp 16   Ht 6' (1.829 m)   Wt 172 lb 1.9 oz (78.1 kg)   SpO2 95%   BMI 23.34 kg/m   BP Readings from Last 3 Encounters:  03/20/17 128/80  01/31/17 122/76  10/09/16 120/84    Wt Readings from Last 3 Encounters:  03/20/17 172 lb 1.9 oz (78.1 kg)  01/31/17 163 lb (73.9 kg)  10/09/16 160 lb (72.6 kg)    Physical Exam  Constitutional: He is oriented to person, place, and time. No distress.  HENT:  Mouth/Throat: Oropharynx is clear and moist. No oropharyngeal exudate.  Eyes: Conjunctivae and EOM are normal. Pupils are equal, round, and reactive to light. Left eye exhibits no discharge. Right conjunctiva is not injected. Left conjunctiva is not injected. No scleral icterus.  Neck: Normal range of motion. Neck supple. No JVD present. No thyromegaly present.  Cardiovascular: Normal rate, regular rhythm and normal heart sounds. Exam reveals no gallop.  No murmur heard. Pulmonary/Chest: Effort normal and breath sounds normal. No respiratory distress.  He has no wheezes. He has no rales.  Abdominal: Soft. Bowel sounds are normal. He exhibits no distension and no mass.  Musculoskeletal: Normal range of motion. He exhibits no edema.  Lymphadenopathy:    He has no cervical adenopathy.  Neurological: He is alert and oriented to person, place, and time.  Skin: Skin is warm and dry. No rash noted. He is not diaphoretic. No erythema. No pallor.  Vitals reviewed.   Lab Results    Component Value Date   WBC 6.5 08/21/2016   HGB 15.4 08/21/2016   HCT 45.5 08/21/2016   PLT 298.0 08/21/2016   GLUCOSE 96 08/21/2016   CHOL 187 08/21/2016   TRIG 73.0 08/21/2016   HDL 65.50 08/21/2016   LDLCALC 107 (H) 08/21/2016   ALT 11 08/21/2016   AST 15 08/21/2016   NA 137 08/21/2016   K 4.5 08/21/2016   CL 102 08/21/2016   CREATININE 0.91 08/21/2016   BUN 16 08/21/2016   CO2 28 08/21/2016   TSH 1.10 08/21/2016   PSA 1.16 08/21/2016    Dg Chest 2 View  Result Date: 01/31/2017 CLINICAL DATA:  Cough and dyspnea for 5 days.  Smoker. EXAM: CHEST  2 VIEW COMPARISON:  05/23/2011 FINDINGS: The heart size and mediastinal contours are within normal limits. Both lungs are clear. Coarsened interstitial markings of emphysema identified. The visualized skeletal structures are unremarkable. IMPRESSION: 1. No acute cardiopulmonary abnormalities. 2. Interstitial coarsening of emphysema Electronically Signed   By: Kerby Moors M.D.   On: 01/31/2017 10:20    Assessment & Plan:   Travis Hicks was seen today for copd.  Diagnoses and all orders for this visit:  Chronic primary angle-closure glaucoma, unspecified glaucoma stage, unspecified laterality- He complains that his eyes feel tired but denies visual disturbance or ocular pain.  I do not think his symptoms are related to glaucoma but I have asked him to see his ophthalmologist and if his eye pressures have increased then I will consider discontinuing the LABA/LAMA inhaler but at this time I am not convinced that his symptoms are related to the inhaler. -     Ambulatory referral to Ophthalmology  Panlobular emphysema ([place]) -     umeclidinium-vilanterol (ANORO ELLIPTA) 62.5-25 MCG/INH AEPB; Inhale 1 puff into the lungs daily.  Chronic obstructive pulmonary disease, unspecified COPD type ([place])- He is doing well on the current inhaler.  Will continue. -     umeclidinium-vilanterol (ANORO ELLIPTA) 62.5-25 MCG/INH AEPB; Inhale 1 puff into the  lungs daily.   I have discontinued Travis Hicks's promethazine-dextromethorphan. I am also having him maintain his aspirin, cholecalciferol, b complex vitamins, TURMERIC PO, tadalafil, latanoprost, and umeclidinium-vilanterol.  Meds ordered this encounter  Medications  . umeclidinium-vilanterol (ANORO ELLIPTA) 62.5-25 MCG/INH AEPB    Sig: Inhale 1 puff into the lungs daily.    Dispense:  90 each    Refill:  1     Follow-up: Return if symptoms worsen or fail to improve.  Scarlette Calico, MD

## 2017-03-20 NOTE — Patient Instructions (Signed)

## 2017-03-21 MED ORDER — UMECLIDINIUM-VILANTEROL 62.5-25 MCG/INH IN AEPB: 1.0000 | INHALATION_SPRAY | Freq: Every day | RESPIRATORY_TRACT | 1 refills | Status: DC

## 2017-06-22 ENCOUNTER — Other Ambulatory Visit: Payer: Self-pay | Admitting: Internal Medicine

## 2017-06-22 DIAGNOSIS — J431 Panlobular emphysema: Secondary | ICD-10-CM

## 2017-06-22 DIAGNOSIS — J449 Chronic obstructive pulmonary disease, unspecified: Secondary | ICD-10-CM

## 2018-01-08 ENCOUNTER — Ambulatory Visit (INDEPENDENT_AMBULATORY_CARE_PROVIDER_SITE_OTHER): Payer: Federal, State, Local not specified - PPO | Admitting: Internal Medicine

## 2018-01-08 ENCOUNTER — Encounter: Payer: Self-pay | Admitting: Internal Medicine

## 2018-01-08 ENCOUNTER — Other Ambulatory Visit (INDEPENDENT_AMBULATORY_CARE_PROVIDER_SITE_OTHER): Payer: Federal, State, Local not specified - PPO

## 2018-01-08 VITALS — BP 132/82 | HR 81 | Temp 97.7°F | Ht 72.0 in | Wt 177.0 lb

## 2018-01-08 DIAGNOSIS — R935 Abnormal findings on diagnostic imaging of other abdominal regions, including retroperitoneum: Secondary | ICD-10-CM

## 2018-01-08 DIAGNOSIS — F17200 Nicotine dependence, unspecified, uncomplicated: Secondary | ICD-10-CM | POA: Diagnosis not present

## 2018-01-08 DIAGNOSIS — E559 Vitamin D deficiency, unspecified: Secondary | ICD-10-CM

## 2018-01-08 DIAGNOSIS — J449 Chronic obstructive pulmonary disease, unspecified: Secondary | ICD-10-CM

## 2018-01-08 DIAGNOSIS — Z0000 Encounter for general adult medical examination without abnormal findings: Secondary | ICD-10-CM

## 2018-01-08 DIAGNOSIS — J431 Panlobular emphysema: Secondary | ICD-10-CM

## 2018-01-08 DIAGNOSIS — H6123 Impacted cerumen, bilateral: Secondary | ICD-10-CM | POA: Diagnosis not present

## 2018-01-08 DIAGNOSIS — F1020 Alcohol dependence, uncomplicated: Secondary | ICD-10-CM

## 2018-01-08 LAB — CBC WITH DIFFERENTIAL/PLATELET
Basophils Absolute: 0 10*3/uL (ref 0.0–0.1)
Basophils Relative: 0.7 % (ref 0.0–3.0)
EOS ABS: 0.1 10*3/uL (ref 0.0–0.7)
Eosinophils Relative: 1.3 % (ref 0.0–5.0)
HCT: 47.5 % (ref 39.0–52.0)
HEMOGLOBIN: 16.1 g/dL (ref 13.0–17.0)
LYMPHS ABS: 1.9 10*3/uL (ref 0.7–4.0)
LYMPHS PCT: 26.3 % (ref 12.0–46.0)
MCHC: 33.9 g/dL (ref 30.0–36.0)
MCV: 95.5 fl (ref 78.0–100.0)
MONO ABS: 0.7 10*3/uL (ref 0.1–1.0)
MONOS PCT: 10.4 % (ref 3.0–12.0)
NEUTROS PCT: 61.3 % (ref 43.0–77.0)
Neutro Abs: 4.3 10*3/uL (ref 1.4–7.7)
Platelets: 245 10*3/uL (ref 150.0–400.0)
RBC: 4.98 Mil/uL (ref 4.22–5.81)
RDW: 14.9 % (ref 11.5–15.5)
WBC: 7.1 10*3/uL (ref 4.0–10.5)

## 2018-01-08 LAB — HEPATIC FUNCTION PANEL
ALBUMIN: 4.3 g/dL (ref 3.5–5.2)
ALK PHOS: 50 U/L (ref 39–117)
ALT: 19 U/L (ref 0–53)
AST: 26 U/L (ref 0–37)
Bilirubin, Direct: 0.2 mg/dL (ref 0.0–0.3)
TOTAL PROTEIN: 6.7 g/dL (ref 6.0–8.3)
Total Bilirubin: 0.9 mg/dL (ref 0.2–1.2)

## 2018-01-08 LAB — TSH: TSH: 1.75 u[IU]/mL (ref 0.35–4.50)

## 2018-01-08 LAB — BASIC METABOLIC PANEL
BUN: 6 mg/dL (ref 6–23)
CALCIUM: 9.7 mg/dL (ref 8.4–10.5)
CO2: 32 mEq/L (ref 19–32)
CREATININE: 0.81 mg/dL (ref 0.40–1.50)
Chloride: 102 mEq/L (ref 96–112)
GFR: 122.06 mL/min (ref >60.00)
Glucose, Bld: 108 mg/dL — ABNORMAL HIGH (ref 70–99)
Potassium: 3.3 mEq/L — ABNORMAL LOW (ref 3.5–5.1)
Sodium: 142 mEq/L (ref 135–145)

## 2018-01-08 LAB — LIPID PANEL
CHOLESTEROL: 177 mg/dL (ref 0–200)
HDL: 83.8 mg/dL (ref >39.00)
LDL Cholesterol: 67 mg/dL (ref 0–99)
NonHDL: 92.81
Total CHOL/HDL Ratio: 2
Triglycerides: 130 mg/dL (ref 0.0–149.0)
VLDL: 26 mg/dL (ref 0.0–40.0)

## 2018-01-08 LAB — PSA: PSA: 0.7 ng/mL (ref 0.10–4.00)

## 2018-01-08 MED ORDER — UMECLIDINIUM-VILANTEROL 62.5-25 MCG/INH IN AEPB: INHALATION_SPRAY | RESPIRATORY_TRACT | 3 refills | Status: DC

## 2018-01-08 NOTE — Progress Notes (Signed)
Subjective:  Patient ID: Travis Hicks, male    DOB: 1950/07/29  Age: 67 y.o. MRN: [id number]  CC: No chief complaint on file.   HPI Travis Hicks presents for a well exam F/u COPD, remote abn CT/MRI pancreas  Outpatient Medications Prior to Visit  Medication Sig Dispense Refill  . ANORO ELLIPTA 62.5-25 MCG/INH AEPB TAKE 1 PUFF BY MOUTH EVERY DAY 180 each 1  . aspirin 81 MG tablet Take 81 mg by mouth daily.    Marland Kitchen b complex vitamins tablet Take 1 tablet by mouth daily. 100 tablet 3  . latanoprost (XALATAN) 0.005 % ophthalmic solution     . tadalafil (CIALIS) 20 MG tablet Take 1 tablet (20 mg total) by mouth daily as needed for erectile dysfunction. 30 tablet 3  . TURMERIC PO Take by mouth.    . cholecalciferol (VITAMIN D) 1000 UNITS tablet Take 1 tablet (1,000 Units total) by mouth daily. 100 tablet 3   No facility-administered medications prior to visit.     ROS: Review of Systems  Constitutional: Negative for appetite change, fatigue and unexpected weight change.  HENT: Negative for congestion, nosebleeds, sneezing, sore throat and trouble swallowing.   Eyes: Negative for itching and visual disturbance.  Respiratory: Negative for cough.   Cardiovascular: Negative for chest pain, palpitations and leg swelling.  Gastrointestinal: Negative for abdominal distention, blood in stool, diarrhea and nausea.  Genitourinary: Positive for frequency. Negative for hematuria.  Musculoskeletal: Negative for back pain, gait problem, joint swelling and neck pain.  Skin: Negative for rash.  Neurological: Negative for dizziness, tremors, speech difficulty and weakness.  Psychiatric/Behavioral: Negative for agitation, dysphoric mood and sleep disturbance. The patient is not nervous/anxious.     Objective:  BP 132/82 (BP Location: Left Arm, Patient Position: Sitting, Cuff Size: Normal)   Pulse 81   Temp 97.7 F (36.5 C) (Oral)   Ht 6' (1.829 m)   Wt 177 lb (80.3 kg)   SpO2 97%   BMI 24.01  kg/m   BP Readings from Last 3 Encounters:  01/08/18 132/82  03/20/17 128/80  01/31/17 122/76    Wt Readings from Last 3 Encounters:  01/08/18 177 lb (80.3 kg)  03/20/17 172 lb 1.9 oz (78.1 kg)  01/31/17 163 lb (73.9 kg)    Physical Exam  Constitutional: He is oriented to person, place, and time. He appears well-developed. No distress.  NAD  HENT:  Mouth/Throat: Oropharynx is clear and moist.  Eyes: Pupils are equal, round, and reactive to light. Conjunctivae are normal.  Neck: Normal range of motion. No JVD present. No thyromegaly present.  Cardiovascular: Normal rate, regular rhythm, normal heart sounds and intact distal pulses. Exam reveals no gallop and no friction rub.  No murmur heard. Pulmonary/Chest: Effort normal and breath sounds normal. No respiratory distress. He has no wheezes. He has no rales. He exhibits no tenderness.  Abdominal: Soft. Bowel sounds are normal. He exhibits no distension and no mass. There is no tenderness. There is no rebound and no guarding.  Musculoskeletal: Normal range of motion. He exhibits no edema or tenderness.  Lymphadenopathy:    He has no cervical adenopathy.  Neurological: He is alert and oriented to person, place, and time. He has normal reflexes. No cranial nerve deficit. He exhibits normal muscle tone. He displays a negative Romberg sign. Coordination and gait normal.  Skin: Skin is warm and dry. No rash noted.  Psychiatric: He has a normal mood and affect. His behavior is normal. Judgment and thought  content normal.  B wax Pt declined rectal exam   Procedure Note :     Procedure :  Ear irrigation   Indication:  Cerumen impaction B   Risks, including pain, dizziness, eardrum perforation, bleeding, infection and others as well as benefits were explained to the patient in detail. Verbal consent was obtained and the patient agreed to proceed.    We used "The Elephant Ear Irrigation Device" filled with lukewarm water for  irrigation. A large amount wax was recovered. Procedure has also required manual wax removal with an ear wax curette and ear forceps.   Tolerated well. Complications: None.   Postprocedure instructions :  Call if problems.    Lab Results  Component Value Date   WBC 6.5 08/21/2016   HGB 15.4 08/21/2016   HCT 45.5 08/21/2016   PLT 298.0 08/21/2016   GLUCOSE 96 08/21/2016   CHOL 187 08/21/2016   TRIG 73.0 08/21/2016   HDL 65.50 08/21/2016   LDLCALC 107 (H) 08/21/2016   ALT 11 08/21/2016   AST 15 08/21/2016   NA 137 08/21/2016   K 4.5 08/21/2016   CL 102 08/21/2016   CREATININE 0.91 08/21/2016   BUN 16 08/21/2016   CO2 28 08/21/2016   TSH 1.10 08/21/2016   PSA 1.16 08/21/2016    Dg Chest 2 View  Result Date: 01/31/2017 CLINICAL DATA:  Cough and dyspnea for 5 days.  Smoker. EXAM: CHEST  2 VIEW COMPARISON:  05/23/2011 FINDINGS: The heart size and mediastinal contours are within normal limits. Both lungs are clear. Coarsened interstitial markings of emphysema identified. The visualized skeletal structures are unremarkable. IMPRESSION: 1. No acute cardiopulmonary abnormalities. 2. Interstitial coarsening of emphysema Electronically Signed   By: Kerby Moors M.D.   On: 01/31/2017 10:20    Assessment & Plan:   There are no diagnoses linked to this encounter.   No orders of the defined types were placed in this encounter.    Follow-up: No follow-ups on file.  Walker Kehr, MD

## 2018-01-08 NOTE — Assessment & Plan Note (Signed)
Dry now 2019

## 2018-01-08 NOTE — Patient Instructions (Signed)

## 2018-01-08 NOTE — Assessment & Plan Note (Signed)
2005 benign - had a PET CT  F/u CT in 2007, 2015 ok  I do not think we need another CT

## 2018-01-08 NOTE — Assessment & Plan Note (Signed)
See procedure 

## 2018-01-08 NOTE — Assessment & Plan Note (Signed)
Discussed smoking 3/4 ppd

## 2018-01-08 NOTE — Assessment & Plan Note (Addendum)
We discussed age appropriate health related issues, including available/recomended screening tests and vaccinations. We discussed a need for adhering to healthy diet and exercise. Labs were ordered to be later reviewed . All questions were answered. Car CT calcium info Colon is due in 2020 Dr Fuller Plan Declined all shots

## 2018-01-08 NOTE — Assessment & Plan Note (Signed)
Vit D 

## 2018-01-09 LAB — URINALYSIS, ROUTINE W REFLEX MICROSCOPIC
Bilirubin Urine: NEGATIVE
KETONES UR: NEGATIVE
Leukocytes, UA: NEGATIVE
Nitrite: NEGATIVE
PH: 5.5 (ref 5.0–8.0)
RBC / HPF: NONE SEEN (ref 0–?)
Total Protein, Urine: NEGATIVE
URINE GLUCOSE: NEGATIVE
Urobilinogen, UA: 0.2 (ref 0.0–1.0)
WBC, UA: NONE SEEN (ref 0–?)

## 2018-01-10 ENCOUNTER — Other Ambulatory Visit: Payer: Self-pay | Admitting: Internal Medicine

## 2018-01-10 MED ORDER — POTASSIUM CHLORIDE ER 10 MEQ PO TBCR: 10.0000 meq | EXTENDED_RELEASE_TABLET | Freq: Every day | ORAL | 3 refills | Status: DC

## 2018-08-15 ENCOUNTER — Other Ambulatory Visit: Payer: Self-pay

## 2018-08-15 ENCOUNTER — Encounter: Payer: Self-pay | Admitting: Internal Medicine

## 2018-08-15 ENCOUNTER — Ambulatory Visit: Payer: Self-pay | Admitting: *Deleted

## 2018-08-15 ENCOUNTER — Ambulatory Visit (INDEPENDENT_AMBULATORY_CARE_PROVIDER_SITE_OTHER): Payer: Federal, State, Local not specified - PPO | Admitting: Internal Medicine

## 2018-08-15 DIAGNOSIS — R030 Elevated blood-pressure reading, without diagnosis of hypertension: Secondary | ICD-10-CM | POA: Diagnosis not present

## 2018-08-15 NOTE — Telephone Encounter (Signed)
Saw dentist on Tuesday, b/p high 148/101 at the dentist. Yesterday 148/98. Today 146/97 and has a bothersome pain on the left side of his head. Occurred yesterday also. Had a total of 12 upper teeth removed on Tuesday.Denies ear pain/fever/ST. All bleeding resolved yesterday. No SOB/CP/fever during this time. No nasal congestion/sneezing/coughing during this time. No vision changes/denies dizziness. Advised tylenol/advil for discomfort. Transferred to PCP for appointment.  Reason for Disposition . [0] Systolic BP  >= 354 OR Diastolic >= 80 AND [6] not taking BP medications  Answer Assessment - Initial Assessment Questions 1. BLOOD PRESSURE: "What is the blood pressure?" "Did you take at least two measurements 5 minutes apart?"     146/97 this hour. 148/98 earlier today and about the same yesterday. 2. ONSET: "When did you take your blood pressure?"    today 3. HOW: "How did you obtain the blood pressure?" (e.g., visiting nurse, automatic home BP monitor)     Home cuff, 2 different cuffs 4. HISTORY: "Do you have a history of high blood pressure?"    No  5. MEDICATIONS: "Are you taking any medications for blood pressure?" "Have you missed any doses recently?"     No  6. OTHER SYMPTOMS: "Do you have any symptoms?" (e.g., headache, chest pain, blurred vision, difficulty breathing, weakness)     Denies all of these.  7. PREGNANCY: "Is there any chance you are pregnant?" "When was your last menstrual period?"     na  Protocols used: HIGH BLOOD PRESSURE-A-AH

## 2018-08-15 NOTE — Telephone Encounter (Signed)
Appointment made

## 2018-08-15 NOTE — Assessment & Plan Note (Signed)
Suspect likely due to anxiety about 12 tooth extractions and then pain post operatively. Asked him to increase water and decrease salt and monitor BP 2 times per day. Call back in 3-4 days if readings not coming down or sooner for any symptoms.

## 2018-08-15 NOTE — Progress Notes (Signed)
Virtual Visit via Video Note  I connected with Travis Hicks on 08/15/18 at  3:00 PM EDT by a video enabled telemedicine application and verified that I am speaking with the correct person using two identifiers.  The patient and the provider were at separate locations throughout the entire encounter.   I discussed the limitations of evaluation and management by telemedicine and the availability of in person appointments. The patient expressed understanding and agreed to proceed.  History of Present Illness: The patient is a 68 y.o. man with visit for high blood pressure. Started Tuesday when he was at the dentist to get 12 teeth pulled. They did this with local aneasthetic and pulled all the teeth. He had pain in the area afterwards. Wednesday the mouth was hurting pretty good. He was not given anything for pain but used tylenol and some advil. He was bleeding some Wednesday as well. Today the pain is improving and bleeding slowing down. Did check BP at dentist and it was high, he has ben monitoring at home and running 140s/90s-100. He is used to having readings more like 70-80 from the bottom number. Has no headaches although some pain in his jaw from the extractions. Denies chest pains or SOB. Denies nausea or vomiting. Eating okay and drinking okay. Overall it is stable. He has not checked BP yet today. Has tried nothing. Has a fairly high salt diet but no different than usual.   Observations/Objective: Appearance: normal, breathing appears normal, some swelling around the jaw and gums no visible bleeding currently, casual grooming, abdomen does not appear distended, throat normal, memory normal, mental status is A and O times 3  Assessment and Plan: See problem oriented charting  Follow Up Instructions: monitor BP, DASH diet, tylenol prn pain, if still elevated Monday/Tuesday call back  I discussed the assessment and treatment plan with the patient. The patient was provided an opportunity to ask  questions and all were answered. The patient agreed with the plan and demonstrated an understanding of the instructions.   The patient was advised to call back or seek an in-person evaluation if the symptoms worsen or if the condition fails to improve as anticipated.  Hoyt Koch, MD

## 2019-01-14 ENCOUNTER — Other Ambulatory Visit: Payer: Self-pay

## 2019-01-14 ENCOUNTER — Encounter: Payer: Self-pay | Admitting: Internal Medicine

## 2019-01-14 ENCOUNTER — Other Ambulatory Visit (INDEPENDENT_AMBULATORY_CARE_PROVIDER_SITE_OTHER): Payer: Federal, State, Local not specified - PPO

## 2019-01-14 ENCOUNTER — Ambulatory Visit (INDEPENDENT_AMBULATORY_CARE_PROVIDER_SITE_OTHER): Payer: Federal, State, Local not specified - PPO | Admitting: Internal Medicine

## 2019-01-14 VITALS — BP 132/78 | HR 83 | Temp 98.0°F | Ht 72.0 in | Wt 153.0 lb

## 2019-01-14 DIAGNOSIS — Z125 Encounter for screening for malignant neoplasm of prostate: Secondary | ICD-10-CM | POA: Diagnosis not present

## 2019-01-14 DIAGNOSIS — F1020 Alcohol dependence, uncomplicated: Secondary | ICD-10-CM | POA: Diagnosis not present

## 2019-01-14 DIAGNOSIS — E785 Hyperlipidemia, unspecified: Secondary | ICD-10-CM | POA: Diagnosis not present

## 2019-01-14 DIAGNOSIS — Z0000 Encounter for general adult medical examination without abnormal findings: Secondary | ICD-10-CM | POA: Diagnosis not present

## 2019-01-14 DIAGNOSIS — F17200 Nicotine dependence, unspecified, uncomplicated: Secondary | ICD-10-CM

## 2019-01-14 DIAGNOSIS — R634 Abnormal weight loss: Secondary | ICD-10-CM | POA: Diagnosis not present

## 2019-01-14 LAB — HEPATIC FUNCTION PANEL
ALT: 14 U/L (ref 0–53)
AST: 22 U/L (ref 0–37)
Albumin: 3.5 g/dL (ref 3.5–5.2)
Alkaline Phosphatase: 58 U/L (ref 39–117)
Bilirubin, Direct: 0.3 mg/dL (ref 0.0–0.3)
Total Bilirubin: 1.6 mg/dL — ABNORMAL HIGH (ref 0.2–1.2)
Total Protein: 6 g/dL (ref 6.0–8.3)

## 2019-01-14 LAB — URINALYSIS, ROUTINE W REFLEX MICROSCOPIC
Bilirubin Urine: NEGATIVE
Ketones, ur: NEGATIVE
Leukocytes,Ua: NEGATIVE
Nitrite: NEGATIVE
Specific Gravity, Urine: 1.02 (ref 1.000–1.030)
Total Protein, Urine: NEGATIVE
Urine Glucose: NEGATIVE
Urobilinogen, UA: 0.2 (ref 0.0–1.0)
pH: 6 (ref 5.0–8.0)

## 2019-01-14 LAB — LIPID PANEL
Cholesterol: 196 mg/dL (ref 0–200)
HDL: 121.7 mg/dL (ref >39.00)
LDL Cholesterol: 60 mg/dL (ref 0–99)
NonHDL: 74.38
Total CHOL/HDL Ratio: 2
Triglycerides: 71 mg/dL (ref 0.0–149.0)
VLDL: 14.2 mg/dL (ref 0.0–40.0)

## 2019-01-14 LAB — CBC WITH DIFFERENTIAL/PLATELET
Basophils Absolute: 0 10*3/uL (ref 0.0–0.1)
Basophils Relative: 0.4 % (ref 0.0–3.0)
Eosinophils Absolute: 0 10*3/uL (ref 0.0–0.7)
Eosinophils Relative: 0.4 % (ref 0.0–5.0)
HCT: 43 % (ref 39.0–52.0)
Hemoglobin: 14.6 g/dL (ref 13.0–17.0)
Lymphocytes Relative: 22.3 % (ref 12.0–46.0)
Lymphs Abs: 1.3 10*3/uL (ref 0.7–4.0)
MCHC: 34 g/dL (ref 30.0–36.0)
MCV: 101.7 fl — ABNORMAL HIGH (ref 78.0–100.0)
Monocytes Absolute: 0.6 10*3/uL (ref 0.1–1.0)
Monocytes Relative: 10.5 % (ref 3.0–12.0)
Neutro Abs: 3.9 10*3/uL (ref 1.4–7.7)
Neutrophils Relative %: 66.4 % (ref 43.0–77.0)
Platelets: 249 10*3/uL (ref 150.0–400.0)
RBC: 4.23 Mil/uL (ref 4.22–5.81)
RDW: 15.1 % (ref 11.5–15.5)
WBC: 5.9 10*3/uL (ref 4.0–10.5)

## 2019-01-14 LAB — PSA: PSA: 0.87 ng/mL (ref 0.10–4.00)

## 2019-01-14 LAB — BASIC METABOLIC PANEL
BUN: 6 mg/dL (ref 6–23)
CO2: 35 mEq/L — ABNORMAL HIGH (ref 19–32)
Calcium: 9 mg/dL (ref 8.4–10.5)
Chloride: 96 mEq/L (ref 96–112)
Creatinine, Ser: 0.72 mg/dL (ref 0.40–1.50)
GFR: 131.17 mL/min (ref >60.00)
Glucose, Bld: 109 mg/dL — ABNORMAL HIGH (ref 70–99)
Potassium: 2.5 mEq/L — CL (ref 3.5–5.1)
Sodium: 140 mEq/L (ref 135–145)

## 2019-01-14 LAB — TSH: TSH: 1.63 u[IU]/mL (ref 0.35–4.50)

## 2019-01-14 MED ORDER — VITAMIN D3 50 MCG (2000 UT) PO CAPS: 2000.0000 [IU] | ORAL_CAPSULE | Freq: Every day | ORAL | 3 refills | Status: DC

## 2019-01-14 NOTE — Assessment & Plan Note (Addendum)
We discussed age appropriate health related issues, including available/recomended screening tests and vaccinations. We discussed a need for adhering to healthy diet and exercise. Labs were ordered to be later reviewed . All questions were answered. Coronary CT calcium info given 2019, 2020 Colon is due in 2020 Dr Fuller Plan - pt is in contact w/his office Declined all shots

## 2019-01-14 NOTE — Assessment & Plan Note (Signed)
S/p 14 teeth extraction in 6/20. Pt lost wt.Travis KitchenMarland Hicks

## 2019-01-14 NOTE — Assessment & Plan Note (Signed)
1 ppd - discussed °

## 2019-01-14 NOTE — Patient Instructions (Signed)

## 2019-01-14 NOTE — Assessment & Plan Note (Signed)
Drinking 1 pint of vodka a week

## 2019-01-14 NOTE — Progress Notes (Signed)
Subjective:  Patient ID: Travis Hicks, male    DOB: [birth year]  Age: 68 y.o. MRN: [id number]  CC: No chief complaint on file.   HPI Travis Hicks presents for a well exam S/p 14 teeth extraction in 6/20. Pt lost wt... Smoker 1 ppd  Outpatient Medications Prior to Visit  Medication Sig Dispense Refill  . aspirin 81 MG tablet Take 81 mg by mouth daily.    Marland Kitchen b complex vitamins tablet Take 1 tablet by mouth daily. 100 tablet 3  . latanoprost (XALATAN) 0.005 % ophthalmic solution     . potassium chloride (KLOR-CON 10) 10 MEQ tablet Take 1 tablet (10 mEq total) by mouth daily. 90 tablet 3  . tadalafil (CIALIS) 20 MG tablet Take 1 tablet (20 mg total) by mouth daily as needed for erectile dysfunction. 30 tablet 3  . umeclidinium-vilanterol (ANORO ELLIPTA) 62.5-25 MCG/INH AEPB TAKE 1 PUFF BY MOUTH EVERY DAY 3 each 3  . cholecalciferol (VITAMIN D) 1000 UNITS tablet Take 1 tablet (1,000 Units total) by mouth daily. 100 tablet 3  . TURMERIC PO Take by mouth.     No facility-administered medications prior to visit.     ROS: Review of Systems  Constitutional: Positive for unexpected weight change. Negative for appetite change and fatigue.  HENT: Negative for congestion, nosebleeds, sneezing, sore throat and trouble swallowing.   Eyes: Negative for itching and visual disturbance.  Respiratory: Negative for cough.   Cardiovascular: Negative for chest pain, palpitations and leg swelling.  Gastrointestinal: Negative for abdominal distention, blood in stool, diarrhea and nausea.  Genitourinary: Negative for frequency and hematuria.  Musculoskeletal: Negative for back pain, gait problem, joint swelling and neck pain.  Skin: Negative for rash.  Neurological: Negative for dizziness, tremors, speech difficulty and weakness.  Psychiatric/Behavioral: Negative for agitation, dysphoric mood, sleep disturbance and suicidal ideas. The patient is not nervous/anxious.     Objective:  BP 132/78 (BP  Location: Left Arm, Patient Position: Sitting, Cuff Size: Normal)   Pulse 83   Temp 98 F (36.7 C) (Oral)   Ht 6' (1.829 m)   Wt 153 lb (69.4 kg)   SpO2 98%   BMI 20.75 kg/m   BP Readings from Last 3 Encounters:  01/14/19 132/78  01/08/18 132/82  03/20/17 128/80    Wt Readings from Last 3 Encounters:  01/14/19 153 lb (69.4 kg)  01/08/18 177 lb (80.3 kg)  03/20/17 172 lb 1.9 oz (78.1 kg)    Physical Exam Constitutional:      General: He is not in acute distress.    Appearance: He is well-developed.     Comments: NAD  Eyes:     Conjunctiva/sclera: Conjunctivae normal.     Pupils: Pupils are equal, round, and reactive to light.  Neck:     Musculoskeletal: Normal range of motion.     Thyroid: No thyromegaly.     Vascular: No JVD.  Cardiovascular:     Rate and Rhythm: Normal rate and regular rhythm.     Heart sounds: Normal heart sounds. No murmur. No friction rub. No gallop.   Pulmonary:     Effort: Pulmonary effort is normal. No respiratory distress.     Breath sounds: Normal breath sounds. No wheezing or rales.  Chest:     Chest wall: No tenderness.  Abdominal:     General: Bowel sounds are normal. There is no distension.     Palpations: Abdomen is soft. There is no mass.     Tenderness: There is  no abdominal tenderness. There is no guarding or rebound.  Musculoskeletal: Normal range of motion.        General: No tenderness.  Lymphadenopathy:     Cervical: No cervical adenopathy.  Skin:    General: Skin is warm and dry.     Findings: No rash.  Neurological:     Mental Status: He is alert and oriented to person, place, and time.     Cranial Nerves: No cranial nerve deficit.     Motor: No abnormal muscle tone.     Coordination: Coordination normal.     Gait: Gait normal.     Deep Tendon Reflexes: Reflexes are normal and symmetric.  Psychiatric:        Behavior: Behavior normal.        Thought Content: Thought content normal.        Judgment: Judgment normal.    Pt declined rectal exam  Lab Results  Component Value Date   WBC 7.1 01/08/2018   HGB 16.1 01/08/2018   HCT 47.5 01/08/2018   PLT 245.0 01/08/2018   GLUCOSE 108 (H) 01/08/2018   CHOL 177 01/08/2018   TRIG 130.0 01/08/2018   HDL 83.80 01/08/2018   LDLCALC 67 01/08/2018   ALT 19 01/08/2018   AST 26 01/08/2018   NA 142 01/08/2018   K 3.3 (L) 01/08/2018   CL 102 01/08/2018   CREATININE 0.81 01/08/2018   BUN 6 01/08/2018   CO2 32 01/08/2018   TSH 1.75 01/08/2018   PSA 0.70 01/08/2018    Dg Chest 2 View  Result Date: 01/31/2017 CLINICAL DATA:  Cough and dyspnea for 5 days.  Smoker. EXAM: CHEST  2 VIEW COMPARISON:  05/23/2011 FINDINGS: The heart size and mediastinal contours are within normal limits. Both lungs are clear. Coarsened interstitial markings of emphysema identified. The visualized skeletal structures are unremarkable. IMPRESSION: 1. No acute cardiopulmonary abnormalities. 2. Interstitial coarsening of emphysema Electronically Signed   By: Kerby Moors M.D.   On: 01/31/2017 10:20    Assessment & Plan:   There are no diagnoses linked to this encounter.   No orders of the defined types were placed in this encounter.    Follow-up: No follow-ups on file.  Walker Kehr, MD

## 2019-01-17 ENCOUNTER — Other Ambulatory Visit: Payer: Self-pay | Admitting: Internal Medicine

## 2019-01-17 MED ORDER — POTASSIUM CHLORIDE ER 10 MEQ PO TBCR: 10.0000 meq | EXTENDED_RELEASE_TABLET | Freq: Two times a day (BID) | ORAL | 3 refills | Status: DC

## 2019-01-19 ENCOUNTER — Other Ambulatory Visit: Payer: Self-pay | Admitting: Internal Medicine

## 2019-01-19 DIAGNOSIS — J431 Panlobular emphysema: Secondary | ICD-10-CM

## 2019-01-19 DIAGNOSIS — J449 Chronic obstructive pulmonary disease, unspecified: Secondary | ICD-10-CM

## 2019-01-20 ENCOUNTER — Encounter: Payer: Self-pay | Admitting: Gastroenterology

## 2019-02-03 ENCOUNTER — Ambulatory Visit (INDEPENDENT_AMBULATORY_CARE_PROVIDER_SITE_OTHER)
Admission: RE | Admit: 2019-02-03 | Discharge: 2019-02-03 | Disposition: A | Discharge status: Home / Self Care | Payer: Self-pay | Source: Ambulatory Visit | Attending: Internal Medicine | Admitting: Internal Medicine

## 2019-02-03 ENCOUNTER — Other Ambulatory Visit: Payer: Self-pay

## 2019-02-03 ENCOUNTER — Other Ambulatory Visit: Payer: Self-pay | Admitting: Internal Medicine

## 2019-02-03 DIAGNOSIS — E785 Hyperlipidemia, unspecified: Secondary | ICD-10-CM

## 2019-02-03 DIAGNOSIS — F17200 Nicotine dependence, unspecified, uncomplicated: Secondary | ICD-10-CM

## 2019-02-03 DIAGNOSIS — Z0000 Encounter for general adult medical examination without abnormal findings: Secondary | ICD-10-CM

## 2019-02-04 ENCOUNTER — Encounter: Payer: Self-pay | Admitting: Gastroenterology

## 2019-02-27 ENCOUNTER — Ambulatory Visit (AMBULATORY_SURGERY_CENTER): Payer: Federal, State, Local not specified - PPO | Admitting: *Deleted

## 2019-02-27 ENCOUNTER — Other Ambulatory Visit: Payer: Self-pay

## 2019-02-27 VITALS — Temp 96.6°F | Ht 73.0 in | Wt 147.0 lb

## 2019-02-27 DIAGNOSIS — Z1159 Encounter for screening for other viral diseases: Secondary | ICD-10-CM

## 2019-02-27 DIAGNOSIS — Z86010 Personal history of colonic polyps: Secondary | ICD-10-CM

## 2019-02-27 MED ORDER — SUPREP BOWEL PREP KIT 17.5-3.13-1.6 GM/177ML PO SOLN: 1.0000 | Freq: Once | ORAL | 0 refills | Status: AC

## 2019-02-27 NOTE — Progress Notes (Signed)
No egg or soy allergy known to patient  No issues with past sedation with any surgeries  or procedures, no intubation problems  No diet pills per patient No home 02 use per patient  No blood thinners per patient  Pt denies issues with constipation  No A fib or A flutter  EMMI video sent to pt's e mail  Sup $15 coupon   Due to the COVID-19 pandemic we are asking patients to follow these guidelines. Please only bring one care partner. Please be aware that your care partner may wait in the car in the parking lot or if they feel like they will be too hot to wait in the car, they may wait in the lobby on the 4th floor. All care partners are required to wear a mask the entire time (we do not have any that we can provide them), they need to practice social distancing, and we will do a Covid check for all patient's and care partners when you arrive. Also we will check their temperature and your temperature. If the care partner waits in their car they need to stay in the parking lot the entire time and we will call them on their cell phone when the patient is ready for discharge so they can bring the car to the front of the building. Also all patient's will need to wear a mask into building.

## 2019-03-10 ENCOUNTER — Ambulatory Visit (INDEPENDENT_AMBULATORY_CARE_PROVIDER_SITE_OTHER): Payer: Federal, State, Local not specified - PPO

## 2019-03-10 ENCOUNTER — Other Ambulatory Visit: Payer: Self-pay | Admitting: Gastroenterology

## 2019-03-10 DIAGNOSIS — Z1159 Encounter for screening for other viral diseases: Secondary | ICD-10-CM

## 2019-03-11 LAB — SARS CORONAVIRUS 2 (TAT 6-24 HRS): SARS Coronavirus 2: NEGATIVE

## 2019-03-13 ENCOUNTER — Encounter: Payer: Self-pay | Admitting: Gastroenterology

## 2019-03-13 ENCOUNTER — Other Ambulatory Visit: Payer: Self-pay

## 2019-03-13 ENCOUNTER — Ambulatory Visit (AMBULATORY_SURGERY_CENTER): Payer: Federal, State, Local not specified - PPO | Admitting: Gastroenterology

## 2019-03-13 VITALS — BP 119/81 | HR 82 | Temp 98.2°F | Resp 12 | Ht 73.0 in | Wt 147.0 lb

## 2019-03-13 DIAGNOSIS — Z86010 Personal history of colonic polyps: Secondary | ICD-10-CM

## 2019-03-13 MED ORDER — SODIUM CHLORIDE 0.9 % IV SOLN: 500.0000 mL | Freq: Once | INTRAVENOUS | Status: DC

## 2019-03-13 NOTE — Progress Notes (Signed)
Pt's states no medical or surgical changes since previsit or office visit.  LC -temp CW - vitals 

## 2019-03-13 NOTE — Op Note (Signed)
[facility] Patient Name: Travis Hicks Procedure Date: 03/13/2019 9:27 AM MRN: [id number] Endoscopist: Ladene Artist , MD Age: 69 Referring MD:  Date of Birth: 12-16-1950 Gender: Male Account #: [id number] Procedure:                Colonoscopy Indications:              Surveillance: Personal history of adenomatous                            polyps on last colonoscopy 5 years ago Medicines:                Monitored Anesthesia Care Procedure:                Pre-Anesthesia Assessment:                           - Prior to the procedure, a History and Physical                            was performed, and patient medications and                            allergies were reviewed. The patient's tolerance of                            previous anesthesia was also reviewed. The risks                            and benefits of the procedure and the sedation                            options and risks were discussed with the patient.                            All questions were answered, and informed consent                            was obtained. Prior Anticoagulants: The patient has                            taken no previous anticoagulant or antiplatelet                            agents. ASA Grade Assessment: II - A patient with                            mild systemic disease. After reviewing the risks                            and benefits, the patient was deemed in                            satisfactory condition to undergo the procedure.  After obtaining informed consent, the colonoscope                            was passed under direct vision. Throughout the                            procedure, the patient's blood pressure, pulse, and                            oxygen saturations were monitored continuously. The                            Colonoscope was introduced through the anus and                            advanced to the the cecum,  identified by                            appendiceal orifice and ileocecal valve. The                            ileocecal valve, appendiceal orifice, and rectum                            were photographed. The quality of the bowel                            preparation was good. The colonoscopy was performed                            without difficulty. The patient tolerated the                            procedure fairly well with persistent coughing. The                            colonoscopy was somewhat difficult due to                            persistent coughing. Scope In: 9:32:27 AM Scope Out: 9:49:57 AM Scope Withdrawal Time: 0 hours 13 minutes 28 seconds  Total Procedure Duration: 0 hours 17 minutes 30 seconds  Findings:                 The perianal and digital rectal examinations were                            normal.                           Multiple small localized angiodysplastic lesions                            without bleeding were found in the cecum.  Scattered small-mouthed diverticula were found in                            the entire colon. There was no evidence of                            diverticular bleeding.                           Internal hemorrhoids and hypertrophied anal                            papillae were found during retroflexion. The                            hemorrhoids were moderate and Grade I (internal                            hemorrhoids that do not prolapse).                           The exam was otherwise without abnormality on                            direct and retroflexion views. Complications:            No immediate complications. Estimated blood loss:                            None. Estimated Blood Loss:     Estimated blood loss: none. Impression:               - Multiple non-bleeding colonic angiodysplastic                            lesions.                           - Mild diverticulosis in  the entire examined colon.                           - Internal hemorrhoids and hypertrophied anal                            papillae.                           - No specimens collected. Recommendation:           - Repeat colonoscopy in 7 years for surveillance.                           - Patient has a contact number available for                            emergencies. The signs and symptoms of potential  delayed complications were discussed with the                            patient. Return to normal activities tomorrow.                            Written discharge instructions were provided to the                            patient.                           - High fiber diet.                           - Continue present medications. Ladene Artist, MD 03/13/2019 9:55:23 AM This report has been signed electronically.

## 2019-03-13 NOTE — Patient Instructions (Signed)
Handouts given for hemorrhoids, diverticulosis and high fiber diet.  YOU HAD AN ENDOSCOPIC PROCEDURE TODAY AT [facility]:   Refer to the procedure report that was given to you for any specific questions about what was found during the examination.  If the procedure report does not answer your questions, please call your gastroenterologist to clarify.  If you requested that your care partner not be given the details of your procedure findings, then the procedure report has been included in a sealed envelope for you to review at your convenience later.  YOU SHOULD EXPECT: Some feelings of bloating in the abdomen. Passage of more gas than usual.  Walking can help get rid of the air that was put into your GI tract during the procedure and reduce the bloating. If you had a lower endoscopy (such as a colonoscopy or flexible sigmoidoscopy) you may notice spotting of blood in your stool or on the toilet paper. If you underwent a bowel prep for your procedure, you may not have a normal bowel movement for a few days.  Please Note:  You might notice some irritation and congestion in your nose or some drainage.  This is from the oxygen used during your procedure.  There is no need for concern and it should clear up in a day or so.  SYMPTOMS TO REPORT IMMEDIATELY:   Following lower endoscopy (colonoscopy or flexible sigmoidoscopy):  Excessive amounts of blood in the stool  Significant tenderness or worsening of abdominal pains  Swelling of the abdomen that is new, acute  Fever of 100F or higher  For urgent or emergent issues, a gastroenterologist can be reached at any hour by calling [phone].   DIET:  We do recommend a small meal at first, but then you may proceed to your regular diet.  Drink plenty of fluids but you should avoid alcoholic beverages for 24 hours.  ACTIVITY:  You should plan to take it easy for the rest of today and you should NOT DRIVE or use heavy machinery until  tomorrow (because of the sedation medicines used during the test).    FOLLOW UP: Our staff will call the number listed on your records 48-72 hours following your procedure to check on you and address any questions or concerns that you may have regarding the information given to you following your procedure. If we do not reach you, we will leave a message.  We will attempt to reach you two times.  During this call, we will ask if you have developed any symptoms of COVID 19. If you develop any symptoms (ie: fever, flu-like symptoms, shortness of breath, cough etc.) before then, please call [phone].  If you test positive for Covid 19 in the 2 weeks post procedure, please call and report this information to [place].    If any biopsies were taken you will be contacted by phone or by letter within the next 1-3 weeks.  Please call us at [phone] if you have not heard about the biopsies in 3 weeks.    SIGNATURES/CONFIDENTIALITY: You and/or your care partner have signed paperwork which will be entered into your electronic medical record.  These signatures attest to the fact that that the information above on your After Visit Summary has been reviewed and is understood.  Full responsibility of the confidentiality of this discharge information lies with you and/or your care-partner.

## 2019-03-13 NOTE — Progress Notes (Signed)
A and O x3. Report to RN. Tolerated MAC anesthesia well.

## 2019-03-17 ENCOUNTER — Telehealth: Payer: Self-pay | Admitting: *Deleted

## 2019-03-17 ENCOUNTER — Telehealth: Payer: Self-pay

## 2019-03-17 NOTE — Telephone Encounter (Signed)
  Follow up Call-  Call back number 03/13/2019  Post procedure Call Back phone  # [phone]  Permission to leave phone message Yes  Some recent data might be hidden     Patient questions:  Do you have a fever, pain , or abdominal swelling? No. Pain Score  0 *  Have you tolerated food without any problems? Yes.    Have you been able to return to your normal activities? Yes.    Do you have any questions about your discharge instructions: Diet   No. Medications  No. Follow up visit  No.  Do you have questions or concerns about your Care? No.  Actions: * If pain score is 4 or above: No action needed, pain <4.  1. Have you developed a fever since your procedure? no  2.   Have you had an respiratory symptoms (SOB or cough) since your procedure? no  3.   Have you tested positive for COVID 19 since your procedure no  4.   Have you had any family members/close contacts diagnosed with the COVID 19 since your procedure?  no   If yes to any of these questions please route to Joylene John, RN and Alphonsa Gin, Therapist, sports.

## 2019-03-17 NOTE — Telephone Encounter (Signed)
1st follow up call made.  NALM 

## 2019-07-04 ENCOUNTER — Telehealth: Payer: Self-pay | Admitting: Internal Medicine

## 2019-07-04 DIAGNOSIS — R1310 Dysphagia, unspecified: Secondary | ICD-10-CM

## 2019-07-04 NOTE — Telephone Encounter (Signed)
I do not see anything in last OV note, please advise

## 2019-07-04 NOTE — Telephone Encounter (Signed)
New message:   Pt is calling and states his swallowing has not gotten any better since his last visit and would like to be referred out to a specialist. Please advise.

## 2019-07-05 NOTE — Telephone Encounter (Signed)
Okay.  I will refer to GI.  Thanks

## 2019-08-19 ENCOUNTER — Encounter: Payer: Self-pay | Admitting: Gastroenterology

## 2019-08-19 ENCOUNTER — Ambulatory Visit: Payer: Federal, State, Local not specified - PPO | Admitting: Gastroenterology

## 2019-08-19 VITALS — BP 130/72 | HR 98 | Ht 72.0 in | Wt 138.0 lb

## 2019-08-19 DIAGNOSIS — R1310 Dysphagia, unspecified: Secondary | ICD-10-CM | POA: Diagnosis not present

## 2019-08-19 DIAGNOSIS — R634 Abnormal weight loss: Secondary | ICD-10-CM

## 2019-08-19 NOTE — Progress Notes (Signed)
    History of Present Illness: This is a 69 year old male complaining of solid food dysphagia.  Symptoms began after he had 12 upper teeth pulled and an upper plate in June 5320. He states he has had multiple upper plate adjustment since and he relates difficulty swallowing bread and meat since. He relates about a 30 lb weight loss over the past year. He has a history of GERD with erosive esophagitis diagnosed at EGD in 1997. Currently no reflux symptoms. Denies abdominal pain, constipation, diarrhea, change in stool caliber, melena, hematochezia, nausea, vomiting, reflux symptoms, chest pain.  Current Medications, Allergies, Past Medical History, Past Surgical History, Family History and Social History were reviewed in [employer] record.   Physical Exam: General: Well developed, well nourished, no acute distress Head: Normocephalic and atraumatic Eyes:  sclerae anicteric, EOMI Ears: Normal auditory acuity Mouth: Not examined, mask on during Covid-19 pandemic Lungs: Clear throughout to auscultation Heart: Regular rate and rhythm; no murmurs, rubs or bruits Abdomen: Soft, non tender and non distended. No masses, hepatosplenomegaly or hernias noted. Normal Bowel sounds Rectal: Not done  Musculoskeletal: Symmetrical with no gross deformities  Pulses:  Normal pulses noted Extremities: No clubbing, cyanosis, edema or deformities noted Neurological: Alert oriented x 4, grossly nonfocal Psychological:  Alert and cooperative. Normal mood and affect   Assessment and Recommendations:  1 . Dysphagia. Weight loss. Symptoms began when upper dentures were placed. R/O oropharyngeal, esophageal disorder. Schedule MBSS, BA esophagram and EGD. The risks (including bleeding, perforation, infection, missed lesions, medication reactions and possible hospitalization or surgery if complications occur), benefits, and alternatives to endoscopy with possible biopsy and possible dilation were  discussed with the patient and they consent to proceed.   2. Colonic AVMs.  3. Personal history of adenomatous colon polyps. Surveillance colonoscopy in Jan 2028.

## 2019-08-19 NOTE — Patient Instructions (Signed)
You have been scheduled for a Barium Esophogram at [facility] (1st floor of the hospital) on _________________ at ___________________. Please arrive 15 minutes prior to your appointment for registration. Make certain not to have anything to eat or drink 3 hours prior to your test. If you need to reschedule for any reason, please contact radiology at [phone] to do so. __________________________________________________________________ A barium swallow is an examination that concentrates on views of the esophagus. This tends to be a double contrast exam (barium and two liquids which, when combined, create a gas to distend the wall of the oesophagus) or single contrast (non-ionic iodine based). The study is usually tailored to your symptoms so a good history is essential. Attention is paid during the study to the form, structure and configuration of the esophagus, looking for functional disorders (such as aspiration, dysphagia, achalasia, motility and reflux) EXAMINATION You may be asked to change into a gown, depending on the type of swallow being performed. A radiologist and radiographer will perform the procedure. The radiologist will advise you of the type of contrast selected for your procedure and direct you during the exam. You will be asked to stand, sit or lie in several different positions and to hold a small amount of fluid in your mouth before being asked to swallow while the imaging is performed .In some instances you may be asked to swallow barium coated marshmallows to assess the motility of a solid food bolus. The exam can be recorded as a digital or video fluoroscopy procedure. POST PROCEDURE It will take 1-2 days for the barium to pass through your system. To facilitate this, it is important, unless otherwise directed, to increase your fluids for the next 24-48hrs and to resume your normal diet.  This test typically takes about 30 minutes to  perform. ____________________________________________________________   Travis Hicks have been scheduled for an endoscopy. Please follow written instructions given to you at your visit today. If you use inhalers (even only as needed), please bring them with you on the day of your procedure.

## 2019-08-22 ENCOUNTER — Other Ambulatory Visit (HOSPITAL_COMMUNITY): Payer: Self-pay

## 2019-08-22 DIAGNOSIS — R1310 Dysphagia, unspecified: Secondary | ICD-10-CM

## 2019-08-27 ENCOUNTER — Ambulatory Visit (HOSPITAL_COMMUNITY)
Admission: RE | Admit: 2019-08-27 | Discharge: 2019-08-27 | Disposition: A | Discharge status: Home / Self Care | Payer: Federal, State, Local not specified - PPO | Source: Ambulatory Visit | Attending: Gastroenterology | Admitting: Gastroenterology

## 2019-08-27 ENCOUNTER — Other Ambulatory Visit: Payer: Self-pay

## 2019-08-27 DIAGNOSIS — R1310 Dysphagia, unspecified: Secondary | ICD-10-CM

## 2019-08-27 DIAGNOSIS — R634 Abnormal weight loss: Secondary | ICD-10-CM | POA: Diagnosis present

## 2019-08-28 ENCOUNTER — Telehealth: Payer: Self-pay

## 2019-08-28 NOTE — Telephone Encounter (Signed)
Left message for patient to please call back. 

## 2019-08-29 ENCOUNTER — Telehealth: Payer: Self-pay

## 2019-08-29 NOTE — Telephone Encounter (Signed)
Left message again today to please call back. Trying to give results and recommendations from his swallow studies.

## 2019-09-10 ENCOUNTER — Encounter: Payer: Self-pay | Admitting: Gastroenterology

## 2019-09-19 ENCOUNTER — Telehealth: Payer: Self-pay | Admitting: Gastroenterology

## 2019-09-19 NOTE — Telephone Encounter (Signed)
Hey Dr Fuller Plan, this pt cancelled his upcoming EGD with you 8/2 due to Pt cancelled procedure stating he feels his dentures were the problem of his difficulty swallowing, pt does not wish to reschedule at this time.

## 2019-09-22 ENCOUNTER — Encounter: Payer: Federal, State, Local not specified - PPO | Admitting: Gastroenterology

## 2019-12-03 ENCOUNTER — Encounter: Payer: Self-pay | Admitting: Internal Medicine

## 2020-02-17 ENCOUNTER — Other Ambulatory Visit: Payer: Self-pay | Admitting: Internal Medicine

## 2020-02-17 DIAGNOSIS — J431 Panlobular emphysema: Secondary | ICD-10-CM

## 2020-02-17 DIAGNOSIS — J449 Chronic obstructive pulmonary disease, unspecified: Secondary | ICD-10-CM

## 2020-02-25 ENCOUNTER — Encounter: Payer: Federal, State, Local not specified - PPO | Admitting: Internal Medicine

## 2020-03-04 ENCOUNTER — Other Ambulatory Visit: Payer: Self-pay

## 2020-03-05 ENCOUNTER — Ambulatory Visit (INDEPENDENT_AMBULATORY_CARE_PROVIDER_SITE_OTHER): Payer: Federal, State, Local not specified - PPO | Admitting: Internal Medicine

## 2020-03-05 ENCOUNTER — Encounter: Payer: Self-pay | Admitting: Internal Medicine

## 2020-03-05 VITALS — BP 138/60 | HR 69 | Temp 98.5°F | Ht 72.0 in | Wt 126.6 lb

## 2020-03-05 DIAGNOSIS — E43 Unspecified severe protein-calorie malnutrition: Secondary | ICD-10-CM

## 2020-03-05 DIAGNOSIS — R413 Other amnesia: Secondary | ICD-10-CM

## 2020-03-05 DIAGNOSIS — Z0000 Encounter for general adult medical examination without abnormal findings: Secondary | ICD-10-CM

## 2020-03-05 DIAGNOSIS — Z972 Presence of dental prosthetic device (complete) (partial): Secondary | ICD-10-CM

## 2020-03-05 DIAGNOSIS — K0889 Other specified disorders of teeth and supporting structures: Secondary | ICD-10-CM

## 2020-03-05 DIAGNOSIS — R634 Abnormal weight loss: Secondary | ICD-10-CM

## 2020-03-05 DIAGNOSIS — E559 Vitamin D deficiency, unspecified: Secondary | ICD-10-CM | POA: Diagnosis not present

## 2020-03-05 DIAGNOSIS — E46 Unspecified protein-calorie malnutrition: Secondary | ICD-10-CM | POA: Insufficient documentation

## 2020-03-05 MED ORDER — MEGESTROL ACETATE 400 MG/10ML PO SUSP: 400.0000 mg | Freq: Two times a day (BID) | ORAL | 5 refills | Status: AC

## 2020-03-05 NOTE — Progress Notes (Addendum)
Subjective:  Patient ID: Travis Hicks, male    DOB: [birth year]  Age: 70 y.o. MRN: [id number]  CC:   HPI Travis Hicks presents for wt loss  In the recent past he was complaining of solid food dysphagia.  Symptoms began after he had 12 upper teeth pulled and an upper plate in June 8242. He states he has had multiple upper plate adjustment since and he relates difficulty swallowing bread and meat since. He relates about a 30 lb weight loss over the past year. He has a history of GERD with erosive esophagitis diagnosed at EGD in 1997. Currently no reflux symptoms. Denies abdominal pain, constipation, diarrhea, change in stool caliber, melena, hematochezia, nausea, vomiting, reflux symptoms, chest pain.  Pt had colonoscopy, EGD was cancelled because he can swallow ok w/o he is dentures  No appetite There is almost no sense of taste or smell He eats only because he has to, mostly liquid or pured food.  Outpatient Medications Prior to Visit  Medication Sig Dispense Refill  . ANORO ELLIPTA 62.5-25 MCG/INH AEPB INHALE 1 PUFF BY MOUTH EVERY DAY 60 each 11  . b complex vitamins tablet Take 1 tablet by mouth daily. 100 tablet 3  . Cholecalciferol (VITAMIN D3) 50 MCG (2000 UT) capsule Take 1 capsule (2,000 Units total) by mouth daily. 100 capsule 3  . latanoprost (XALATAN) 0.005 % ophthalmic solution     . potassium chloride (KLOR-CON 10) 10 MEQ tablet Take 1 tablet (10 mEq total) by mouth 2 (two) times daily. (Patient not taking: Reported on 03/05/2020) 180 tablet 3   No facility-administered medications prior to visit.    ROS: Review of Systems  Constitutional: Negative for appetite change, fatigue and unexpected weight change.  HENT: Negative for congestion, nosebleeds, sneezing, sore throat and trouble swallowing.   Eyes: Negative for itching and visual disturbance.  Respiratory: Negative for cough.   Cardiovascular: Negative for chest pain, palpitations and leg swelling.   Gastrointestinal: Negative for abdominal distention, blood in stool, diarrhea and nausea.  Genitourinary: Negative for frequency and hematuria.  Musculoskeletal: Negative for back pain, gait problem, joint swelling and neck pain.  Skin: Negative for rash.  Neurological: Negative for dizziness, tremors, speech difficulty and weakness.  Psychiatric/Behavioral: Negative for agitation, dysphoric mood and sleep disturbance. The patient is not nervous/anxious.     Objective:  BP 138/60 (BP Location: Right Arm)   Pulse 69   Temp 98.5 F (36.9 C) (Oral)   Ht 6' (1.829 m)   Wt 126 lb 9.6 oz (57.4 kg)   SpO2 96%   BMI 17.17 kg/m   BP Readings from Last 3 Encounters:  03/05/20 138/60  08/19/19 130/72  03/13/19 119/81    Wt Readings from Last 3 Encounters:  03/05/20 126 lb 9.6 oz (57.4 kg)  08/19/19 138 lb (62.6 kg)  03/13/19 147 lb (66.7 kg)    Physical Exam Constitutional:      General: He is not in acute distress.    Appearance: He is well-developed.     Comments: NAD  HENT:     Mouth/Throat:     Mouth: Oropharynx is clear and moist.  Eyes:     Conjunctiva/sclera: Conjunctivae normal.     Pupils: Pupils are equal, round, and reactive to light.  Neck:     Thyroid: No thyromegaly.     Vascular: No JVD.  Cardiovascular:     Rate and Rhythm: Normal rate and regular rhythm.     Pulses: Intact distal pulses.  Heart sounds: Normal heart sounds. No murmur heard. No friction rub. No gallop.   Pulmonary:     Effort: Pulmonary effort is normal. No respiratory distress.     Breath sounds: Normal breath sounds. No wheezing or rales.  Chest:     Chest wall: No tenderness.  Abdominal:     General: Bowel sounds are normal. There is no distension.     Palpations: Abdomen is soft. There is no mass.     Tenderness: There is no abdominal tenderness. There is no guarding or rebound.  Musculoskeletal:        General: No tenderness or edema. Normal range of motion.     Cervical  back: Normal range of motion.  Lymphadenopathy:     Cervical: No cervical adenopathy.  Skin:    General: Skin is warm and dry.     Findings: No rash.  Neurological:     Mental Status: He is alert and oriented to person, place, and time.     Cranial Nerves: No cranial nerve deficit.     Motor: No abnormal muscle tone.     Coordination: He displays a negative Romberg sign. Coordination normal.     Gait: Gait normal.     Deep Tendon Reflexes: Reflexes are normal and symmetric.  Psychiatric:        Mood and Affect: Mood and affect normal.        Behavior: Behavior normal.        Thought Content: Thought content normal.        Judgment: Judgment normal.   The patient is very thin Absent upper jaw teeth, no dentures in place Scattered teeth on the lower jaw     A total time of >45 minutes was spent preparing to see the patient, reviewing tests GI records.  Also, obtaining history and performing comprehensive physical exam.  Additionally, counseling the patient regarding the above listed issues, weight loss and malnutrition.   Finally, documenting clinical information in the health records, discussing diet/care, educating the patient on possible options to stimulate his appetite. It is a complex case.  Lab Results  Component Value Date   WBC 5.9 01/14/2019   HGB 14.6 01/14/2019   HCT 43.0 01/14/2019   PLT 249.0 01/14/2019   GLUCOSE 109 (H) 01/14/2019   CHOL 196 01/14/2019   TRIG 71.0 01/14/2019   HDL 121.70 01/14/2019   LDLCALC 60 01/14/2019   ALT 14 01/14/2019   AST 22 01/14/2019   NA 140 01/14/2019   K 2.5 (LL) 01/14/2019   CL 96 01/14/2019   CREATININE 0.72 01/14/2019   BUN 6 01/14/2019   CO2 35 (H) 01/14/2019   TSH 1.63 01/14/2019   PSA 0.87 01/14/2019    DG SWALLOW FUNC SPEECH PATH  Result Date: 08/27/2019 Objective Swallowing Evaluation: Type of Study: MBS-Modified Barium Swallow Study  Patient Details Name: Travis Hicks MRN: [id number] Date of Birth: [birth year]  Today's Date: 08/27/2019 Time: SLP Start Time (ACUTE ONLY): 1425 -SLP Stop Time (ACUTE ONLY): 1500 SLP Time Calculation (min) (ACUTE ONLY): 35 min Past Medical History: Past Medical History: Diagnosis Date . Cataract   veru small per pt . Marland Kitchen Emphysema of lung ([place])  . Glaucoma 2018 . Overactive bladder  Past Surgical History: Past Surgical History: Procedure Laterality Date . COLONOSCOPY    10-10-02,11-21-2005 . DENTAL SURGERY    had 14 teeth pulled  . HAND SURGERY Right  . POLYPECTOMY    10-10-02,11-21-05 HPI: 70 year old male referred for OP MBS by Dr.  Fuller Plan, GI.  He has been complaining of solid food dysphagia.  Symptoms began after he had 12 upper teeth pulled and an upper plate in June 075-GRM. He states he has had multiple upper plate adjustments since and he relates difficulty swallowing bread and meat, with foods "gumming up" in the back of his mouth, multiple attempts at swallowing, followed by gagging.  He relates about a 30 lb weight loss over the past year. He has a history of GERD with erosive esophagitis diagnosed at EGD in 1997. He had an esophagram today, 08/27/19, which was normal with no penetration/aspiration and normal motility.  Subjective: cooperative, good historian Assessment / Plan / Recommendation CHL IP CLINICAL IMPRESSIONS 08/27/2019 Clinical Impression Pt participated in [facility].  Oral mechanism exam was normal with healthy mucosa, presence of bottom teeth, upper dentures. CN function normal.  There was functional mastication of soft and hard solids with adequate bolus control and propulsion.  All materials cleared through the pharynx easily, leaving no residue. There was reliable laryngeal vestibule closure with all consistencies, and no penetration/aspiration.  Pt exhibited the gagging behavior he had described when eating a breakfast bar - he began to gag and then expectorated all material.  It was suggested that he remove his upper dentures, after which he consumed a second breakfast bar - no gagging  was observed.  Pt states he has not tried eating without his teeth.  There is no indication of an oropharyngeal dysphagia, however, there appears to be stimulation of his gag while eating, and it could potentially be related to length of prosthetic palate on the dentures or positioning while chewing.  Suggested to patient that he eat several meals without his dentures and keep track of incidents of gagging with and without teeth. Provided him with office number and asked him to call next week if gagging persists despite absence of dentures.  If the dentures do not make a difference, he may benefit from an OP SLP consult - there may be some therapies traditionally used with children that address texture aversion or hypersensitive gag that could be modified to these circumstances. This is an unusual situation, and hopefully readily explained by his dentures, but if not, it is worth exploring other creative solutions. SLP Visit Diagnosis Dysphagia, unspecified (R13.10) Attention and concentration deficit following -- Frontal lobe and executive function deficit following -- Impact on safety and function No limitations   CHL IP TREATMENT RECOMMENDATION 08/27/2019 Treatment Recommendations Other (Comment)   No flowsheet data found. CHL IP DIET RECOMMENDATION 08/27/2019 SLP Diet Recommendations Regular solids;Thin liquid Liquid Administration via Cup;Straw Medication Administration Whole meds with liquid Compensations -- Postural Changes --   CHL IP OTHER RECOMMENDATIONS 08/27/2019 Recommended Consults -- Oral Care Recommendations Oral care BID Other Recommendations --   No flowsheet data found.  No flowsheet data found.     CHL IP ORAL PHASE 08/27/2019 Oral Phase WFL Oral - Pudding Teaspoon -- Oral - Pudding Cup -- Oral - Honey Teaspoon -- Oral - Honey Cup -- Oral - Nectar Teaspoon -- Oral - Nectar Cup -- Oral - Nectar Straw -- Oral - Thin Teaspoon -- Oral - Thin Cup -- Oral - Thin Straw -- Oral - Puree -- Oral - Mech Soft --  Oral - Regular -- Oral - Multi-Consistency -- Oral - Pill -- Oral Phase - Comment --  CHL IP PHARYNGEAL PHASE 08/27/2019 Pharyngeal Phase WFL Pharyngeal- Pudding Teaspoon -- Pharyngeal -- Pharyngeal- Pudding Cup -- Pharyngeal -- Pharyngeal- Honey Teaspoon -- Pharyngeal --  Pharyngeal- Honey Cup -- Pharyngeal -- Pharyngeal- Nectar Teaspoon -- Pharyngeal -- Pharyngeal- Nectar Cup -- Pharyngeal -- Pharyngeal- Nectar Straw -- Pharyngeal -- Pharyngeal- Thin Teaspoon -- Pharyngeal -- Pharyngeal- Thin Cup -- Pharyngeal -- Pharyngeal- Thin Straw -- Pharyngeal -- Pharyngeal- Puree -- Pharyngeal -- Pharyngeal- Mechanical Soft -- Pharyngeal -- Pharyngeal- Regular -- Pharyngeal -- Pharyngeal- Multi-consistency -- Pharyngeal -- Pharyngeal- Pill -- Pharyngeal -- Pharyngeal Comment --  CHL IP CERVICAL ESOPHAGEAL PHASE 08/27/2019 Cervical Esophageal Phase (No Data) Pudding Teaspoon -- Pudding Cup -- Honey Teaspoon -- Honey Cup -- Nectar Teaspoon -- Nectar Cup -- Nectar Straw -- Thin Teaspoon -- Thin Cup -- Thin Straw -- Puree -- Mechanical Soft -- Regular -- Multi-consistency -- Pill -- Cervical Esophageal Comment -- Juan Quam Laurice 08/27/2019, 4:24 PM              DG ESOPHAGUS W DOUBLE CM (HD)  Result Date: 08/27/2019 CLINICAL DATA:  Solid food dysphagia. Erosive esophagitis. Gastroesophageal reflux disease. 30 lb weight loss. EXAM: ESOPHOGRAM / BARIUM SWALLOW / BARIUM TABLET STUDY TECHNIQUE: Combined double contrast and single contrast examination performed using effervescent crystals, thick barium liquid, and thin barium liquid. The patient was observed with fluoroscopy swallowing a 13 mm barium sulphate tablet. FLUOROSCOPY TIME:  Radiation Exposure Index (if provided by the fluoroscopic device): 8.5 mGy Number of Acquired Spot Images: 0 COMPARISON:  None. FINDINGS: No vestibular penetration or aspiration was seen during swallowing. No evidence of esophageal mass or stricture. No findings of esophagitis noted. Esophageal  motility is within normal limits. No evidence of hiatal hernia or gastroesophgeal reflux. An ingested 38mm barium tablet passed freely through the esophagus, and into the stomach. IMPRESSION: Negative esophagram. Electronically Signed   By: Marlaine Hind M.D.   On: 08/27/2019 14:58    Assessment & Plan:    Walker Kehr, MD

## 2020-03-05 NOTE — Assessment & Plan Note (Signed)
Vit D level Taking Vit D

## 2020-03-05 NOTE — Assessment & Plan Note (Addendum)
It is a challenge.  The patient has severe malnutrition.  Obtain c-Met, vitamin B-12, vitamin D, zinc, thiamine, liver tests.  He has no sense of taste or smell. We can try Megace, Marinol.  Megace prescribed risk/benefits discussed.

## 2020-03-05 NOTE — Assessment & Plan Note (Addendum)
New -worsening symptoms over the past several months.  Rule out nutrient/vitamin deficiencies.  Obtain vitamin B12, vitamin D levels.  Obtain zinc, thiamine levels

## 2020-03-05 NOTE — Assessment & Plan Note (Addendum)
Wt Readings from Last 3 Encounters:  03/05/20 126 lb 9.6 oz (57.4 kg)  08/19/19 138 lb (62.6 kg)  03/13/19 147 lb (66.7 kg)  down from 168 lbs  It is a challenge.  The patient has severe malnutrition.  Obtain c-Met, vitamin B-12, vitamin D, zinc, thiamine, liver tests.  He has no sense of taste or smell. We can try Megace, Marinol.  Megace prescribed - risk/benefits discussed.  Upper plate makes him gag.  Not using his lower partial.

## 2020-03-07 DIAGNOSIS — Z972 Presence of dental prosthetic device (complete) (partial): Secondary | ICD-10-CM | POA: Insufficient documentation

## 2020-03-07 DIAGNOSIS — K0889 Other specified disorders of teeth and supporting structures: Secondary | ICD-10-CM | POA: Insufficient documentation

## 2020-03-07 NOTE — Assessment & Plan Note (Signed)
Upper plate makes him gag.  Not using his lower partial.

## 2020-03-10 LAB — PSA: PSA: 0.37 ng/mL (ref <=4.0)

## 2020-03-10 LAB — LIPID PANEL
Cholesterol: 140 mg/dL (ref <200)
HDL: 73 mg/dL (ref >=40)
LDL Cholesterol (Calc): 43 mg/dL (calc)
Non-HDL Cholesterol (Calc): 67 mg/dL (calc) (ref <130)
Total CHOL/HDL Ratio: 1.9 (calc) (ref <5.0)
Triglycerides: 166 mg/dL — ABNORMAL HIGH (ref <150)

## 2020-03-10 LAB — COMPREHENSIVE METABOLIC PANEL
AG Ratio: 1.4 (calc) (ref 1.0–2.5)
ALT: 76 U/L — ABNORMAL HIGH (ref 9–46)
AST: 131 U/L — ABNORMAL HIGH (ref 10–35)
Albumin: 3.7 g/dL (ref 3.6–5.1)
Alkaline phosphatase (APISO): 145 U/L — ABNORMAL HIGH (ref 35–144)
BUN/Creatinine Ratio: 8 (calc) (ref 6–22)
BUN: 6 mg/dL — ABNORMAL LOW (ref 7–25)
CO2: 24 mmol/L (ref 20–32)
Calcium: 9.1 mg/dL (ref 8.6–10.3)
Chloride: 104 mmol/L (ref 98–110)
Creat: 0.77 mg/dL (ref 0.70–1.25)
Globulin: 2.6 g/dL (calc) (ref 1.9–3.7)
Glucose, Bld: 103 mg/dL — ABNORMAL HIGH (ref 65–99)
Potassium: 4.3 mmol/L (ref 3.5–5.3)
Sodium: 142 mmol/L (ref 135–146)
Total Bilirubin: 2.7 mg/dL — ABNORMAL HIGH (ref 0.2–1.2)
Total Protein: 6.3 g/dL (ref 6.1–8.1)

## 2020-03-10 LAB — CBC WITH DIFFERENTIAL/PLATELET
Absolute Monocytes: 371 cells/uL (ref 200–950)
Basophils Absolute: 20 cells/uL (ref 0–200)
Basophils Relative: 0.3 %
Eosinophils Absolute: 13 cells/uL — ABNORMAL LOW (ref 15–500)
Eosinophils Relative: 0.2 %
HCT: 41.8 % (ref 38.5–50.0)
Hemoglobin: 14.7 g/dL (ref 13.2–17.1)
Lymphs Abs: 949 cells/uL (ref 850–3900)
MCH: 35.7 pg — ABNORMAL HIGH (ref 27.0–33.0)
MCHC: 35.2 g/dL (ref 32.0–36.0)
MCV: 101.5 fL — ABNORMAL HIGH (ref 80.0–100.0)
MPV: 10.1 fL (ref 7.5–12.5)
Monocytes Relative: 5.7 %
Neutro Abs: 5148 cells/uL (ref 1500–7800)
Neutrophils Relative %: 79.2 %
Platelets: 253 10*3/uL (ref 140–400)
RBC: 4.12 10*6/uL — ABNORMAL LOW (ref 4.20–5.80)
RDW: 13.1 % (ref 11.0–15.0)
Total Lymphocyte: 14.6 %
WBC: 6.5 10*3/uL (ref 3.8–10.8)

## 2020-03-10 LAB — URINALYSIS
Bilirubin Urine: NEGATIVE
Glucose, UA: NEGATIVE
Hgb urine dipstick: NEGATIVE
Nitrite: POSITIVE — AB
Specific Gravity, Urine: 1.023 (ref 1.001–1.03)
pH: 6 (ref 5.0–8.0)

## 2020-03-10 LAB — VITAMIN B1: Vitamin B1 (Thiamine): 43 nmol/L — ABNORMAL HIGH (ref 8–30)

## 2020-03-10 LAB — EXTRA URINE SPECIMEN

## 2020-03-10 LAB — T4, FREE: Free T4: 1 ng/dL (ref 0.8–1.8)

## 2020-03-10 LAB — VITAMIN D 25 HYDROXY (VIT D DEFICIENCY, FRACTURES): Vit D, 25-Hydroxy: 30 ng/mL (ref 30–100)

## 2020-03-10 LAB — ZINC: Zinc: 85 ug/dL (ref 60–130)

## 2020-03-10 LAB — HIV ANTIBODY (ROUTINE TESTING W REFLEX): HIV 1&2 Ab, 4th Generation: NONREACTIVE

## 2020-03-10 LAB — VITAMIN B12: Vitamin B-12: 396 pg/mL (ref 200–1100)

## 2020-03-10 LAB — VITAMIN A: Vitamin A (Retinoic Acid): 61 ug/dL (ref 38–98)

## 2020-03-10 LAB — TSH: TSH: 1.34 mIU/L (ref 0.40–4.50)

## 2020-03-10 LAB — TESTOSTERONE: Testosterone: 406 ng/dL (ref 250–827)

## 2020-03-10 LAB — SEDIMENTATION RATE: Sed Rate: 2 mm/h (ref 0–20)

## 2020-03-11 ENCOUNTER — Other Ambulatory Visit: Payer: Self-pay | Admitting: Internal Medicine

## 2020-03-11 DIAGNOSIS — N390 Urinary tract infection, site not specified: Secondary | ICD-10-CM

## 2020-03-11 DIAGNOSIS — R7989 Other specified abnormal findings of blood chemistry: Secondary | ICD-10-CM

## 2020-03-11 MED ORDER — CEFUROXIME AXETIL 250 MG PO TABS: 250.0000 mg | ORAL_TABLET | Freq: Two times a day (BID) | ORAL | 0 refills | Status: DC

## 2020-03-11 MED ORDER — VITAMIN D3 50 MCG (2000 UT) PO CAPS: 4000.0000 [IU] | ORAL_CAPSULE | Freq: Every day | ORAL | 3 refills | Status: AC

## 2020-03-15 ENCOUNTER — Other Ambulatory Visit: Payer: Self-pay | Admitting: Internal Medicine

## 2020-03-15 DIAGNOSIS — N390 Urinary tract infection, site not specified: Secondary | ICD-10-CM

## 2020-03-15 DIAGNOSIS — R7989 Other specified abnormal findings of blood chemistry: Secondary | ICD-10-CM

## 2020-03-25 ENCOUNTER — Other Ambulatory Visit: Payer: Federal, State, Local not specified - PPO

## 2020-03-25 ENCOUNTER — Ambulatory Visit
Admission: RE | Admit: 2020-03-25 | Discharge: 2020-03-25 | Disposition: A | Discharge status: Home / Self Care | Payer: Federal, State, Local not specified - PPO | Source: Ambulatory Visit | Attending: Internal Medicine | Admitting: Internal Medicine

## 2020-03-25 ENCOUNTER — Other Ambulatory Visit: Payer: Self-pay | Admitting: Internal Medicine

## 2020-03-25 DIAGNOSIS — R7989 Other specified abnormal findings of blood chemistry: Secondary | ICD-10-CM

## 2020-03-25 DIAGNOSIS — N390 Urinary tract infection, site not specified: Secondary | ICD-10-CM

## 2020-04-15 ENCOUNTER — Other Ambulatory Visit: Payer: Self-pay

## 2020-04-15 ENCOUNTER — Ambulatory Visit (INDEPENDENT_AMBULATORY_CARE_PROVIDER_SITE_OTHER): Payer: Federal, State, Local not specified - PPO | Admitting: Internal Medicine

## 2020-04-15 ENCOUNTER — Encounter: Payer: Self-pay | Admitting: Internal Medicine

## 2020-04-15 VITALS — BP 142/72 | HR 107 | Temp 98.3°F | Ht 72.0 in | Wt 149.0 lb

## 2020-04-15 DIAGNOSIS — F1020 Alcohol dependence, uncomplicated: Secondary | ICD-10-CM

## 2020-04-15 DIAGNOSIS — Z0000 Encounter for general adult medical examination without abnormal findings: Secondary | ICD-10-CM

## 2020-04-15 DIAGNOSIS — E559 Vitamin D deficiency, unspecified: Secondary | ICD-10-CM

## 2020-04-15 DIAGNOSIS — K7010 Alcoholic hepatitis without ascites: Secondary | ICD-10-CM | POA: Insufficient documentation

## 2020-04-15 DIAGNOSIS — R634 Abnormal weight loss: Secondary | ICD-10-CM

## 2020-04-15 NOTE — Progress Notes (Signed)
Subjective:  Patient ID: Travis Hicks, male    DOB: [birth year]  Age: 70 y.o. MRN: [id number]  CC: Follow-up (6 week f/u)   HPI   Well exam Travis Hicks presents for wt loss - it is better on Megace - pt gained 23 lbs   Outpatient Medications Prior to Visit  Medication Sig Dispense Refill  . ANORO ELLIPTA 62.5-25 MCG/INH AEPB INHALE 1 PUFF BY MOUTH EVERY DAY 60 each 11  . b complex vitamins tablet Take 1 tablet by mouth daily. 100 tablet 3  . Cholecalciferol (VITAMIN D3) 50 MCG (2000 UT) capsule Take 2 capsules (4,000 Units total) by mouth daily. 100 capsule 3  . latanoprost (XALATAN) 0.005 % ophthalmic solution     . cefUROXime (CEFTIN) 250 MG tablet Take 1 tablet (250 mg total) by mouth 2 (two) times daily with a meal. (Patient not taking: Reported on 04/15/2020) 28 tablet 0  . potassium chloride (KLOR-CON 10) 10 MEQ tablet Take 1 tablet (10 mEq total) by mouth 2 (two) times daily. (Patient not taking: Reported on 03/05/2020) 180 tablet 3   No facility-administered medications prior to visit.    ROS: Review of Systems  Constitutional: Negative for appetite change, fatigue and unexpected weight change.  HENT: Negative for congestion, nosebleeds, sneezing, sore throat and trouble swallowing.   Eyes: Negative for itching and visual disturbance.  Respiratory: Negative for cough.   Cardiovascular: Negative for chest pain, palpitations and leg swelling.  Gastrointestinal: Negative for abdominal distention, blood in stool, diarrhea and nausea.  Genitourinary: Negative for frequency and hematuria.  Musculoskeletal: Negative for back pain, gait problem, joint swelling and neck pain.  Skin: Negative for rash.  Neurological: Negative for dizziness, tremors, speech difficulty and weakness.  Psychiatric/Behavioral: Negative for agitation, dysphoric mood and sleep disturbance. The patient is not nervous/anxious.     Objective:  BP (!) 142/72 (BP Location: Left Arm)   Pulse (!) 107    Temp 98.3 F (36.8 C) (Oral)   Ht 6' (1.829 m)   Wt 149 lb (67.6 kg)   SpO2 96%   BMI 20.21 kg/m   BP Readings from Last 3 Encounters:  04/15/20 (!) 142/72  03/05/20 138/60  08/19/19 130/72    Wt Readings from Last 3 Encounters:  04/15/20 149 lb (67.6 kg)  03/05/20 126 lb 9.6 oz (57.4 kg)  08/19/19 138 lb (62.6 kg)    Physical Exam Constitutional:      General: He is not in acute distress.    Appearance: He is well-developed.     Comments: NAD  HENT:     Mouth/Throat:     Mouth: Oropharynx is clear and moist.  Eyes:     Conjunctiva/sclera: Conjunctivae normal.     Pupils: Pupils are equal, round, and reactive to light.  Neck:     Thyroid: No thyromegaly.     Vascular: No JVD.  Cardiovascular:     Rate and Rhythm: Normal rate and regular rhythm.     Pulses: Intact distal pulses.     Heart sounds: Normal heart sounds. No murmur heard. No friction rub. No gallop.   Pulmonary:     Effort: Pulmonary effort is normal. No respiratory distress.     Breath sounds: Normal breath sounds. No wheezing or rales.  Chest:     Chest wall: No tenderness.  Abdominal:     General: Bowel sounds are normal. There is no distension.     Palpations: Abdomen is soft. There is no mass.  Tenderness: There is no abdominal tenderness. There is no guarding or rebound.  Musculoskeletal:        General: No tenderness or edema. Normal range of motion.     Cervical back: Normal range of motion.  Lymphadenopathy:     Cervical: No cervical adenopathy.  Skin:    General: Skin is warm and dry.     Findings: No rash.  Neurological:     Mental Status: He is alert and oriented to person, place, and time.     Cranial Nerves: No cranial nerve deficit.     Motor: No abnormal muscle tone.     Coordination: He displays a negative Romberg sign. Coordination normal.     Gait: Gait normal.     Deep Tendon Reflexes: Reflexes are normal and symmetric.  Psychiatric:        Mood and Affect: Mood and  affect normal.        Behavior: Behavior normal.        Thought Content: Thought content normal.        Judgment: Judgment normal.     Lab Results  Component Value Date   WBC 6.5 03/05/2020   HGB 14.7 03/05/2020   HCT 41.8 03/05/2020   PLT 253 03/05/2020   GLUCOSE 103 (H) 03/05/2020   CHOL 140 03/05/2020   TRIG 166 (H) 03/05/2020   HDL 73 03/05/2020   LDLCALC 43 03/05/2020   ALT 76 (H) 03/05/2020   AST 131 (H) 03/05/2020   NA 142 03/05/2020   K 4.3 03/05/2020   CL 104 03/05/2020   CREATININE 0.77 03/05/2020   BUN 6 (L) 03/05/2020   CO2 24 03/05/2020   TSH 1.34 03/05/2020   PSA 0.37 03/05/2020    US Abdomen Complete  Result Date: 03/25/2020 CLINICAL DATA:  Elevated liver function tests EXAM: ABDOMEN ULTRASOUND COMPLETE COMPARISON:  None. FINDINGS: Gallbladder: No gallstones or wall thickening visualized. No sonographic Murphy sign noted by sonographer. Common bile duct: Diameter: 5 mm in proximal diameter Liver: The hepatic parenchymal in echogenicity is diffusely mildly increased in keeping with changes of mild hepatic steatosis. There is mild hepatomegaly. No focal intrahepatic masses identified and there is no intrahepatic biliary ductal dilation. Portal vein is patent on color Doppler imaging with normal direction of blood flow towards the liver. IVC: No abnormality visualized. Pancreas: Visualized portion unremarkable. Spleen: Size and appearance within normal limits. Right Kidney: Length: 11.1 cm. Echogenicity within normal limits. No mass or hydronephrosis visualized. Left Kidney: Length: 10.7 cm. Echogenicity within normal limits. No mass or hydronephrosis visualized. 14 mm simple cortical cyst noted within the interpolar region. Abdominal aorta: No aneurysm visualized. Other findings: None. IMPRESSION: Mild hepatic steatosis and hepatomegaly. Electronically Signed   By: Fidela Salisbury MD   On: 03/25/2020 15:50    Assessment & Plan:    Follow-up: No follow-ups on  file.  Walker Kehr, MD

## 2020-04-15 NOTE — Assessment & Plan Note (Signed)
On Vit D 

## 2020-04-15 NOTE — Assessment & Plan Note (Signed)
Monitor LFTs [place] w/fatty liver

## 2020-04-15 NOTE — Assessment & Plan Note (Addendum)
Much better on Megace.  Continue with Megace.  Start working out

## 2020-04-15 NOTE — Assessment & Plan Note (Signed)
discussed

## 2020-04-15 NOTE — Assessment & Plan Note (Addendum)
  We discussed age appropriate health related issues, including available/recomended screening tests and vaccinations. Labs were ordered to be later reviewed . All questions were answered. We discussed one or more of the following - seat belt use, use of sunscreen/sun exposure exercise, safe sex, fall risk reduction, second hand smoke exposure, firearm use and storage, seat belt use, a need for adhering to healthy diet and exercise. Labs were reviewed and new ordered.  All questions were answered. Last COLON 1/21  Coronary CT calcium info given 2019. 2020:  coronary calcium CT score is 0.  Declined all shots

## 2020-05-03 ENCOUNTER — Ambulatory Visit: Payer: Federal, State, Local not specified - PPO | Admitting: Internal Medicine

## 2020-05-03 ENCOUNTER — Encounter: Payer: Self-pay | Admitting: Internal Medicine

## 2020-05-03 ENCOUNTER — Other Ambulatory Visit: Payer: Self-pay

## 2020-05-03 VITALS — BP 120/70 | HR 111 | Temp 98.0°F | Resp 18 | Ht 72.0 in | Wt 148.2 lb

## 2020-05-03 DIAGNOSIS — K7010 Alcoholic hepatitis without ascites: Secondary | ICD-10-CM | POA: Diagnosis not present

## 2020-05-03 DIAGNOSIS — R1030 Lower abdominal pain, unspecified: Secondary | ICD-10-CM | POA: Diagnosis not present

## 2020-05-03 LAB — COMPREHENSIVE METABOLIC PANEL
ALT: 46 U/L (ref 0–53)
AST: 28 U/L (ref 0–37)
Albumin: 3.8 g/dL (ref 3.5–5.2)
Alkaline Phosphatase: 67 U/L (ref 39–117)
BUN: 12 mg/dL (ref 6–23)
CO2: 25 mEq/L (ref 19–32)
Calcium: 10.2 mg/dL (ref 8.4–10.5)
Chloride: 99 mEq/L (ref 96–112)
Creatinine, Ser: 0.77 mg/dL (ref 0.40–1.50)
GFR: 91.16 mL/min (ref >60.00)
Glucose, Bld: 88 mg/dL (ref 70–99)
Potassium: 4.3 mEq/L (ref 3.5–5.1)
Sodium: 134 mEq/L — ABNORMAL LOW (ref 135–145)
Total Bilirubin: 0.4 mg/dL (ref 0.2–1.2)
Total Protein: 7.6 g/dL (ref 6.0–8.3)

## 2020-05-03 LAB — URINALYSIS, ROUTINE W REFLEX MICROSCOPIC
Bilirubin Urine: NEGATIVE
Ketones, ur: NEGATIVE
Leukocytes,Ua: NEGATIVE
Nitrite: NEGATIVE
Specific Gravity, Urine: >=1.03 — AB (ref 1.000–1.030)
Total Protein, Urine: NEGATIVE
Urine Glucose: NEGATIVE
Urobilinogen, UA: 0.2 (ref 0.0–1.0)
WBC, UA: NONE SEEN (ref 0–?)
pH: 5.5 (ref 5.0–8.0)

## 2020-05-03 LAB — CBC
HCT: 40 % (ref 39.0–52.0)
Hemoglobin: 13.5 g/dL (ref 13.0–17.0)
MCHC: 33.7 g/dL (ref 30.0–36.0)
MCV: 100.6 fl — ABNORMAL HIGH (ref 78.0–100.0)
Platelets: 338 10*3/uL (ref 150.0–400.0)
RBC: 3.98 Mil/uL — ABNORMAL LOW (ref 4.22–5.81)
RDW: 14.2 % (ref 11.5–15.5)
WBC: 5.6 10*3/uL (ref 4.0–10.5)

## 2020-05-03 LAB — LIPASE: Lipase: 45 U/L (ref 11.0–59.0)

## 2020-05-03 NOTE — Assessment & Plan Note (Addendum)
Checking CBC, CMP, lipase and U/A. Adjust plan as needed. Currently pain is resolved.

## 2020-05-03 NOTE — Patient Instructions (Signed)
We will check the labs today and the urine to see if we can find what was causing the stomach pain.

## 2020-05-03 NOTE — Progress Notes (Signed)
   Subjective:   Patient ID: Travis Hicks, male    DOB: [birth year], 70 y.o.   MRN: [id number]  HPI The patient is a 70 YO man coming in for abdominal pain. Started about 6 days ago and is currently not present. Was having LLQ pain Tuesday, Wednesday, Friday. The pain was constant but not severe. Did have reduced appetite during that time. Having no nausea or vomiting. No current pain. Denies fevers or chills. No current alcohol usage. Smoker and still smoking same amount. Denies diarrhea or constipation or blood in stool. History of urine infection and he cannot tell when he has one. Pain does not radiate including to his back.   Review of Systems  Constitutional: Positive for appetite change. Negative for activity change, diaphoresis, fatigue, fever and unexpected weight change.  HENT: Negative.   Eyes: Negative.   Respiratory: Negative for cough, chest tightness and shortness of breath.   Cardiovascular: Negative for chest pain, palpitations and leg swelling.  Gastrointestinal: Positive for abdominal pain. Negative for abdominal distention, anal bleeding, blood in stool, constipation, diarrhea, nausea, rectal pain and vomiting.  Musculoskeletal: Negative.   Skin: Negative.   Neurological: Negative.   Psychiatric/Behavioral: Negative.     Objective:  Physical Exam Constitutional:      Appearance: He is well-developed.  HENT:     Head: Normocephalic and atraumatic.  Cardiovascular:     Rate and Rhythm: Normal rate and regular rhythm.  Pulmonary:     Effort: Pulmonary effort is normal. No respiratory distress.     Breath sounds: Normal breath sounds. No wheezing or rales.  Abdominal:     General: Bowel sounds are normal. There is no distension.     Palpations: Abdomen is soft.     Tenderness: There is no abdominal tenderness. There is no rebound.  Musculoskeletal:     Cervical back: Normal range of motion.  Skin:    General: Skin is warm and dry.  Neurological:     Mental  Status: He is alert and oriented to person, place, and time.     Coordination: Coordination normal.     Vitals:   05/03/20 1325  BP: 120/70  Pulse: (!) 11  Resp: 18  SpO2: 95%  Weight: 148 lb 3.2 oz (67.2 kg)  Height: 6' (1.829 m)    This visit occurred during the SARS-CoV-2 public health emergency.  Safety protocols were in place, including screening questions prior to the visit, additional usage of staff PPE, and extensive cleaning of exam room while observing appropriate contact time as indicated for disinfecting solutions.   Assessment & Plan:

## 2020-05-03 NOTE — Assessment & Plan Note (Signed)
Checking LFTs today as they were elevated recently with PCP. Recent US liver reviewed without new findings.

## 2020-05-06 ENCOUNTER — Other Ambulatory Visit: Payer: Self-pay | Admitting: Internal Medicine

## 2020-05-06 MED ORDER — NITROFURANTOIN MONOHYD MACRO 100 MG PO CAPS: 100.0000 mg | ORAL_CAPSULE | Freq: Two times a day (BID) | ORAL | 0 refills | Status: DC

## 2020-06-10 ENCOUNTER — Other Ambulatory Visit: Payer: Self-pay | Admitting: Internal Medicine

## 2020-07-09 ENCOUNTER — Other Ambulatory Visit (INDEPENDENT_AMBULATORY_CARE_PROVIDER_SITE_OTHER): Payer: Federal, State, Local not specified - PPO

## 2020-07-09 ENCOUNTER — Other Ambulatory Visit: Payer: Self-pay

## 2020-07-09 DIAGNOSIS — F1020 Alcohol dependence, uncomplicated: Secondary | ICD-10-CM

## 2020-07-09 DIAGNOSIS — Z0000 Encounter for general adult medical examination without abnormal findings: Secondary | ICD-10-CM | POA: Diagnosis not present

## 2020-07-09 DIAGNOSIS — R634 Abnormal weight loss: Secondary | ICD-10-CM

## 2020-07-09 DIAGNOSIS — E559 Vitamin D deficiency, unspecified: Secondary | ICD-10-CM | POA: Diagnosis not present

## 2020-07-09 LAB — CBC WITH DIFFERENTIAL/PLATELET
Basophils Absolute: 0.1 10*3/uL (ref 0.0–0.1)
Basophils Relative: 0.9 % (ref 0.0–3.0)
Eosinophils Absolute: 0.2 10*3/uL (ref 0.0–0.7)
Eosinophils Relative: 3.1 % (ref 0.0–5.0)
HCT: 39.9 % (ref 39.0–52.0)
Hemoglobin: 13.4 g/dL (ref 13.0–17.0)
Lymphocytes Relative: 26.5 % (ref 12.0–46.0)
Lymphs Abs: 1.9 10*3/uL (ref 0.7–4.0)
MCHC: 33.7 g/dL (ref 30.0–36.0)
MCV: 94.5 fl (ref 78.0–100.0)
Monocytes Absolute: 0.7 10*3/uL (ref 0.1–1.0)
Monocytes Relative: 9.2 % (ref 3.0–12.0)
Neutro Abs: 4.3 10*3/uL (ref 1.4–7.7)
Neutrophils Relative %: 60.3 % (ref 43.0–77.0)
Platelets: 369 10*3/uL (ref 150.0–400.0)
RBC: 4.22 Mil/uL (ref 4.22–5.81)
RDW: 14.4 % (ref 11.5–15.5)
WBC: 7.1 10*3/uL (ref 4.0–10.5)

## 2020-07-09 LAB — COMPREHENSIVE METABOLIC PANEL
ALT: 16 U/L (ref 0–53)
AST: 15 U/L (ref 0–37)
Albumin: 4.2 g/dL (ref 3.5–5.2)
Alkaline Phosphatase: 40 U/L (ref 39–117)
BUN: 12 mg/dL (ref 6–23)
CO2: 24 mEq/L (ref 19–32)
Calcium: 9.9 mg/dL (ref 8.4–10.5)
Chloride: 102 mEq/L (ref 96–112)
Creatinine, Ser: 0.8 mg/dL (ref 0.40–1.50)
GFR: 90 mL/min (ref >60.00)
Glucose, Bld: 92 mg/dL (ref 70–99)
Potassium: 3.9 mEq/L (ref 3.5–5.1)
Sodium: 136 mEq/L (ref 135–145)
Total Bilirubin: 1 mg/dL (ref 0.2–1.2)
Total Protein: 7 g/dL (ref 6.0–8.3)

## 2020-07-15 ENCOUNTER — Ambulatory Visit: Payer: Federal, State, Local not specified - PPO | Admitting: Internal Medicine

## 2020-07-27 ENCOUNTER — Other Ambulatory Visit: Payer: Self-pay

## 2020-07-27 ENCOUNTER — Ambulatory Visit: Payer: Federal, State, Local not specified - PPO | Admitting: Internal Medicine

## 2020-07-27 ENCOUNTER — Encounter: Payer: Self-pay | Admitting: Internal Medicine

## 2020-07-27 DIAGNOSIS — F17200 Nicotine dependence, unspecified, uncomplicated: Secondary | ICD-10-CM

## 2020-07-27 DIAGNOSIS — B029 Zoster without complications: Secondary | ICD-10-CM | POA: Diagnosis not present

## 2020-07-27 DIAGNOSIS — R634 Abnormal weight loss: Secondary | ICD-10-CM

## 2020-07-27 DIAGNOSIS — R413 Other amnesia: Secondary | ICD-10-CM

## 2020-07-27 DIAGNOSIS — J431 Panlobular emphysema: Secondary | ICD-10-CM

## 2020-07-27 DIAGNOSIS — H6123 Impacted cerumen, bilateral: Secondary | ICD-10-CM | POA: Diagnosis not present

## 2020-07-27 MED ORDER — MEGESTROL ACETATE 40 MG/ML PO SUSP: ORAL | 5 refills | Status: DC

## 2020-07-27 NOTE — Assessment & Plan Note (Addendum)
Ongoing weight loss.  Better on Megace.  Continue with Megace.  Risks and benefits of long-term Megace use were discussed with patient in detail

## 2020-07-27 NOTE — Patient Instructions (Signed)
Shingles  Shingles is an infection. It gives you a painful skin rash and blisters that have fluid in them. Shingles is caused by the same germ (virus) that causes chickenpox. Shingles only happens in people who:  Have had chickenpox.  Have been given a shot of medicine (vaccine) to protect against chickenpox. Shingles is rare in this group. The first symptoms of shingles may be itching, tingling, or pain in an area on your skin. A rash will show on your skin a few days or weeks later. The rash is likely to be on one side of your body. The rash usually has a shape like a belt or a band. Over time, the rash turns into fluid-filled blisters. The blisters will break open, change into scabs, and dry up. Medicines may:  Help with pain and itching.  Help you get better sooner.  Help to prevent long-term problems. Follow these instructions at home: Medicines  Take over-the-counter and prescription medicines only as told by your doctor.  Put on an anti-itch cream or numbing cream where you have a rash, blisters, or scabs. Do this as told by your doctor. Helping with itching and discomfort  Put cold, wet cloths (cold compresses) on the area of the rash or blisters as told by your doctor.  Cool baths can help you feel better. Try adding baking soda or dry oatmeal to the water to lessen itching. Do not bathe in hot water.   Blister and rash care  Keep your rash covered with a loose bandage (dressing).  Wear loose clothing that does not rub on your rash.  Keep your rash and blisters clean. To do this, wash the area with mild soap and cool water as told by your doctor.  Check your rash every day for signs of infection. Check for: ? More redness, swelling, or pain. ? Fluid or blood. ? Warmth. ? Pus or a bad smell.  Do not scratch your rash. Do not pick at your blisters. To help you to not scratch: ? Keep your fingernails clean and cut short. ? Wear gloves or mittens when you sleep, if  scratching is a problem. General instructions  Rest as told by your doctor.  Keep all follow-up visits as told by your doctor. This is important.  Wash your hands often with soap and water. If soap and water are not available, use hand sanitizer. Doing this lowers your chance of getting a skin infection caused by germs (bacteria).  Your infection can cause chickenpox in people who have never had chickenpox or never got a shot of chickenpox vaccine. If you have blisters that did not change into scabs yet, try not to touch other people or be around other people, especially: ? Babies. ? Pregnant women. ? Children who have areas of red, itchy, or rough skin (eczema). ? Very old people who have transplants. ? People who have a long-term (chronic) sickness, like cancer or AIDS. Contact a doctor if:  Your pain does not get better with medicine.  Your pain does not get better after the rash heals.  You have any signs of infection in the rash area. These signs include: ? More redness, swelling, or pain around the rash. ? Fluid or blood coming from the rash. ? The rash area feeling warm to the touch. ? Pus or a bad smell coming from the rash. Get help right away if:  The rash is on your face or nose.  You have pain in your face or pain  by your eye.  You lose feeling on one side of your face.  You have trouble seeing.  You have ear pain, or you have ringing in your ear.  You have a loss of taste.  Your condition gets worse. Summary  Shingles gives you a painful skin rash and blisters that have fluid in them.  Shingles is an infection. It is caused by the same germ (virus) that causes chickenpox.  Keep your rash covered with a loose bandage (dressing). Wear loose clothing that does not rub on your rash.  If you have blisters that did not change into scabs yet, try not to touch other people or be around people. This information is not intended to replace advice given to you by  your health care provider. Make sure you discuss any questions you have with your health care provider. Document Revised: 05/31/2018 Document Reviewed: 10/11/2016 Elsevier Patient Education  2021 [employer].

## 2020-07-27 NOTE — Progress Notes (Signed)
Subjective:  Patient ID: Travis Hicks, male    DOB: [birth year]  Age: 70 y.o. MRN: [id number]  CC: Follow-up (3 month f/u)   HPI Travis Hicks presents for weight loss.  He is complaining of hearing loss and a plugged up sensation in the ears  Outpatient Medications Prior to Visit  Medication Sig Dispense Refill   ANORO ELLIPTA 62.5-25 MCG/INH AEPB INHALE 1 PUFF BY MOUTH EVERY DAY 60 each 11   b complex vitamins tablet Take 1 tablet by mouth daily. 100 tablet 3   Cholecalciferol (VITAMIN D3) 50 MCG (2000 UT) capsule Take 2 capsules (4,000 Units total) by mouth daily. 100 capsule 3   latanoprost (XALATAN) 0.005 % ophthalmic solution      megestrol (MEGACE) 40 MG/ML suspension TAKE 10 MLS (400 MG TOTAL) BY MOUTH 2 (TWO) TIMES DAILY. 480 mL 5   nitrofurantoin, macrocrystal-monohydrate, (MACROBID) 100 MG capsule Take 1 capsule (100 mg total) by mouth 2 (two) times daily. (Patient not taking: Reported on 07/27/2020) 14 capsule 0   No facility-administered medications prior to visit.    ROS: Review of Systems  Constitutional:  Positive for unexpected weight change. Negative for appetite change and fatigue.  HENT:  Negative for congestion, nosebleeds, sneezing, sore throat and trouble swallowing.   Eyes:  Negative for itching and visual disturbance.  Respiratory:  Negative for cough.   Cardiovascular:  Negative for chest pain, palpitations and leg swelling.  Gastrointestinal:  Negative for abdominal distention, blood in stool, diarrhea and nausea.  Genitourinary:  Negative for frequency and hematuria.  Musculoskeletal:  Negative for back pain, gait problem, joint swelling and neck pain.  Skin:  Negative for rash.  Neurological:  Negative for dizziness, tremors, speech difficulty and weakness.  Psychiatric/Behavioral:  Negative for agitation, dysphoric mood and sleep disturbance. The patient is not nervous/anxious.    Objective:  BP 132/88 (BP Location: Left Arm)   Pulse (!) 110   Temp  98.8 F (37.1 C) (Oral)   Ht 6' (1.829 m)   Wt 144 lb 12.8 oz (65.7 kg)   SpO2 94%   BMI 19.64 kg/m   BP Readings from Last 3 Encounters:  07/27/20 132/88  05/03/20 120/70  04/15/20 (!) 142/72    Wt Readings from Last 3 Encounters:  07/27/20 144 lb 12.8 oz (65.7 kg)  05/03/20 148 lb 3.2 oz (67.2 kg)  04/15/20 149 lb (67.6 kg)    Physical Exam Constitutional:      General: He is not in acute distress.    Appearance: He is well-developed.     Comments: NAD  HENT:     Right Ear: There is impacted cerumen.     Left Ear: There is impacted cerumen.  Eyes:     Conjunctiva/sclera: Conjunctivae normal.     Pupils: Pupils are equal, round, and reactive to light.  Neck:     Thyroid: No thyromegaly.     Vascular: No JVD.  Cardiovascular:     Rate and Rhythm: Normal rate and regular rhythm.     Heart sounds: Normal heart sounds. No murmur heard.   No friction rub. No gallop.  Pulmonary:     Effort: Pulmonary effort is normal. No respiratory distress.     Breath sounds: Normal breath sounds. No wheezing or rales.  Chest:     Chest wall: No tenderness.  Abdominal:     General: Bowel sounds are normal. There is no distension.     Palpations: Abdomen is soft. There is no mass.  Tenderness: There is no abdominal tenderness. There is no guarding or rebound.  Musculoskeletal:        General: No tenderness. Normal range of motion.     Cervical back: Normal range of motion.  Lymphadenopathy:     Cervical: No cervical adenopathy.  Skin:    General: Skin is warm and dry.     Findings: No rash.  Neurological:     Mental Status: He is alert and oriented to person, place, and time.     Cranial Nerves: No cranial nerve deficit.     Motor: No abnormal muscle tone.     Coordination: Coordination normal.     Gait: Gait normal.     Deep Tendon Reflexes: Reflexes are normal and symmetric.  Psychiatric:        Behavior: Behavior normal.        Thought Content: Thought content normal.         Judgment: Judgment normal.    Procedure Note :     Procedure :  Ear irrigation right and left ears   Indication:  Cerumen impaction right and left ears   Risks, including pain, dizziness, eardrum perforation, bleeding, infection and others as well as benefits were explained to the patient in detail. Verbal consent was obtained and the patient agreed to proceed.    We used "The Elephant Ear Irrigation Device" filled with lukewarm water for irrigation. A large amount wax was recovered from both ears. Procedure has also required manual wax removal/instrumentation with an ear wax curette and ear forceps on the right and left ears.  Skin irritation in both ear canals was observed following earwax extraction.   Tolerated well. Complications: None.   Postprocedure instructions : Use prescribed eardrops.  Call if problems.  Lab Results  Component Value Date   WBC 7.1 07/09/2020   HGB 13.4 07/09/2020   HCT 39.9 07/09/2020   PLT 369.0 07/09/2020   GLUCOSE 92 07/09/2020   CHOL 140 03/05/2020   TRIG 166 (H) 03/05/2020   HDL 73 03/05/2020   LDLCALC 43 03/05/2020   ALT 16 07/09/2020   AST 15 07/09/2020   NA 136 07/09/2020   K 3.9 07/09/2020   CL 102 07/09/2020   CREATININE 0.80 07/09/2020   BUN 12 07/09/2020   CO2 24 07/09/2020   TSH 1.34 03/05/2020   PSA 0.37 03/05/2020    US Abdomen Complete  Result Date: 03/25/2020 CLINICAL DATA:  Elevated liver function tests EXAM: ABDOMEN ULTRASOUND COMPLETE COMPARISON:  None. FINDINGS: Gallbladder: No gallstones or wall thickening visualized. No sonographic Murphy sign noted by sonographer. Common bile duct: Diameter: 5 mm in proximal diameter Liver: The hepatic parenchymal in echogenicity is diffusely mildly increased in keeping with changes of mild hepatic steatosis. There is mild hepatomegaly. No focal intrahepatic masses identified and there is no intrahepatic biliary ductal dilation. Portal vein is patent on color Doppler imaging with  normal direction of blood flow towards the liver. IVC: No abnormality visualized. Pancreas: Visualized portion unremarkable. Spleen: Size and appearance within normal limits. Right Kidney: Length: 11.1 cm. Echogenicity within normal limits. No mass or hydronephrosis visualized. Left Kidney: Length: 10.7 cm. Echogenicity within normal limits. No mass or hydronephrosis visualized. 14 mm simple cortical cyst noted within the interpolar region. Abdominal aorta: No aneurysm visualized. Other findings: None. IMPRESSION: Mild hepatic steatosis and hepatomegaly. Electronically Signed   By: Fidela Salisbury MD   On: 03/25/2020 15:50    Assessment & Plan:   There are no diagnoses linked  to this encounter.   No orders of the defined types were placed in this encounter.    Follow-up: No follow-ups on file.  Walker Kehr, MD

## 2020-07-27 NOTE — Assessment & Plan Note (Addendum)
L LS spine, LLE. Too late to treat

## 2020-08-03 ENCOUNTER — Encounter: Payer: Self-pay | Admitting: Internal Medicine

## 2020-08-03 NOTE — Assessment & Plan Note (Signed)
Ongoing smoking discussed

## 2020-08-03 NOTE — Assessment & Plan Note (Signed)
Better with better nutrition

## 2020-08-03 NOTE — Assessment & Plan Note (Signed)
Recurrence-worse.  We will remove.  See procedure. External otitis was noted.  Prescribed Cortisporin eardrops

## 2020-10-27 ENCOUNTER — Ambulatory Visit: Payer: Federal, State, Local not specified - PPO | Admitting: Internal Medicine

## 2020-11-08 ENCOUNTER — Other Ambulatory Visit: Payer: Self-pay

## 2020-11-09 ENCOUNTER — Ambulatory Visit: Payer: Federal, State, Local not specified - PPO | Admitting: Internal Medicine

## 2020-11-09 ENCOUNTER — Encounter: Payer: Self-pay | Admitting: Internal Medicine

## 2020-11-09 DIAGNOSIS — R1030 Lower abdominal pain, unspecified: Secondary | ICD-10-CM | POA: Diagnosis not present

## 2020-11-09 DIAGNOSIS — B029 Zoster without complications: Secondary | ICD-10-CM | POA: Diagnosis not present

## 2020-11-09 DIAGNOSIS — R634 Abnormal weight loss: Secondary | ICD-10-CM

## 2020-11-09 DIAGNOSIS — L989 Disorder of the skin and subcutaneous tissue, unspecified: Secondary | ICD-10-CM | POA: Diagnosis not present

## 2020-11-09 DIAGNOSIS — H402290 Chronic angle-closure glaucoma, unspecified eye, stage unspecified: Secondary | ICD-10-CM

## 2020-11-09 MED ORDER — TRIAMCINOLONE ACETONIDE 0.5 % EX OINT: 1.0000 "application " | TOPICAL_OINTMENT | Freq: Three times a day (TID) | CUTANEOUS | 1 refills | Status: DC

## 2020-11-09 MED ORDER — AMOXICILLIN-POT CLAVULANATE 875-125 MG PO TABS: 1.0000 | ORAL_TABLET | Freq: Two times a day (BID) | ORAL | 0 refills | Status: DC

## 2020-11-09 NOTE — Assessment & Plan Note (Signed)
LLE Bx if not better Band aid Triamc oint

## 2020-11-09 NOTE — Patient Instructions (Signed)
Band aids  over skin lesions  Diverticulosis Diverticulosis is a condition that develops when small pouches (diverticula) form in the wall of the large intestine (colon). The colon is where water is absorbed and stool (feces) is formed. The pouches form when the inside layer of the colon pushes through weak spots in the outer layers of the colon. You may have a few pouches or many of them. The pouches usually do not cause problems unless they become inflamed or infected. When this happens, the condition is called diverticulitis. What are the causes? The cause of this condition is not known. What increases the risk? The following factors may make you more likely to develop this condition: Being older than age 64. Your risk for this condition increases with age. Diverticulosis is rare among people younger than age 64. By age 32, many people have it. Eating a low-fiber diet. Having frequent constipation. Being overweight. Not getting enough exercise. Smoking. Taking over-the-counter pain medicines, like aspirin and ibuprofen. Having a family history of diverticulosis. What are the signs or symptoms? In most people, there are no symptoms of this condition. If you do have symptoms, they may include: Bloating. Cramps in the abdomen. Constipation or diarrhea. Pain in the lower left side of the abdomen. How is this diagnosed? Because diverticulosis usually has no symptoms, it is most often diagnosed during an exam for other colon problems. The condition may be diagnosed by: Using a flexible scope to examine the colon (colonoscopy). Taking an X-ray of the colon after dye has been put into the colon (barium enema). Having a CT scan. How is this treated? You may not need treatment for this condition. Your health care provider may recommend treatment to prevent problems. You may need treatment if you have symptoms or if you previously had diverticulitis. Treatment may include: Eating a high-fiber  diet. Taking a fiber supplement. Taking a live bacteria supplement (probiotic). Taking medicine to relax your colon. Follow these instructions at home: Medicines Take over-the-counter and prescription medicines only as told by your health care provider. If told by your health care provider, take a fiber supplement or probiotic. Constipation prevention Your condition may cause constipation. To prevent or treat constipation, you may need to: Drink enough fluid to keep your urine pale yellow. Take over-the-counter or prescription medicines. Eat foods that are high in fiber, such as beans, whole grains, and fresh fruits and vegetables. Limit foods that are high in fat and processed sugars, such as fried or sweet foods.  General instructions Try not to strain when you have a bowel movement. Keep all follow-up visits as told by your health care provider. This is important. Contact a health care provider if you: Have pain in your abdomen. Have bloating. Have cramps. Have not had a bowel movement in 3 days. Get help right away if: Your pain gets worse. Your bloating becomes very bad. You have a fever or chills, and your symptoms suddenly get worse. You vomit. You have bowel movements that are bloody or black. You have bleeding from your rectum. Summary Diverticulosis is a condition that develops when small pouches (diverticula) form in the wall of the large intestine (colon). You may have a few pouches or many of them. This condition is most often diagnosed during an exam for other colon problems. Treatment may include increasing the fiber in your diet, taking supplements, or taking medicines. This information is not intended to replace advice given to you by your health care provider. Make sure you  discuss any questions you have with your health care provider. Document Revised: 09/05/2018 Document Reviewed: 09/05/2018 Elsevier Patient Education  [place].

## 2020-11-09 NOTE — Assessment & Plan Note (Signed)
New LLQ pain - ?mild diverticulitis: empiric Augmentin. Metamucil  Dr Fuller Plan - last colon 2021 w/mild diverticulosis. Due in 2028

## 2020-11-09 NOTE — Assessment & Plan Note (Signed)
F/u w/ophth

## 2020-11-09 NOTE — Assessment & Plan Note (Addendum)
Wt Readings from Last 3 Encounters:  11/09/20 164 lb 9.6 oz (74.7 kg)  07/27/20 144 lb 12.8 oz (65.7 kg)  05/03/20 148 lb 3.2 oz (67.2 kg)   Megace helped - he stopped it now. Better w/new dentures. No taste of smell, taste x 4 years

## 2020-11-09 NOTE — Assessment & Plan Note (Signed)
Resolved

## 2020-11-09 NOTE — Progress Notes (Signed)
Subjective:  Patient ID: Travis Hicks, male    DOB: 16-Sep-1950  Age: 70 y.o. MRN: [id number]  CC: Follow-up (3 MONTH F/U)   HPI Travis Hicks presents for L ankle rash. F/u wt loss  C/o LLE rash/lesion w/a scab C/o LLQ abd iss ore at times off and on x 2 months  Outpatient Medications Prior to Visit  Medication Sig Dispense Refill   ANORO ELLIPTA 62.5-25 MCG/INH AEPB INHALE 1 PUFF BY MOUTH EVERY DAY 60 each 11   b complex vitamins tablet Take 1 tablet by mouth daily. 100 tablet 3   Cholecalciferol (VITAMIN D3) 50 MCG (2000 UT) capsule Take 2 capsules (4,000 Units total) by mouth daily. 100 capsule 3   latanoprost (XALATAN) 0.005 % ophthalmic solution      megestrol (MEGACE) 40 MG/ML suspension TAKE 10 MLS (400 MG TOTAL) BY MOUTH 2 (TWO) TIMES DAILY. 480 mL 5   No facility-administered medications prior to visit.    ROS: Review of Systems  Constitutional:  Negative for appetite change, fatigue and unexpected weight change.  HENT:  Negative for congestion, nosebleeds, sneezing, sore throat and trouble swallowing.   Eyes:  Negative for itching and visual disturbance.  Respiratory:  Negative for cough.   Cardiovascular:  Negative for chest pain, palpitations and leg swelling.  Gastrointestinal:  Positive for abdominal pain. Negative for abdominal distention, blood in stool, diarrhea and nausea.  Genitourinary:  Negative for frequency and hematuria.  Musculoskeletal:  Negative for back pain, gait problem, joint swelling and neck pain.  Skin:  Positive for rash.  Neurological:  Negative for dizziness, tremors, speech difficulty and weakness.  Psychiatric/Behavioral:  Negative for agitation, dysphoric mood and sleep disturbance. The patient is not nervous/anxious.    Objective:  BP 140/72 (BP Location: Left Arm)   Pulse (!) 44   Temp 97.9 F (36.6 C) (Oral)   Ht 6' (1.829 m)   Wt 164 lb 9.6 oz (74.7 kg)   SpO2 95%   BMI 22.32 kg/m   BP Readings from Last 3 Encounters:   11/09/20 140/72  07/27/20 132/88  05/03/20 120/70    Wt Readings from Last 3 Encounters:  11/09/20 164 lb 9.6 oz (74.7 kg)  07/27/20 144 lb 12.8 oz (65.7 kg)  05/03/20 148 lb 3.2 oz (67.2 kg)    Physical Exam Constitutional:      General: He is not in acute distress.    Appearance: He is well-developed.     Comments: NAD  Eyes:     Conjunctiva/sclera: Conjunctivae normal.     Pupils: Pupils are equal, round, and reactive to light.  Neck:     Thyroid: No thyromegaly.     Vascular: No JVD.  Cardiovascular:     Rate and Rhythm: Normal rate and regular rhythm.     Heart sounds: Normal heart sounds. No murmur heard.   No friction rub. No gallop.  Pulmonary:     Effort: Pulmonary effort is normal. No respiratory distress.     Breath sounds: Normal breath sounds. No wheezing or rales.  Chest:     Chest wall: No tenderness.  Abdominal:     General: Bowel sounds are normal. There is no distension.     Palpations: Abdomen is soft. There is no mass.     Tenderness: There is no abdominal tenderness. There is no guarding or rebound.  Musculoskeletal:        General: No tenderness. Normal range of motion.     Cervical back: Normal range  of motion.  Lymphadenopathy:     Cervical: No cervical adenopathy.  Skin:    General: Skin is warm and dry.     Findings: No rash.  Neurological:     Mental Status: He is alert and oriented to person, place, and time.     Cranial Nerves: No cranial nerve deficit.     Motor: No abnormal muscle tone.     Coordination: Coordination normal.     Gait: Gait normal.     Deep Tendon Reflexes: Reflexes are normal and symmetric.  Psychiatric:        Behavior: Behavior normal.        Thought Content: Thought content normal.        Judgment: Judgment normal.    Lab Results  Component Value Date   WBC 7.1 07/09/2020   HGB 13.4 07/09/2020   HCT 39.9 07/09/2020   PLT 369.0 07/09/2020   GLUCOSE 92 07/09/2020   CHOL 140 03/05/2020   TRIG 166 (H)  03/05/2020   HDL 73 03/05/2020   LDLCALC 43 03/05/2020   ALT 16 07/09/2020   AST 15 07/09/2020   NA 136 07/09/2020   K 3.9 07/09/2020   CL 102 07/09/2020   CREATININE 0.80 07/09/2020   BUN 12 07/09/2020   CO2 24 07/09/2020   TSH 1.34 03/05/2020   PSA 0.37 03/05/2020    US Abdomen Complete  Result Date: 03/25/2020 CLINICAL DATA:  Elevated liver function tests EXAM: ABDOMEN ULTRASOUND COMPLETE COMPARISON:  None. FINDINGS: Gallbladder: No gallstones or wall thickening visualized. No sonographic Murphy sign noted by sonographer. Common bile duct: Diameter: 5 mm in proximal diameter Liver: The hepatic parenchymal in echogenicity is diffusely mildly increased in keeping with changes of mild hepatic steatosis. There is mild hepatomegaly. No focal intrahepatic masses identified and there is no intrahepatic biliary ductal dilation. Portal vein is patent on color Doppler imaging with normal direction of blood flow towards the liver. IVC: No abnormality visualized. Pancreas: Visualized portion unremarkable. Spleen: Size and appearance within normal limits. Right Kidney: Length: 11.1 cm. Echogenicity within normal limits. No mass or hydronephrosis visualized. Left Kidney: Length: 10.7 cm. Echogenicity within normal limits. No mass or hydronephrosis visualized. 14 mm simple cortical cyst noted within the interpolar region. Abdominal aorta: No aneurysm visualized. Other findings: None. IMPRESSION: Mild hepatic steatosis and hepatomegaly. Electronically Signed   By: Fidela Salisbury MD   On: 03/25/2020 15:50    Assessment & Plan:   Problem List Items Addressed This Visit   None     Follow-up: No follow-ups on file.  Walker Kehr, MD

## 2020-11-21 ENCOUNTER — Emergency Department (HOSPITAL_COMMUNITY): Payer: Federal, State, Local not specified - PPO

## 2020-11-21 ENCOUNTER — Emergency Department (HOSPITAL_COMMUNITY)
Admission: EM | Admit: 2020-11-21 | Discharge: 2020-11-21 | Disposition: A | Discharge status: Home / Self Care | Payer: Federal, State, Local not specified - PPO | Attending: Emergency Medicine | Admitting: Emergency Medicine

## 2020-11-21 ENCOUNTER — Encounter (HOSPITAL_COMMUNITY): Payer: Self-pay | Admitting: *Deleted

## 2020-11-21 ENCOUNTER — Other Ambulatory Visit: Payer: Self-pay

## 2020-11-21 DIAGNOSIS — R000 Tachycardia, unspecified: Secondary | ICD-10-CM | POA: Diagnosis not present

## 2020-11-21 DIAGNOSIS — J449 Chronic obstructive pulmonary disease, unspecified: Secondary | ICD-10-CM | POA: Insufficient documentation

## 2020-11-21 DIAGNOSIS — U071 COVID-19: Secondary | ICD-10-CM | POA: Insufficient documentation

## 2020-11-21 DIAGNOSIS — F17210 Nicotine dependence, cigarettes, uncomplicated: Secondary | ICD-10-CM | POA: Diagnosis not present

## 2020-11-21 DIAGNOSIS — R059 Cough, unspecified: Secondary | ICD-10-CM | POA: Diagnosis present

## 2020-11-21 DIAGNOSIS — Z7951 Long term (current) use of inhaled steroids: Secondary | ICD-10-CM | POA: Insufficient documentation

## 2020-11-21 MED ORDER — PREDNISONE 20 MG PO TABS: 40.0000 mg | ORAL_TABLET | Freq: Every day | ORAL | 0 refills | Status: DC

## 2020-11-21 MED ORDER — NIRMATRELVIR/RITONAVIR (PAXLOVID)TABLET
3.0000 | ORAL_TABLET | Freq: Two times a day (BID) | ORAL | 0 refills | Status: AC
Start: 2020-11-21 — End: 2020-11-26

## 2020-11-21 NOTE — ED Provider Notes (Signed)
[facility] EMERGENCY DEPARTMENT Provider Note   CSN: [id number] Arrival date & time: 11/21/20  1423     History Chief Complaint  Patient presents with   Cough    Travis Hicks is a 70 y.o. male.  Patient is a 70 year old male with a history of COPD with ongoing tobacco use and daily alcohol use who is presenting today with 3 days of nasal congestion, cough and shortness of breath.  He reports it was really bad on Friday and Saturday but then today the cough has improved and he is feeling better.  He did have a mild headache today and ongoing nasal congestion and took a home COVID test that was positive.  He called his doctor and because of his history of COPD they recommended he come to the emergency room.  He is currently denying any shortness of breath.  He has been using his inhalers at home.  He denies any chest pain, abdominal pain, nausea or vomiting.  He is still been eating and drinking.  No unilateral leg pain or swelling.   Cough     Past Medical History:  Diagnosis Date   Cataract    veru small per pt .   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 coli UTI (urinary tract infection) 04/29/2013   Wart viral 01/14/2013   Alcoholism ([place]) 01/14/2013   Lower abdominal pain 01/14/2013   Well adult exam 05/23/2011   SMOKER  12/07/2008   Cerumen impaction 12/02/2007   Abnormal CT of the abdomen 11/27/2006   Hematuria 11/27/2006   IMPOTENCE 11/27/2006   GERD 11/26/2006   COLONIC POLYPS, HX OF 11/26/2006    Past Surgical History:  Procedure Laterality Date   COLONOSCOPY     10-10-02,11-21-2005   DENTAL SURGERY     had 14 teeth pulled    HAND SURGERY Right    POLYPECTOMY     10-10-02,11-21-05       Family History  Problem Relation Age of Onset   Pancreatic cancer Father    Glaucoma Brother    Colon cancer Neg Hx    Rectal cancer Neg Hx    Stomach cancer Neg Hx    Esophageal cancer Neg Hx    Prostate cancer Neg Hx    Colon polyps Neg Hx     Social History   Tobacco Use   Smoking status: Every Day    Packs/day: 1.00    Types: Cigarettes   Smokeless tobacco: Never   Tobacco comments:    tobacco info given  Vaping Use   Vaping Use: Never used  Substance Use Topics   Alcohol use: Yes    Alcohol/week: 0.0 standard drinks    Comment: a pint 5 days a week   Drug use: No    Home Medications Prior to Admission medications   Medication  Sig Start Date End Date Taking? Authorizing Provider  nirmatrelvir/ritonavir EUA (PAXLOVID) 20 x 150 MG & 10 x 100MG  TABS Take 3 tablets by mouth 2 (two) times daily for 5 days. Patient GFR is 90. Take nirmatrelvir (150 mg) two tablets twice daily for 5 days and ritonavir (100 mg) one tablet twice daily for 5 days. 11/21/20 11/26/20 Yes Brieana Shimmin, Loree Fee, MD  predniSONE (DELTASONE) 20 MG tablet Take 2 tablets (40 mg total) by mouth daily. Start in the next few days if you start feeling tight and wheezy 11/21/20  Yes Blanchie Dessert, MD  amoxicillin-clavulanate (AUGMENTIN) 875-125 MG tablet Take 1 tablet by mouth 2 (two) times daily. 11/09/20   Plotnikov, Evie Lacks, MD  ANORO ELLIPTA 62.5-25 MCG/INH AEPB INHALE 1 PUFF BY MOUTH EVERY DAY 02/17/20   Plotnikov, Evie Lacks, MD  b complex vitamins tablet Take 1 tablet by mouth daily. 01/14/13   Plotnikov, Evie Lacks, MD   Cholecalciferol (VITAMIN D3) 50 MCG (2000 UT) capsule Take 2 capsules (4,000 Units total) by mouth daily. 03/11/20   Plotnikov, Evie Lacks, MD  latanoprost (XALATAN) 0.005 % ophthalmic solution  12/01/16   [provider]  triamcinolone ointment (KENALOG) 0.5 % Apply 1 application topically 3 (three) times daily. 11/09/20 11/09/21  Plotnikov, Evie Lacks, MD    Allergies    Viagra [sildenafil citrate]  Review of Systems   Review of Systems  Respiratory:  Positive for cough.   All other systems reviewed and are negative.  Physical Exam Updated Vital Signs BP 136/71 (BP Location: Left Arm)   Pulse (!) 104   Temp 98.8 F (37.1 C)   Resp 16   Ht 6' (1.829 m)   Wt 74.7 kg   SpO2 93%   BMI 22.34 kg/m   Physical Exam Vitals and nursing note reviewed.  Constitutional:      General: He is not in acute distress.    Appearance: Normal appearance. He is well-developed.  HENT:     Head: Normocephalic and atraumatic.     Right Ear: Tympanic membrane normal.     Left Ear: Tympanic membrane normal.     Nose: Nose normal.  Eyes:     Conjunctiva/sclera: Conjunctivae normal.     Pupils: Pupils are equal, round, and reactive to light.  Cardiovascular:     Rate and Rhythm: Regular rhythm. Tachycardia present.     Heart sounds: No murmur heard. Pulmonary:     Effort: Pulmonary effort is normal. No respiratory distress.     Breath sounds: Normal breath sounds. No wheezing or rales.  Abdominal:     General: There is no distension.     Palpations: Abdomen is soft.     Tenderness: There is no abdominal tenderness. There is no guarding or rebound.  Musculoskeletal:        General: No tenderness. Normal range of motion.     Cervical back: Normal range of motion and neck supple.  Skin:    General: Skin is warm and dry.     Findings: No erythema or rash.  Neurological:     Mental Status: He is alert and oriented to person, place, and time. Mental status is at baseline.  Psychiatric:         Mood and Affect: Mood normal.        Behavior: Behavior normal.    ED Results / Procedures / Treatments   Labs (all labs ordered are listed, but only abnormal results are displayed) Labs Reviewed - No data to  display  EKG None  Radiology DG Chest 2 View  Result Date: 11/21/2020 CLINICAL DATA:  Shortness of breath.  COVID. EXAM: CHEST - 2 VIEW COMPARISON:  Chest x-ray 01/31/2017. FINDINGS: The heart size and mediastinal contours are within normal limits. Both lungs are clear. The visualized skeletal structures are unremarkable. IMPRESSION: No active cardiopulmonary disease. Electronically Signed   By: Ronney Asters M.D.   On: 11/21/2020 15:53    Procedures Procedures   Medications Ordered in ED Medications - No data to display  ED Course  I have reviewed the triage vital signs and the nursing notes.  Pertinent labs & imaging results that were available during my care of the patient were reviewed by me and considered in my medical decision making (see chart for details).    MDM Rules/Calculators/A&P                           Pt with symptoms consistent with viral URI/COVID.  Well appearing here.  No signs of breathing difficulty  No signs of pharyngitis, otitis or abnormal abdominal findings.   CXR wnl and pt does not have any wheezing here. He does have mild tachycardia up to 110 on exam but improve below 100 with rest. Pt is candidate for paxlovid as sx started 3 days ago.  Will also give prednisone if he starts having wheezing but sats >90% and pt speaking if full sentences and feel ing improved.  pt to return with any further problems.  MDM   Amount and/or Complexity of Data Reviewed Tests in the radiology section of CPT: ordered and reviewed Independent visualization of images, tracings, or specimens: yes     Final Clinical Impression(s) / ED Diagnoses Final diagnoses:  COVID    Rx / DC Orders ED Discharge Orders          Ordered     nirmatrelvir/ritonavir EUA (PAXLOVID) 20 x 150 MG & 10 x 100MG  TABS  2 times daily        11/21/20 2105    predniSONE (DELTASONE) 20 MG tablet  Daily        11/21/20 2105             Blanchie Dessert, MD 11/21/20 2110

## 2020-11-21 NOTE — Discharge Instructions (Addendum)
Return to the emergency room if you start having severe chest pain, inability to catch her breath, high fever or vomiting

## 2020-11-21 NOTE — ED Triage Notes (Signed)
The pt ha shad a cxough since Tuesday  cough and drainage from the nose cough increased  yesterday he took a home covid test that was pos  no temp no other  symptoms

## 2020-11-21 NOTE — ED Provider Notes (Signed)
Emergency Medicine Provider Triage Evaluation Note  Travis Hicks , a 70 y.o. male  was evaluated in triage.  Pt complains of cough and shortness of breath over the last week.  He states he has been constant worsening especially up until Friday however he has been feeling well over the last couple of days.  Shortness of breath is improved.  His nurse instructed him to come to the emergency department for evaluation since he has COPD.  No chest pain, fever, chills, abdominal pain, nausea, vomiting, diarrhea.  Review of Systems  Positive:  Negative: See above  Physical Exam  BP (!) 158/113 (BP Location: Left Arm)   Pulse (!) 113   Temp 98.2 F (36.8 C) (Oral)   Resp 16   Ht 6' (1.829 m)   Wt 74.7 kg   SpO2 96%   BMI 22.34 kg/m  Gen:   Awake, no distress   Resp:  Normal effort  MSK:   Moves extremities without difficulty  Other:  Clear to auscultation bilaterally  Medical Decision Making  Medically screening exam initiated at 3:26 PM.  Appropriate orders placed.  [name] was informed that the remainder of the evaluation will be completed by another provider, this initial triage assessment does not replace that evaluation, and the importance of remaining in the ED until their evaluation is complete.     Myna Bright [place], PA-C 11/21/20 [place], MD 11/22/20 [phone]

## 2020-11-22 ENCOUNTER — Telehealth: Payer: Self-pay | Admitting: Internal Medicine

## 2020-11-22 NOTE — Telephone Encounter (Signed)
Just fyi. See below

## 2020-11-22 NOTE — Telephone Encounter (Signed)
[facility] FYI...   Caller states he is positive COVID. Caller states he has cough, runny nose, and headache. Caller advised that sx started Thursday night. Caller tested positive yesterday. No temp. Caller has had 3 Covid vaccines.   Advised to go to ED now. Patient understood and went to [facility] ED

## 2021-01-28 ENCOUNTER — Other Ambulatory Visit: Payer: Self-pay

## 2021-01-28 ENCOUNTER — Emergency Department (HOSPITAL_COMMUNITY)
Admission: EM | Admit: 2021-01-28 | Discharge: 2021-01-28 | Disposition: A | Discharge status: Home / Self Care | Payer: Federal, State, Local not specified - PPO | Attending: Emergency Medicine | Admitting: Emergency Medicine

## 2021-01-28 ENCOUNTER — Encounter (HOSPITAL_COMMUNITY): Payer: Self-pay

## 2021-01-28 ENCOUNTER — Emergency Department (HOSPITAL_COMMUNITY): Payer: Federal, State, Local not specified - PPO

## 2021-01-28 DIAGNOSIS — F17210 Nicotine dependence, cigarettes, uncomplicated: Secondary | ICD-10-CM | POA: Diagnosis not present

## 2021-01-28 DIAGNOSIS — E876 Hypokalemia: Secondary | ICD-10-CM | POA: Insufficient documentation

## 2021-01-28 DIAGNOSIS — J441 Chronic obstructive pulmonary disease with (acute) exacerbation: Secondary | ICD-10-CM | POA: Diagnosis not present

## 2021-01-28 DIAGNOSIS — R0602 Shortness of breath: Secondary | ICD-10-CM | POA: Diagnosis present

## 2021-01-28 LAB — CBC WITH DIFFERENTIAL/PLATELET
Abs Immature Granulocytes: 0.02 10*3/uL (ref 0.00–0.07)
Basophils Absolute: 0 10*3/uL (ref 0.0–0.1)
Basophils Relative: 0 %
Eosinophils Absolute: 0 10*3/uL (ref 0.0–0.5)
Eosinophils Relative: 1 %
HCT: 45.7 % (ref 39.0–52.0)
Hemoglobin: 15.4 g/dL (ref 13.0–17.0)
Immature Granulocytes: 0 %
Lymphocytes Relative: 17 %
Lymphs Abs: 1.1 10*3/uL (ref 0.7–4.0)
MCH: 33.1 pg (ref 26.0–34.0)
MCHC: 33.7 g/dL (ref 30.0–36.0)
MCV: 98.3 fL (ref 80.0–100.0)
Monocytes Absolute: 0.5 10*3/uL (ref 0.1–1.0)
Monocytes Relative: 8 %
Neutro Abs: 4.6 10*3/uL (ref 1.7–7.7)
Neutrophils Relative %: 74 %
Platelets: 222 10*3/uL (ref 150–400)
RBC: 4.65 MIL/uL (ref 4.22–5.81)
RDW: 15.3 % (ref 11.5–15.5)
WBC: 6.2 10*3/uL (ref 4.0–10.5)
nRBC: 0 % (ref 0.0–0.2)

## 2021-01-28 LAB — BRAIN NATRIURETIC PEPTIDE: B Natriuretic Peptide: 364.8 pg/mL — ABNORMAL HIGH (ref 0.0–100.0)

## 2021-01-28 LAB — BASIC METABOLIC PANEL
Anion gap: 11 (ref 5–15)
BUN: 5 mg/dL — ABNORMAL LOW (ref 8–23)
CO2: 26 mmol/L (ref 22–32)
Calcium: 8.9 mg/dL (ref 8.9–10.3)
Chloride: 100 mmol/L (ref 98–111)
Creatinine, Ser: 0.6 mg/dL — ABNORMAL LOW (ref 0.61–1.24)
GFR, Estimated: >60 mL/min (ref >60)
Glucose, Bld: 122 mg/dL — ABNORMAL HIGH (ref 70–99)
Potassium: 2.7 mmol/L — CL (ref 3.5–5.1)
Sodium: 137 mmol/L (ref 135–145)

## 2021-01-28 LAB — TROPONIN I (HIGH SENSITIVITY): Troponin I (High Sensitivity): 12 ng/L (ref <18)

## 2021-01-28 MED ORDER — POTASSIUM CHLORIDE CRYS ER 20 MEQ PO TBCR: 20.0000 meq | EXTENDED_RELEASE_TABLET | Freq: Two times a day (BID) | ORAL | 0 refills | Status: DC

## 2021-01-28 MED ORDER — ALBUTEROL SULFATE HFA 108 (90 BASE) MCG/ACT IN AERS
2.0000 | INHALATION_SPRAY | Freq: Once | RESPIRATORY_TRACT | Status: AC
  Administered 2021-01-28: 2 via RESPIRATORY_TRACT
  Filled 2021-01-28: qty 6.7

## 2021-01-28 MED ORDER — ALBUTEROL SULFATE HFA 108 (90 BASE) MCG/ACT IN AERS
2.0000 | INHALATION_SPRAY | Freq: Four times a day (QID) | RESPIRATORY_TRACT | Status: DC | PRN
  Administered 2021-01-28: 2 via RESPIRATORY_TRACT
  Filled 2021-01-28: qty 6.7

## 2021-01-28 MED ORDER — PREDNISONE 50 MG PO TABS: 50.0000 mg | ORAL_TABLET | Freq: Every day | ORAL | 0 refills | Status: DC

## 2021-01-28 MED ORDER — POTASSIUM CHLORIDE CRYS ER 20 MEQ PO TBCR
40.0000 meq | EXTENDED_RELEASE_TABLET | Freq: Once | ORAL | Status: AC
  Administered 2021-01-28: 40 meq via ORAL
  Filled 2021-01-28: qty 2

## 2021-01-28 MED ORDER — PREDNISONE 20 MG PO TABS
60.0000 mg | ORAL_TABLET | Freq: Once | ORAL | Status: AC
  Administered 2021-01-28: 60 mg via ORAL
  Filled 2021-01-28: qty 3

## 2021-01-28 NOTE — ED Triage Notes (Signed)
Patient reports increased difficulty breathing this AM that is worse when lying down. Patient reports a history of COPD. Patient does not use O2 at home.

## 2021-01-28 NOTE — ED Notes (Signed)
Potassium 2.7. Received from lab.

## 2021-01-28 NOTE — ED Provider Notes (Signed)
Emergency Medicine Provider Triage Evaluation Note  Travis Hicks , a 70 y.o. male  was evaluated in triage.  Pt complains of shortness of breath.  Worse when he exerts himself as well as night when he lays flat.  No lower extremity edema.  No PND.  No history of CHF.  Does have history of COPD, last use of inhaler yesterday.  Shortness of breath worsened earlier today.  He has mild nonproductive chronic cough.  No recent sick contacts.  No pain.  No history of PE, DVT.  He is not anticoagulated.  Has some tightness across his chest however denies any overt pain.  Review of Systems  Positive: Shortness of breath, chest tightness Negative: PND, lower extremity swelling  Physical Exam  BP (!) 184/117 (BP Location: Right Arm)   Pulse 84   Temp 98.2 F (36.8 C) (Oral)   Resp (!) 24   Ht 6\' 1"  (1.854 m)   Wt 73.5 kg   SpO2 93%   BMI 21.37 kg/m  Gen:   Awake, no distress   Resp:  Normal effort, mild expiratory wheeze however overall decreased lung sounds Bl MSK:   Moves extremities without difficulty  Other:    Medical Decision Making  Medically screening exam initiated at 9:24 AM.  Appropriate orders placed.  [name] was informed that the remainder of the evaluation will be completed by another provider, this initial triage assessment does not replace that evaluation, and the importance of remaining in the ED until their evaluation is complete.  SOB  No respiratory distress.  No hypoxia.  Will give inhaler, labs and reassess   Shooter Tangen A, PA-C 01/28/21 1093    Charlesetta Shanks, MD 01/28/21 1031

## 2021-01-28 NOTE — Discharge Instructions (Signed)
Take the steroids until they are finished.  Use the inhaler to help with any wheezing episodes.  Follow-up with your primary care doctor next week to be rechecked.  Return as needed for worsening symptoms

## 2021-01-28 NOTE — ED Provider Notes (Signed)
[facility] DEPT Provider Note   CSN: [id number] Arrival date & time: 01/28/21  7106     History Chief Complaint  Patient presents with   COPD    Travis Hicks is a 70 y.o. male.   COPD   Patient presents ED for evaluation of shortness of breath.  He does have a history of COPD and smokes but is not chronically on oxygen.  Patient states this morning when he woke up he felt very short of breath.  It was worse when he was lying flat.  He is not having any chest pain.  He denies trouble with leg swelling.  He has not had any fevers or chills.  The symptoms were pretty severe initially thought he might need to call 911 but it started to get a little bit better and he drove to the ED.  He was given an albuterol inhaler when he first arrived.  Patient states ever since then he has been feeling much better.  While he has been waiting his symptoms have not resolved.  Past Medical History:  Diagnosis Date   Cataract    veru small per pt .   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 coli UTI (urinary tract infection) 04/29/2013   Wart viral 01/14/2013   Alcoholism ([place]) 01/14/2013   Lower abdominal pain 01/14/2013   Well adult exam 05/23/2011   SMOKER 12/07/2008   Cerumen impaction 12/02/2007   Abnormal CT of the abdomen  11/27/2006   Hematuria 11/27/2006   IMPOTENCE 11/27/2006   GERD 11/26/2006   COLONIC POLYPS, HX OF 11/26/2006    Past Surgical History:  Procedure Laterality Date   COLONOSCOPY     10-10-02,11-21-2005   DENTAL SURGERY     had 14 teeth pulled    HAND SURGERY Right    POLYPECTOMY     10-10-02,11-21-05       Family History  Problem Relation Age of Onset   Pancreatic cancer Father    Glaucoma Brother    Colon cancer Neg Hx    Rectal cancer Neg Hx    Stomach cancer Neg Hx    Esophageal cancer Neg Hx    Prostate cancer Neg Hx    Colon polyps Neg Hx     Social History   Tobacco Use   Smoking status: Every Day    Packs/day: 1.00    Types: Cigarettes   Smokeless tobacco: Never   Tobacco comments:    tobacco info given  Vaping Use   Vaping Use: Never used  Substance Use Topics   Alcohol use: Not Currently   Drug use: No    Home Medications Prior to Admission medications   Medication Sig Start Date End Date Taking? Authorizing Provider  potassium chloride SA (KLOR-CON M) 20 MEQ tablet Take 1 tablet (20 mEq total) by  mouth 2 (two) times daily for 7 days. 01/28/21 02/04/21 Yes Dorie Rank, MD  predniSONE (DELTASONE) 50 MG tablet Take 1 tablet (50 mg total) by mouth daily. 01/28/21  Yes Dorie Rank, MD  amoxicillin-clavulanate (AUGMENTIN) 875-125 MG tablet Take 1 tablet by mouth 2 (two) times daily. 11/09/20   Plotnikov, Evie Lacks, MD  ANORO ELLIPTA 62.5-25 MCG/INH AEPB INHALE 1 PUFF BY MOUTH EVERY DAY 02/17/20   Plotnikov, Evie Lacks, MD  b complex vitamins tablet Take 1 tablet by mouth daily. 01/14/13   Plotnikov, Evie Lacks, MD  Cholecalciferol (VITAMIN D3) 50 MCG (2000 UT) capsule Take 2 capsules (4,000 Units total) by mouth daily. 03/11/20   Plotnikov, Evie Lacks, MD  latanoprost (XALATAN) 0.005 % ophthalmic solution  12/01/16   [provider]  triamcinolone ointment (KENALOG) 0.5 % Apply 1 application topically 3 (three) times daily. 11/09/20 11/09/21  Plotnikov, Evie Lacks, MD    Allergies    Viagra [sildenafil citrate]  Review of Systems   Review of Systems  All other systems reviewed and are negative.  Physical Exam Updated Vital Signs BP (!) 158/102 (BP Location: Right Arm)   Pulse 95   Temp 98.5 F (36.9 C) (Oral)   Resp 18   Ht 1.854 m (6\' 1" )   Wt 73.5 kg   SpO2 91%   BMI 21.37 kg/m   Physical Exam Vitals and nursing note reviewed.  Constitutional:      General: He is not in acute distress.    Appearance: He is well-developed.  HENT:     Head: Normocephalic and atraumatic.     Right Ear: External ear normal.     Left Ear: External ear normal.  Eyes:     General: No scleral icterus.       Right eye: No discharge.        Left eye: No discharge.     Conjunctiva/sclera: Conjunctivae normal.  Neck:     Trachea: No tracheal deviation.  Cardiovascular:     Rate and Rhythm: Normal rate and regular rhythm.  Pulmonary:     Effort: Pulmonary effort is normal. No respiratory distress.     Breath sounds: No stridor. Wheezing (Occasional mild wheeze noted) present. No rales.  Abdominal:     General: Bowel sounds are normal. There is no distension.     Palpations: Abdomen is soft.     Tenderness: There is no abdominal tenderness. There is no guarding or rebound.  Musculoskeletal:        General: No tenderness or deformity.     Cervical back: Neck supple.  Skin:    General: Skin is warm and dry.     Findings: No rash.  Neurological:     General: No focal deficit present.     Mental Status: He is alert.     Cranial Nerves: No cranial nerve deficit (no facial droop, extraocular movements intact, no slurred speech).     Sensory: No sensory deficit.     Motor: No abnormal muscle tone or seizure activity.     Coordination: Coordination normal.  Psychiatric:        Mood and Affect: Mood normal.    ED Results / Procedures / Treatments   Labs (all labs ordered are listed, but only abnormal results are displayed) Labs Reviewed  BASIC  METABOLIC PANEL - Abnormal; Notable for the following components:      Result Value   Potassium 2.7 (*)    Glucose, Bld 122 (*)  BUN 5 (*)    Creatinine, Ser 0.60 (*)    All other components within normal limits  BRAIN NATRIURETIC PEPTIDE - Abnormal; Notable for the following components:   B Natriuretic Peptide 364.8 (*)    All other components within normal limits  RESP PANEL BY RT-PCR (FLU A&B, COVID) ARPGX2  CBC WITH DIFFERENTIAL/PLATELET  TROPONIN I (HIGH SENSITIVITY)  TROPONIN I (HIGH SENSITIVITY)    EKG None  Radiology DG Chest 2 View  Result Date: 01/28/2021 CLINICAL DATA:  Shortness of breath EXAM: CHEST - 2 VIEW COMPARISON:  Radiograph 11/21/2020 FINDINGS: Unchanged cardiomediastinal silhouette. There is no focal airspace disease. There is no large pleural effusion. There is no visible pneumothorax. There is no acute osseous abnormality. Thoracic spondylosis. IMPRESSION: No evidence of acute cardiopulmonary disease. Electronically Signed   By: Maurine Simmering M.D.   On: 01/28/2021 09:58    Procedures Procedures   Medications Ordered in ED Medications  albuterol (VENTOLIN HFA) 108 (90 Base) MCG/ACT inhaler 2 puff (2 puffs Inhalation Given 01/28/21 1655)  potassium chloride SA (KLOR-CON M) CR tablet 40 mEq (has no administration in time range)  albuterol (VENTOLIN HFA) 108 (90 Base) MCG/ACT inhaler 2 puff (2 puffs Inhalation Given 01/28/21 0934)  predniSONE (DELTASONE) tablet 60 mg (60 mg Oral Given 01/28/21 1655)    ED Course  I have reviewed the triage vital signs and the nursing notes.  Pertinent labs & imaging results that were available during my care of the patient were reviewed by me and considered in my medical decision making (see chart for details).  Clinical Course as of 01/28/21 1706  Fri Jan 28, 2021  1705 BNP slightly elevated 04/[birth year]/1962.  Troponin normal [JK]  1705 Electrolyte panel notable for potassium of 2.7. [JK]  1705 CBC normal. [JK]  1705 Chest x-ray  without acute findings [JK]    Clinical Course User Index [JK] Dorie Rank, MD   MDM Rules/Calculators/A&P                           Patient presented with shortness of breath with history of COPD.  Noted to have very slight wheeze on my exam.  Patient unfortunately has waited several hours prior to evaluation.  Fortunately symptoms have resolved and he is feeling well.  His laboratory test are notable for potassium level of 2.7.  I suspect the breathing treatment he was given contributed to his hypokalemia.  We will give a dose of oral potassium here but this should correct on its own.  Will discharge home with prescription for prednisone as well as an albuterol inhaler.  Presentation consistent with COPD exacerbation.  Doubt ACS pneumonia PE Final Clinical Impression(s) / ED Diagnoses Final diagnoses:  COPD exacerbation ([place])  Hypokalemia    Rx / DC Orders ED Discharge Orders          Ordered    predniSONE (DELTASONE) 50 MG tablet  Daily        01/28/21 1702    potassium chloride SA (KLOR-CON M) 20 MEQ tablet  2 times daily        01/28/21 1702             Dorie Rank, MD 01/28/21 1706

## 2021-02-02 ENCOUNTER — Other Ambulatory Visit: Payer: Self-pay

## 2021-02-02 ENCOUNTER — Encounter: Payer: Self-pay | Admitting: Internal Medicine

## 2021-02-02 ENCOUNTER — Ambulatory Visit: Payer: Federal, State, Local not specified - PPO | Admitting: Internal Medicine

## 2021-02-02 VITALS — BP 142/90 | HR 87 | Temp 98.3°F | Ht 72.0 in | Wt 162.0 lb

## 2021-02-02 DIAGNOSIS — J441 Chronic obstructive pulmonary disease with (acute) exacerbation: Secondary | ICD-10-CM | POA: Diagnosis not present

## 2021-02-02 DIAGNOSIS — E876 Hypokalemia: Secondary | ICD-10-CM | POA: Diagnosis not present

## 2021-02-02 DIAGNOSIS — R030 Elevated blood-pressure reading, without diagnosis of hypertension: Secondary | ICD-10-CM

## 2021-02-02 DIAGNOSIS — E43 Unspecified severe protein-calorie malnutrition: Secondary | ICD-10-CM

## 2021-02-02 DIAGNOSIS — R55 Syncope and collapse: Secondary | ICD-10-CM

## 2021-02-02 DIAGNOSIS — Z8616 Personal history of COVID-19: Secondary | ICD-10-CM

## 2021-02-02 MED ORDER — ANORO ELLIPTA 62.5-25 MCG/ACT IN AEPB: 1.0000 | INHALATION_SPRAY | Freq: Every day | RESPIRATORY_TRACT | 11 refills | Status: DC

## 2021-02-02 MED ORDER — POTASSIUM CHLORIDE CRYS ER 20 MEQ PO TBCR: 20.0000 meq | EXTENDED_RELEASE_TABLET | Freq: Two times a day (BID) | ORAL | 3 refills | Status: DC

## 2021-02-02 NOTE — Progress Notes (Signed)
Subjective:  Patient ID: Travis Hicks, male    DOB: January 20, 1951  Age: 70 y.o. MRN: [id number]  CC: Follow-up (3 month f/u)    HPI Travis Hicks presents for COVID in Oct 2022, reccent syncope and COPD In Oct - coughing spell - passed out, low K C/o COPD flare up  - 1 wk ago   Per ER - 12/09: "Patient presents ED for evaluation of shortness of breath.  He does have a history of COPD and smokes but is not chronically on oxygen.  Patient states this morning when he woke up he felt very short of breath.  It was worse when he was lying flat.  He is not having any chest pain.  He denies trouble with leg swelling.  He has not had any fevers or chills.  The symptoms were pretty severe initially thought he might need to call 911 but it started to get a little bit better and he drove to the ED.  He was given an albuterol inhaler when he first arrived.  Patient states ever since then he has been feeling much better.  While he has been waiting his symptoms have not resolved.   Patient presented with shortness of breath with history of COPD.  Noted to have very slight wheeze on my exam.  Patient unfortunately has waited several hours prior to evaluation.  Fortunately symptoms have resolved and he is feeling well.  His laboratory test are notable for potassium level of 2.7.  I suspect the breathing treatment he was given contributed to his hypokalemia.  We will give a dose of oral potassium here but this should correct on its own.  Will discharge home with prescription for prednisone as well as an albuterol inhaler.  Presentation consistent with COPD exacerbation.  Doubt ACS pneumonia PE"  Outpatient Medications Prior to Visit  Medication Sig Dispense Refill   b complex vitamins tablet Take 1 tablet by mouth daily. 100 tablet 3   Cholecalciferol (VITAMIN D3) 50 MCG (2000 UT) capsule Take 2 capsules (4,000 Units total) by mouth daily. 100 capsule 3   latanoprost (XALATAN) 0.005 % ophthalmic solution       predniSONE (DELTASONE) 50 MG tablet Take 1 tablet (50 mg total) by mouth daily. 5 tablet 0   triamcinolone ointment (KENALOG) 0.5 % Apply 1 application topically 3 (three) times daily. 120 g 1   ANORO ELLIPTA 62.5-25 MCG/INH AEPB INHALE 1 PUFF BY MOUTH EVERY DAY 60 each 11   potassium chloride SA (KLOR-CON M) 20 MEQ tablet Take 1 tablet (20 mEq total) by mouth 2 (two) times daily for 7 days. 14 tablet 0   amoxicillin-clavulanate (AUGMENTIN) 875-125 MG tablet Take 1 tablet by mouth 2 (two) times daily. (Patient not taking: Reported on 02/02/2021) 20 tablet 0   No facility-administered medications prior to visit.    ROS: Review of Systems  Constitutional:  Positive for fatigue. Negative for appetite change and unexpected weight change.  HENT:  Negative for congestion, nosebleeds, sneezing, sore throat and trouble swallowing.   Eyes:  Negative for itching and visual disturbance.  Respiratory:  Negative for cough.   Cardiovascular:  Negative for chest pain, palpitations and leg swelling.  Gastrointestinal:  Negative for abdominal distention, blood in stool, diarrhea and nausea.  Genitourinary:  Negative for frequency and hematuria.  Musculoskeletal:  Negative for back pain, gait problem, joint swelling and neck pain.  Skin:  Negative for rash.  Neurological:  Negative for dizziness, tremors, speech difficulty and weakness.  Psychiatric/Behavioral:  Negative for agitation, dysphoric mood and sleep disturbance. The patient is not nervous/anxious.    Objective:  BP (!) 142/90 (BP Location: Left Arm)    Pulse 87    Temp 98.3 F (36.8 C) (Oral)    Ht 6' (1.829 m)    Wt 162 lb (73.5 kg)    SpO2 95%    BMI 21.97 kg/m   BP Readings from Last 3 Encounters:  02/02/21 (!) 142/90  01/28/21 (!) 158/102  11/21/20 (!) 153/86    Wt Readings from Last 3 Encounters:  02/02/21 162 lb (73.5 kg)  01/28/21 162 lb (73.5 kg)  11/21/20 164 lb 10.9 oz (74.7 kg)    Physical Exam Constitutional:       General: He is not in acute distress.    Appearance: He is well-developed. He is not ill-appearing.     Comments: NAD  Eyes:     Conjunctiva/sclera: Conjunctivae normal.     Pupils: Pupils are equal, round, and reactive to light.  Neck:     Thyroid: No thyromegaly.     Vascular: No JVD.  Cardiovascular:     Rate and Rhythm: Normal rate and regular rhythm.     Heart sounds: Normal heart sounds. No murmur heard.   No friction rub. No gallop.  Pulmonary:     Effort: Pulmonary effort is normal. No respiratory distress.     Breath sounds: Normal breath sounds. No wheezing or rales.  Chest:     Chest wall: No tenderness.  Abdominal:     General: Bowel sounds are normal. There is no distension.     Palpations: Abdomen is soft. There is no mass.     Tenderness: There is no abdominal tenderness. There is no guarding or rebound.  Musculoskeletal:        General: No tenderness. Normal range of motion.     Cervical back: Normal range of motion.  Lymphadenopathy:     Cervical: No cervical adenopathy.  Skin:    General: Skin is warm and dry.     Findings: No rash.  Neurological:     Mental Status: He is alert and oriented to person, place, and time.     Cranial Nerves: No cranial nerve deficit.     Motor: No abnormal muscle tone.     Coordination: Coordination normal.     Gait: Gait normal.     Deep Tendon Reflexes: Reflexes are normal and symmetric.  Psychiatric:        Behavior: Behavior normal.        Thought Content: Thought content normal.        Judgment: Judgment normal.    Lab Results  Component Value Date   WBC 6.2 01/28/2021   HGB 15.4 01/28/2021   HCT 45.7 01/28/2021   PLT 222 01/28/2021   GLUCOSE 122 (H) 01/28/2021   CHOL 140 03/05/2020   TRIG 166 (H) 03/05/2020   HDL 73 03/05/2020   LDLCALC 43 03/05/2020   ALT 16 07/09/2020   AST 15 07/09/2020   NA 137 01/28/2021   K 2.7 (LL) 01/28/2021   CL 100 01/28/2021   CREATININE 0.60 (L) 01/28/2021   BUN 5 (L)  01/28/2021   CO2 26 01/28/2021   TSH 1.34 03/05/2020   PSA 0.37 03/05/2020    DG Chest 2 View  Result Date: 01/28/2021 CLINICAL DATA:  Shortness of breath EXAM: CHEST - 2 VIEW COMPARISON:  Radiograph 11/21/2020 FINDINGS: Unchanged cardiomediastinal silhouette. There is no focal airspace disease.  There is no large pleural effusion. There is no visible pneumothorax. There is no acute osseous abnormality. Thoracic spondylosis. IMPRESSION: No evidence of acute cardiopulmonary disease. Electronically Signed   By: Maurine Simmering M.D.   On: 01/28/2021 09:58    Assessment & Plan:   Problem List Items Addressed This Visit     Blood pressure elevated without history of HTN    Elevated BP now. 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 ER notes/tests reviewed Hydrate well      Other Visit Diagnoses     Hypokalemia    -  Primary   Relevant Orders   Comprehensive metabolic panel   Magnesium         Meds ordered this encounter  Medications   umeclidinium-vilanterol (ANORO ELLIPTA) 62.5-25 MCG/ACT AEPB    Sig: Inhale 1 puff into the lungs daily.    Dispense:  1 each    Refill:  11   potassium chloride SA (KLOR-CON M) 20 MEQ tablet    Sig: Take 1 tablet (20 mEq total) by mouth 2 (two) times daily for 7 days.    Dispense:  60 tablet    Refill:  3      Follow-up: Return in about 3 months (around 05/03/2021) for Wellness Exam.  Walker Kehr, MD

## 2021-02-07 DIAGNOSIS — R55 Syncope and collapse: Secondary | ICD-10-CM | POA: Insufficient documentation

## 2021-02-07 DIAGNOSIS — J441 Chronic obstructive pulmonary disease with (acute) exacerbation: Secondary | ICD-10-CM | POA: Insufficient documentation

## 2021-02-07 DIAGNOSIS — Z8616 Personal history of COVID-19: Secondary | ICD-10-CM | POA: Insufficient documentation

## 2021-02-07 NOTE — Assessment & Plan Note (Signed)
Getting better - cont w/Anoro

## 2021-02-07 NOTE — Assessment & Plan Note (Signed)
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

## 2021-02-07 NOTE — Assessment & Plan Note (Signed)
Wt Readings from Last 3 Encounters:  02/02/21 162 lb (73.5 kg)  01/28/21 162 lb (73.5 kg)  11/21/20 164 lb 10.9 oz (74.7 kg)  Overall better Cont to eat well

## 2021-02-07 NOTE — Assessment & Plan Note (Addendum)
Post-covid - no relapse. ER notes/tests reviewed Hydrate well

## 2021-02-07 NOTE — Assessment & Plan Note (Signed)
Elevated BP now. Cont to monitor

## 2021-02-08 ENCOUNTER — Ambulatory Visit: Payer: Federal, State, Local not specified - PPO | Admitting: Internal Medicine

## 2021-03-09 ENCOUNTER — Ambulatory Visit (INDEPENDENT_AMBULATORY_CARE_PROVIDER_SITE_OTHER): Payer: Federal, State, Local not specified - PPO | Admitting: Internal Medicine

## 2021-03-09 ENCOUNTER — Encounter: Payer: Self-pay | Admitting: Internal Medicine

## 2021-03-09 ENCOUNTER — Other Ambulatory Visit: Payer: Self-pay

## 2021-03-09 VITALS — BP 132/80 | HR 99 | Temp 98.3°F | Ht 72.0 in | Wt 168.0 lb

## 2021-03-09 DIAGNOSIS — R413 Other amnesia: Secondary | ICD-10-CM | POA: Diagnosis not present

## 2021-03-09 DIAGNOSIS — J449 Chronic obstructive pulmonary disease, unspecified: Secondary | ICD-10-CM

## 2021-03-09 DIAGNOSIS — Z0000 Encounter for general adult medical examination without abnormal findings: Secondary | ICD-10-CM

## 2021-03-09 DIAGNOSIS — E876 Hypokalemia: Secondary | ICD-10-CM | POA: Diagnosis not present

## 2021-03-09 MED ORDER — DONEPEZIL HCL 5 MG PO TABS: 5.0000 mg | ORAL_TABLET | Freq: Every day | ORAL | 3 refills | Status: DC

## 2021-03-09 NOTE — Assessment & Plan Note (Addendum)
Check K and Mag Cont KCl qd  No diarrhea, n/v ?renal loss vs other

## 2021-03-09 NOTE — Progress Notes (Addendum)
Subjective:  Patient ID: Travis Hicks, male    DOB: [birth year]  Age: 71 y.o. MRN: [id number]  CC: Annual Exam   HPI Travis Hicks presents for hypokalemia, COPD, memory issues  Outpatient Medications Prior to Visit  Medication Sig Dispense Refill   b complex vitamins tablet Take 1 tablet by mouth daily. 100 tablet 3   Cholecalciferol (VITAMIN D3) 50 MCG (2000 UT) capsule Take 2 capsules (4,000 Units total) by mouth daily. 100 capsule 3   latanoprost (XALATAN) 0.005 % ophthalmic solution      umeclidinium-vilanterol (ANORO ELLIPTA) 62.5-25 MCG/ACT AEPB Inhale 1 puff into the lungs daily. 1 each 11   potassium chloride SA (KLOR-CON M) 20 MEQ tablet Take 1 tablet (20 mEq total) by mouth 2 (two) times daily for 7 days. (Patient not taking: Reported on 03/09/2021) 60 tablet 3   predniSONE (DELTASONE) 50 MG tablet Take 1 tablet (50 mg total) by mouth daily. (Patient not taking: Reported on 03/09/2021) 5 tablet 0   triamcinolone ointment (KENALOG) 0.5 % Apply 1 application topically 3 (three) times daily. (Patient not taking: Reported on 03/09/2021) 120 g 1   No facility-administered medications prior to visit.    ROS: Review of Systems  Constitutional:  Negative for appetite change, fatigue and unexpected weight change.  HENT:  Negative for congestion, nosebleeds, sneezing, sore throat and trouble swallowing.   Eyes:  Negative for itching and visual disturbance.  Respiratory:  Positive for shortness of breath. Negative for cough.   Cardiovascular:  Negative for chest pain, palpitations and leg swelling.  Gastrointestinal:  Negative for abdominal distention, abdominal pain, blood in stool, diarrhea, nausea and vomiting.  Genitourinary:  Negative for frequency and hematuria.  Musculoskeletal:  Negative for back pain, gait problem, joint swelling and neck pain.  Skin:  Negative for rash.  Neurological:  Negative for dizziness, tremors, speech difficulty and weakness.  Psychiatric/Behavioral:   Negative for agitation, dysphoric mood and sleep disturbance. The patient is not nervous/anxious.    Objective:  BP 132/80 (BP Location: Left Arm, Patient Position: Sitting, Cuff Size: Normal)    Pulse 99    Temp 98.3 F (36.8 C) (Oral)    Ht 6' (1.829 m)    Wt 168 lb (76.2 kg)    SpO2 99%    BMI 22.78 kg/m   BP Readings from Last 3 Encounters:  03/09/21 132/80  02/02/21 (!) 142/90  01/28/21 (!) 158/102    Wt Readings from Last 3 Encounters:  03/09/21 168 lb (76.2 kg)  02/02/21 162 lb (73.5 kg)  01/28/21 162 lb (73.5 kg)    Physical Exam Constitutional:      General: He is not in acute distress.    Appearance: He is well-developed.     Comments: NAD  Eyes:     Conjunctiva/sclera: Conjunctivae normal.     Pupils: Pupils are equal, round, and reactive to light.  Neck:     Thyroid: No thyromegaly.     Vascular: No JVD.  Cardiovascular:     Rate and Rhythm: Normal rate and regular rhythm.     Heart sounds: Normal heart sounds. No murmur heard.   No friction rub. No gallop.  Pulmonary:     Effort: Pulmonary effort is normal. No respiratory distress.     Breath sounds: Normal breath sounds. No wheezing or rales.  Chest:     Chest wall: No tenderness.  Abdominal:     General: Bowel sounds are normal. There is no distension.  Palpations: Abdomen is soft. There is no mass.     Tenderness: There is no abdominal tenderness. There is no guarding or rebound.  Musculoskeletal:        General: No tenderness. Normal range of motion.     Cervical back: Normal range of motion.  Lymphadenopathy:     Cervical: No cervical adenopathy.  Skin:    General: Skin is warm and dry.     Findings: No rash.  Neurological:     Mental Status: He is alert and oriented to person, place, and time.     Cranial Nerves: No cranial nerve deficit.     Motor: No abnormal muscle tone.     Coordination: Coordination normal.     Gait: Gait normal.     Deep Tendon Reflexes: Reflexes are normal and  symmetric.  Psychiatric:        Behavior: Behavior normal.        Thought Content: Thought content normal.        Judgment: Judgment normal.    Lab Results  Component Value Date   WBC 6.2 01/28/2021   HGB 15.4 01/28/2021   HCT 45.7 01/28/2021   PLT 222 01/28/2021   GLUCOSE 122 (H) 01/28/2021   CHOL 140 03/05/2020   TRIG 166 (H) 03/05/2020   HDL 73 03/05/2020   LDLCALC 43 03/05/2020   ALT 16 07/09/2020   AST 15 07/09/2020   NA 137 01/28/2021   K 2.7 (LL) 01/28/2021   CL 100 01/28/2021   CREATININE 0.60 (L) 01/28/2021   BUN 5 (L) 01/28/2021   CO2 26 01/28/2021   TSH 1.34 03/05/2020   PSA 0.37 03/05/2020    DG Chest 2 View  Result Date: 01/28/2021 CLINICAL DATA:  Shortness of breath EXAM: CHEST - 2 VIEW COMPARISON:  Radiograph 11/21/2020 FINDINGS: Unchanged cardiomediastinal silhouette. There is no focal airspace disease. There is no large pleural effusion. There is no visible pneumothorax. There is no acute osseous abnormality. Thoracic spondylosis. IMPRESSION: No evidence of acute cardiopulmonary disease. Electronically Signed   By: Maurine Simmering M.D.   On: 01/28/2021 09:58    Assessment & Plan:   Problem List Items Addressed This Visit     COPD mixed type ([place])    Smoker 3/4 PPD - discussed Cont Anoro inhaler      Hypokalemia    Check K and Mag Cont KCl qd  No diarrhea, n/v ?renal loss vs other      Relevant Orders   Potassium, urine, random (Completed)   Urinalysis (Completed)   Memory loss    Worse Start Aricept 5 mg at hs      Well adult exam - Primary   Relevant Orders   TSH   Urinalysis   CBC with Differential/Platelet   Lipid panel   PSA   Comprehensive metabolic panel      Meds ordered this encounter  Medications   donepezil (ARICEPT) 5 MG tablet    Sig: Take 1 tablet (5 mg total) by mouth at bedtime.    Dispense:  90 tablet    Refill:  3      Follow-up: Return in about 3 months (around 06/07/2021) for Wellness Exam.  Walker Kehr, MD

## 2021-03-09 NOTE — Assessment & Plan Note (Signed)
Smoker 3/4 PPD - discussed Cont Anoro inhaler

## 2021-03-09 NOTE — Assessment & Plan Note (Signed)
Worse Start Aricept 5 mg at hs

## 2021-03-10 ENCOUNTER — Encounter: Payer: Self-pay | Admitting: Internal Medicine

## 2021-03-10 LAB — URINALYSIS
Bilirubin Urine: NEGATIVE
Ketones, ur: NEGATIVE
Leukocytes,Ua: NEGATIVE
Nitrite: NEGATIVE
Specific Gravity, Urine: >=1.03 — AB (ref 1.000–1.030)
Total Protein, Urine: NEGATIVE
Urine Glucose: NEGATIVE
Urobilinogen, UA: 0.2 (ref 0.0–1.0)
pH: 5.5 (ref 5.0–8.0)

## 2021-03-10 LAB — POTASSIUM, URINE, RANDOM: Potassium Urine: 65 mmol/L (ref 12–129)

## 2021-03-12 NOTE — Addendum Note (Signed)
Addended by: Cassandria Anger on: 03/12/2021 09:26 AM   Modules accepted: Orders

## 2021-07-07 ENCOUNTER — Telehealth: Payer: Self-pay | Admitting: Internal Medicine

## 2021-07-07 NOTE — Telephone Encounter (Signed)
Pt called in and states he thinks he has shingles. Requesting a call back to discuss what he should do.   States he does not have unbearable ,pain but would like to speak with a nurse.

## 2021-07-08 ENCOUNTER — Encounter: Payer: Self-pay | Admitting: Family Medicine

## 2021-07-08 ENCOUNTER — Ambulatory Visit: Payer: Federal, State, Local not specified - PPO | Admitting: Family Medicine

## 2021-07-08 VITALS — BP 140/90 | HR 92 | Temp 97.8°F | Ht 72.0 in | Wt 159.0 lb

## 2021-07-08 DIAGNOSIS — B029 Zoster without complications: Secondary | ICD-10-CM

## 2021-07-08 DIAGNOSIS — R21 Rash and other nonspecific skin eruption: Secondary | ICD-10-CM

## 2021-07-08 MED ORDER — ACYCLOVIR 800 MG PO TABS: 800.0000 mg | ORAL_TABLET | Freq: Every day | ORAL | 0 refills | Status: AC

## 2021-07-08 NOTE — Patient Instructions (Signed)
Take the medication 5 times daily as prescribed.   You tylenol as needed.   Follow up if you are getting worse.    Shingles  Shingles is an infection. It gives you a painful skin rash and blisters that have fluid in them. Shingles is caused by the same germ (virus) that causes chickenpox. Shingles only happens in people who: Have had chickenpox. Have been given a shot (vaccine) to protect against chickenpox. Shingles is rare in this group. What are the causes? This condition is caused by varicella-zoster virus. This is the same germ that causes chickenpox. After a person is exposed to the germ, the germ stays in the body but is not active (dormant). Shingles develops if the germ becomes active again (is reactivated). This can happen many years after the first exposure to the germ. It is not known what causes this germ to become active again. What increases the risk? People who have had chickenpox or received the chickenpox shot are at risk for shingles. This infection is more common in people who: Are older than 71 years of age. Have a weakened disease-fighting system (immune system), such as people with: HIV (human immunodeficiency virus). AIDS (acquired immunodeficiency syndrome). Cancer. Are taking medicines that weaken the immune system, such as organ transplant medicines. Have a lot of stress. What are the signs or symptoms? The first symptoms of shingles may be itching, tingling, or pain in an area on your skin. A rash will show on your skin a few days or weeks later. This is what usually happens: The rash is likely to be on one side of your body. The rash usually has a shape like a belt or a band. Over time, the rash turns into fluid-filled blisters. The blisters will break open and change into scabs. The scabs usually dry up in about 2-3 weeks. You may also have: A fever. Chills. A headache. A feeling like you may vomit (nausea). How is this treated? The rash may last  for several weeks. There is not a specific cure for this condition. Your doctor may prescribe medicines. Medicines may: Help with pain. Help you get better sooner. Help to prevent long-term problems. Help with itching (antihistamines). If the area involved is on your face, you may need to see a specialist. This may be an eye doctor or an ear, nose, and throat (ENT) doctor. Follow these instructions at home: Medicines Take over-the-counter and prescription medicines only as told by your doctor. Put on an anti-itch cream or numbing cream where you have a rash, blisters, or scabs. Do this as told by your doctor. Helping with itching and discomfort  Put cold, wet cloths (cold compresses) on the area of the rash or blisters as told by your doctor. Cool baths can help you feel better. Try adding baking soda or dry oatmeal to the water to lessen itching. Do not bathe in hot water. Use calamine lotion as told by your doctor. Blister and rash care Keep your rash covered with a loose bandage (dressing). Wear loose clothing that does not rub on your rash. Wash your hands with soap and water for at least 20 seconds before and after you change your bandage. If you cannot use soap and water, use hand sanitizer. Change your bandage as told by your doctor. Keep your rash and blisters clean. To do this, wash the area with mild soap and cool water as told by your doctor. Check your rash every day for signs of infection. Check for:  More redness, swelling, or pain. Fluid or blood. Warmth. Pus or a bad smell. Do not scratch your rash. Do not pick at your blisters. To help you to not scratch: Keep your fingernails clean and cut short. Wear gloves or mittens when you sleep, if scratching is a problem. General instructions Rest as told by your doctor. Wash your hands often with soap and water for at least 20 seconds. If you cannot use soap and water, use hand sanitizer. Doing this lowers your chance of  getting a skin infection. Your infection can cause chickenpox in people who have never had chickenpox or never got a chickenpox vaccine shot. If you have blisters that did not change into scabs yet, try not to touch other people or be around other people, especially: Babies. Pregnant women. Children who have areas of red, itchy, or rough skin (eczema). Older people who have organ transplants. People who have a long-term (chronic) illness, like cancer or AIDS. Keep all follow-up visits. How is this prevented? A vaccine shot is the best way to prevent shingles and protect against shingles problems. If you have not had a vaccine shot, talk with your doctor about getting it. Where to find more information Centers for Disease Control and Prevention: http://www.wolf.info/ Contact a doctor if: Your pain does not get better with medicine. Your pain does not get better after the rash heals. You have any of these signs of infection around the rash: More redness, swelling, or pain. Fluid or blood. Warmth. Pus or a bad smell. You have a fever. Get help right away if: The rash is on your face or nose. You have pain in your face or pain by your eye. You lose feeling on one side of your face. You have trouble seeing. You have ear pain, or you have ringing in your ear. You have a loss of taste. Your condition gets worse. Summary Shingles gives you a painful skin rash and blisters that have fluid in them. Shingles is caused by the same germ (virus) that causes chickenpox. Keep your rash covered with a loose bandage. Wear loose clothing that does not rub on your rash. If you have blisters that did not change into scabs yet, try not to touch other people or be around people. This information is not intended to replace advice given to you by your health care provider. Make sure you discuss any questions you have with your health care provider. Document Revised: 02/02/2020 Document Reviewed:  02/02/2020 Elsevier Patient Education  [place].

## 2021-07-08 NOTE — Progress Notes (Signed)
Subjective:     Patient ID: Travis Hicks, male    DOB: 1950/07/05, 71 y.o.   MRN: [id number]  Chief Complaint  Patient presents with   Rash    Upper left chest rash first noticed about 6 days ago. Started as 1 bump then multiple formed.     HPI Patient is in today for a 5-6 day history of a painful rash on the left upper chest near his axilla. Skin sensitivity is present along the rash and beyond.  Hx of shingles  No fever, chills, arthralgias or myalgias. No rash otherwise.   Health Maintenance Due  Topic Date Due   Pneumonia Vaccine 28+ Years old (1 - PCV) Never done   Zoster Vaccines- Shingrix (1 of 2) Never done   TETANUS/TDAP  05/22/2021    Past Medical History:  Diagnosis Date   Cataract    veru small per pt .   Emphysema of lung ([place])    Glaucoma 2018   Overactive bladder     Past Surgical History:  Procedure Laterality Date   COLONOSCOPY     10-10-02,11-21-2005   DENTAL SURGERY     had 14 teeth pulled    HAND SURGERY Right    POLYPECTOMY     10-10-02,11-21-05    Family History  Problem Relation Age of Onset   Pancreatic cancer Father    Glaucoma Brother    Colon cancer Neg Hx    Rectal cancer Neg Hx    Stomach cancer Neg Hx    Esophageal cancer Neg Hx    Prostate cancer Neg Hx    Colon polyps Neg Hx     Social History   Socioeconomic History   Marital status: Married    Spouse name: Not on file   Number of children: 3   Years of education: Not on file   Highest education level: Not on file  Occupational History   Occupation: retired  Tobacco Use   Smoking status: Every Day    Packs/day: 1.00    Types: Cigarettes   Smokeless tobacco: Never   Tobacco comments:    tobacco info given  Vaping Use   Vaping Use: Never used  Substance and Sexual Activity   Alcohol use: Not Currently   Drug use: No   Sexual activity: Yes  Other Topics Concern   Not on file  Social History Narrative   Not on file   Social Determinants of Health    Financial Resource Strain: Not on file  Food Insecurity: Not on file  Transportation Needs: Not on file  Physical Activity: Not on file  Stress: Not on file  Social Connections: Not on file  Intimate Partner Violence: Not on file    Outpatient Medications Prior to Visit  Medication Sig Dispense Refill   b complex vitamins tablet Take 1 tablet by mouth daily. 100 tablet 3   Cholecalciferol (VITAMIN D3) 50 MCG (2000 UT) capsule Take 2 capsules (4,000 Units total) by mouth daily. 100 capsule 3   donepezil (ARICEPT) 5 MG tablet Take 1 tablet (5 mg total) by mouth at bedtime. 90 tablet 3   latanoprost (XALATAN) 0.005 % ophthalmic solution      triamcinolone ointment (KENALOG) 0.5 % Apply 1 application topically 3 (three) times daily. 120 g 1   umeclidinium-vilanterol (ANORO ELLIPTA) 62.5-25 MCG/ACT AEPB Inhale 1 puff into the lungs daily. 1 each 11   predniSONE (DELTASONE) 50 MG tablet Take 1 tablet (50 mg total) by mouth daily. 5  tablet 0   potassium chloride SA (KLOR-CON M) 20 MEQ tablet Take 1 tablet (20 mEq total) by mouth 2 (two) times daily for 7 days. (Patient not taking: Reported on 03/09/2021) 60 tablet 3   No facility-administered medications prior to visit.    Allergies  Allergen Reactions   Viagra [Sildenafil Citrate]     ROS Pertinent positives and negatives in the history of present illness.     Objective:    Physical Exam  BP 140/90 (BP Location: Right Arm, Patient Position: Sitting, Cuff Size: Large)   Pulse 92   Temp 97.8 F (36.6 C) (Temporal)   Ht 6' (1.829 m)   Wt 159 lb (72.1 kg)   SpO2 97%   BMI 21.56 kg/m  Wt Readings from Last 3 Encounters:  07/08/21 159 lb (72.1 kg)  03/09/21 168 lb (76.2 kg)  02/02/21 162 lb (73.5 kg)   3 raised, red, tender papules that have scabs. Hyperesthesia around rash and along dermatome of left upper chest wall     Assessment & Plan:   Problem List Items Addressed This Visit       Other   Shingles - Primary    Relevant Medications   acyclovir (ZOVIRAX) 800 MG tablet   Other Visit Diagnoses     Rash          He is 5 or 6 days from onset of painful rash which is most likely herpes zoster.  I will treat him with acyclovir.  He is not currently needing pain medication but may take Tylenol if needed.  Follow-up if worsening and he will let me know if any signs of infection are present.   I have discontinued Conway Sowles's predniSONE. I am also having him start on acyclovir. Additionally, I am having him maintain his b complex vitamins, latanoprost, Vitamin D3, triamcinolone ointment, Anoro Ellipta, potassium chloride SA, and donepezil.  Meds ordered this encounter  Medications   acyclovir (ZOVIRAX) 800 MG tablet    Sig: Take 1 tablet (800 mg total) by mouth 5 (five) times daily for 7 days.    Dispense:  35 tablet    Refill:  0    Order Specific Question:   Supervising Provider    Answer:   Pricilla Holm A [[id number]]

## 2021-07-11 NOTE — Telephone Encounter (Signed)
Pls see any provider ASAP Thx

## 2021-09-26 ENCOUNTER — Emergency Department (HOSPITAL_COMMUNITY): Payer: Medicare Other

## 2021-09-26 ENCOUNTER — Encounter: Payer: Self-pay | Admitting: Internal Medicine

## 2021-09-26 ENCOUNTER — Ambulatory Visit: Payer: Federal, State, Local not specified - PPO | Admitting: Internal Medicine

## 2021-09-26 ENCOUNTER — Inpatient Hospital Stay (HOSPITAL_COMMUNITY)
Admission: EM | Admit: 2021-09-26 | Discharge: 2021-10-01 | DRG: 871 | Disposition: A | Discharge status: Home / Self Care | Payer: Medicare Other | Attending: Internal Medicine | Admitting: Internal Medicine

## 2021-09-26 ENCOUNTER — Other Ambulatory Visit: Payer: Self-pay

## 2021-09-26 VITALS — BP 138/80 | HR 83 | Temp 98.6°F | Ht 72.0 in | Wt 147.2 lb

## 2021-09-26 DIAGNOSIS — R0602 Shortness of breath: Secondary | ICD-10-CM | POA: Diagnosis present

## 2021-09-26 DIAGNOSIS — Z79899 Other long term (current) drug therapy: Secondary | ICD-10-CM | POA: Diagnosis not present

## 2021-09-26 DIAGNOSIS — F0390 Unspecified dementia without behavioral disturbance: Secondary | ICD-10-CM | POA: Diagnosis present

## 2021-09-26 DIAGNOSIS — R319 Hematuria, unspecified: Secondary | ICD-10-CM | POA: Diagnosis present

## 2021-09-26 DIAGNOSIS — E46 Unspecified protein-calorie malnutrition: Secondary | ICD-10-CM | POA: Diagnosis present

## 2021-09-26 DIAGNOSIS — Z888 Allergy status to other drugs, medicaments and biological substances status: Secondary | ICD-10-CM | POA: Diagnosis not present

## 2021-09-26 DIAGNOSIS — J441 Chronic obstructive pulmonary disease with (acute) exacerbation: Secondary | ICD-10-CM | POA: Diagnosis present

## 2021-09-26 DIAGNOSIS — Z681 Body mass index (BMI) 19 or less, adult: Secondary | ICD-10-CM

## 2021-09-26 DIAGNOSIS — J439 Emphysema, unspecified: Secondary | ICD-10-CM | POA: Diagnosis present

## 2021-09-26 DIAGNOSIS — J189 Pneumonia, unspecified organism: Secondary | ICD-10-CM | POA: Diagnosis present

## 2021-09-26 DIAGNOSIS — F17210 Nicotine dependence, cigarettes, uncomplicated: Secondary | ICD-10-CM | POA: Diagnosis present

## 2021-09-26 DIAGNOSIS — F1011 Alcohol abuse, in remission: Secondary | ICD-10-CM | POA: Diagnosis not present

## 2021-09-26 DIAGNOSIS — E8729 Other acidosis: Secondary | ICD-10-CM | POA: Diagnosis present

## 2021-09-26 DIAGNOSIS — E43 Unspecified severe protein-calorie malnutrition: Secondary | ICD-10-CM | POA: Diagnosis present

## 2021-09-26 DIAGNOSIS — J9601 Acute respiratory failure with hypoxia: Secondary | ICD-10-CM | POA: Diagnosis present

## 2021-09-26 DIAGNOSIS — Z20822 Contact with and (suspected) exposure to covid-19: Secondary | ICD-10-CM | POA: Diagnosis present

## 2021-09-26 DIAGNOSIS — A419 Sepsis, unspecified organism: Secondary | ICD-10-CM | POA: Diagnosis present

## 2021-09-26 DIAGNOSIS — Z720 Tobacco use: Secondary | ICD-10-CM | POA: Diagnosis not present

## 2021-09-26 DIAGNOSIS — E876 Hypokalemia: Secondary | ICD-10-CM | POA: Diagnosis present

## 2021-09-26 DIAGNOSIS — R0902 Hypoxemia: Secondary | ICD-10-CM | POA: Diagnosis not present

## 2021-09-26 DIAGNOSIS — H402290 Chronic angle-closure glaucoma, unspecified eye, stage unspecified: Secondary | ICD-10-CM

## 2021-09-26 DIAGNOSIS — F1010 Alcohol abuse, uncomplicated: Secondary | ICD-10-CM | POA: Diagnosis present

## 2021-09-26 DIAGNOSIS — K5900 Constipation, unspecified: Secondary | ICD-10-CM | POA: Diagnosis not present

## 2021-09-26 DIAGNOSIS — H409 Unspecified glaucoma: Secondary | ICD-10-CM | POA: Diagnosis present

## 2021-09-26 DIAGNOSIS — Z800 Family history of malignant neoplasm of digestive organs: Secondary | ICD-10-CM | POA: Diagnosis not present

## 2021-09-26 DIAGNOSIS — E860 Dehydration: Secondary | ICD-10-CM | POA: Diagnosis present

## 2021-09-26 DIAGNOSIS — R0603 Acute respiratory distress: Secondary | ICD-10-CM | POA: Diagnosis not present

## 2021-09-26 DIAGNOSIS — N179 Acute kidney failure, unspecified: Secondary | ICD-10-CM | POA: Diagnosis present

## 2021-09-26 DIAGNOSIS — Z83511 Family history of glaucoma: Secondary | ICD-10-CM

## 2021-09-26 DIAGNOSIS — G9340 Encephalopathy, unspecified: Secondary | ICD-10-CM

## 2021-09-26 DIAGNOSIS — N3281 Overactive bladder: Secondary | ICD-10-CM | POA: Diagnosis present

## 2021-09-26 DIAGNOSIS — R6520 Severe sepsis without septic shock: Secondary | ICD-10-CM | POA: Diagnosis present

## 2021-09-26 LAB — COMPREHENSIVE METABOLIC PANEL
ALT: 23 U/L (ref 0–44)
AST: 46 U/L — ABNORMAL HIGH (ref 15–41)
Albumin: 3.2 g/dL — ABNORMAL LOW (ref 3.5–5.0)
Alkaline Phosphatase: 96 U/L (ref 38–126)
Anion gap: 15 (ref 5–15)
BUN: 16 mg/dL (ref 8–23)
CO2: 29 mmol/L (ref 22–32)
Calcium: 8.9 mg/dL (ref 8.9–10.3)
Chloride: 96 mmol/L — ABNORMAL LOW (ref 98–111)
Creatinine, Ser: 1.38 mg/dL — ABNORMAL HIGH (ref 0.61–1.24)
GFR, Estimated: 55 mL/min — ABNORMAL LOW (ref >60)
Glucose, Bld: 199 mg/dL — ABNORMAL HIGH (ref 70–99)
Potassium: 2.9 mmol/L — ABNORMAL LOW (ref 3.5–5.1)
Sodium: 140 mmol/L (ref 135–145)
Total Bilirubin: 1.6 mg/dL — ABNORMAL HIGH (ref 0.3–1.2)
Total Protein: 7.7 g/dL (ref 6.5–8.1)

## 2021-09-26 LAB — CBC WITH DIFFERENTIAL/PLATELET
Abs Immature Granulocytes: 0.07 10*3/uL (ref 0.00–0.07)
Basophils Absolute: 0 10*3/uL (ref 0.0–0.1)
Basophils Relative: 0 %
Eosinophils Absolute: 0 10*3/uL (ref 0.0–0.5)
Eosinophils Relative: 0 %
HCT: 41.9 % (ref 39.0–52.0)
Hemoglobin: 14.2 g/dL (ref 13.0–17.0)
Immature Granulocytes: 1 %
Lymphocytes Relative: 3 %
Lymphs Abs: 0.3 10*3/uL — ABNORMAL LOW (ref 0.7–4.0)
MCH: 34.1 pg — ABNORMAL HIGH (ref 26.0–34.0)
MCHC: 33.9 g/dL (ref 30.0–36.0)
MCV: 100.5 fL — ABNORMAL HIGH (ref 80.0–100.0)
Monocytes Absolute: 1.3 10*3/uL — ABNORMAL HIGH (ref 0.1–1.0)
Monocytes Relative: 12 %
Neutro Abs: 9.2 10*3/uL — ABNORMAL HIGH (ref 1.7–7.7)
Neutrophils Relative %: 84 %
Platelets: 264 10*3/uL (ref 150–400)
RBC: 4.17 MIL/uL — ABNORMAL LOW (ref 4.22–5.81)
RDW: 14.2 % (ref 11.5–15.5)
WBC: 10.9 10*3/uL — ABNORMAL HIGH (ref 4.0–10.5)
nRBC: 0 % (ref 0.0–0.2)

## 2021-09-26 LAB — URINALYSIS, ROUTINE W REFLEX MICROSCOPIC
Bilirubin Urine: NEGATIVE
Glucose, UA: 50 mg/dL — AB
Ketones, ur: NEGATIVE mg/dL
Leukocytes,Ua: NEGATIVE
Nitrite: NEGATIVE
Protein, ur: 100 mg/dL — AB
Specific Gravity, Urine: >1.046 — ABNORMAL HIGH (ref 1.005–1.030)
pH: 5 (ref 5.0–8.0)

## 2021-09-26 LAB — PROTIME-INR
INR: 0.9 (ref 0.8–1.2)
Prothrombin Time: 12.2 seconds (ref 11.4–15.2)

## 2021-09-26 LAB — SODIUM, URINE, RANDOM: Sodium, Ur: 27 mmol/L

## 2021-09-26 LAB — HEPATIC FUNCTION PANEL
ALT: 22 U/L (ref 0–44)
AST: 47 U/L — ABNORMAL HIGH (ref 15–41)
Albumin: 3.1 g/dL — ABNORMAL LOW (ref 3.5–5.0)
Alkaline Phosphatase: 93 U/L (ref 38–126)
Bilirubin, Direct: 0.7 mg/dL — ABNORMAL HIGH (ref 0.0–0.2)
Indirect Bilirubin: 0.9 mg/dL (ref 0.3–0.9)
Total Bilirubin: 1.6 mg/dL — ABNORMAL HIGH (ref 0.3–1.2)
Total Protein: 7.8 g/dL (ref 6.5–8.1)

## 2021-09-26 LAB — PHOSPHORUS: Phosphorus: 4.7 mg/dL — ABNORMAL HIGH (ref 2.5–4.6)

## 2021-09-26 LAB — RESP PANEL BY RT-PCR (FLU A&B, COVID) ARPGX2
Influenza A by PCR: NEGATIVE
Influenza B by PCR: NEGATIVE
SARS Coronavirus 2 by RT PCR: NEGATIVE

## 2021-09-26 LAB — BLOOD GAS, VENOUS
Acid-Base Excess: 5.2 mmol/L — ABNORMAL HIGH (ref 0.0–2.0)
Bicarbonate: 32.6 mmol/L — ABNORMAL HIGH (ref 20.0–28.0)
O2 Saturation: 86.6 %
Patient temperature: 37
pCO2, Ven: 59 mmHg (ref 44–60)
pH, Ven: 7.35 (ref 7.25–7.43)
pO2, Ven: 59 mmHg — ABNORMAL HIGH (ref 32–45)

## 2021-09-26 LAB — PROCALCITONIN: Procalcitonin: 0.32 ng/mL

## 2021-09-26 LAB — MAGNESIUM: Magnesium: 1.9 mg/dL (ref 1.7–2.4)

## 2021-09-26 LAB — LACTIC ACID, PLASMA
Lactic Acid, Venous: 2.9 mmol/L (ref 0.5–1.9)
Lactic Acid, Venous: 2 mmol/L (ref 0.5–1.9)
Lactic Acid, Venous: 1.7 mmol/L (ref 0.5–1.9)

## 2021-09-26 LAB — CREATININE, URINE, RANDOM: Creatinine, Urine: 151 mg/dL

## 2021-09-26 LAB — APTT: aPTT: 30 seconds (ref 24–36)

## 2021-09-26 LAB — CK: Total CK: 157 U/L (ref 49–397)

## 2021-09-26 MED ORDER — CIPROFLOXACIN HCL 0.3 % OP SOLN
2.0000 [drp] | OPHTHALMIC | Status: DC
  Administered 2021-09-26 – 2021-10-01 (×21): 2 [drp] via OPHTHALMIC
  Filled 2021-09-26: qty 2.5

## 2021-09-26 MED ORDER — ALBUTEROL SULFATE (2.5 MG/3ML) 0.083% IN NEBU
5.0000 mg | INHALATION_SOLUTION | Freq: Once | RESPIRATORY_TRACT | Status: AC
  Administered 2021-09-26: 5 mg via RESPIRATORY_TRACT
  Filled 2021-09-26: qty 6

## 2021-09-26 MED ORDER — POTASSIUM CHLORIDE 10 MEQ/100ML IV SOLN
10.0000 meq | Freq: Once | INTRAVENOUS | Status: AC
  Administered 2021-09-26: 10 meq via INTRAVENOUS
  Filled 2021-09-26: qty 100

## 2021-09-26 MED ORDER — LACTATED RINGERS IV BOLUS
1000.0000 mL | Freq: Once | INTRAVENOUS | Status: AC
  Administered 2021-09-26: 1000 mL via INTRAVENOUS

## 2021-09-26 MED ORDER — METHYLPREDNISOLONE SODIUM SUCC 125 MG IJ SOLR
125.0000 mg | Freq: Once | INTRAMUSCULAR | Status: AC
  Administered 2021-09-26: 125 mg via INTRAVENOUS
  Filled 2021-09-26: qty 2

## 2021-09-26 MED ORDER — POTASSIUM CHLORIDE 10 MEQ/100ML IV SOLN
10.0000 meq | INTRAVENOUS | Status: AC
  Administered 2021-09-26 – 2021-09-27 (×4): 10 meq via INTRAVENOUS
  Filled 2021-09-26 (×4): qty 100

## 2021-09-26 MED ORDER — SODIUM CHLORIDE 0.9 % IV BOLUS
1000.0000 mL | Freq: Once | INTRAVENOUS | Status: AC
  Administered 2021-09-26: 1000 mL via INTRAVENOUS

## 2021-09-26 MED ORDER — SODIUM CHLORIDE (PF) 0.9 % IJ SOLN
INTRAMUSCULAR | Status: AC
  Filled 2021-09-26: qty 50

## 2021-09-26 MED ORDER — NICOTINE 14 MG/24HR TD PT24
14.0000 mg | MEDICATED_PATCH | Freq: Every day | TRANSDERMAL | Status: DC
  Administered 2021-09-27 – 2021-10-01 (×5): 14 mg via TRANSDERMAL
  Filled 2021-09-26 (×5): qty 1

## 2021-09-26 MED ORDER — IPRATROPIUM BROMIDE 0.02 % IN SOLN
0.5000 mg | Freq: Once | RESPIRATORY_TRACT | Status: AC
  Administered 2021-09-26: 0.5 mg via RESPIRATORY_TRACT
  Filled 2021-09-26: qty 2.5

## 2021-09-26 MED ORDER — SODIUM CHLORIDE 0.9 % IV SOLN
2.0000 g | Freq: Once | INTRAVENOUS | Status: AC
  Administered 2021-09-26: 2 g via INTRAVENOUS
  Filled 2021-09-26: qty 20

## 2021-09-26 MED ORDER — SODIUM CHLORIDE 0.9 % IV SOLN
500.0000 mg | Freq: Once | INTRAVENOUS | Status: AC
  Administered 2021-09-26: 500 mg via INTRAVENOUS
  Filled 2021-09-26: qty 5

## 2021-09-26 MED ORDER — IOHEXOL 350 MG/ML SOLN
100.0000 mL | Freq: Once | INTRAVENOUS | Status: AC | PRN
  Administered 2021-09-26: 100 mL via INTRAVENOUS

## 2021-09-26 NOTE — Assessment & Plan Note (Signed)
-  SIRS criteria met with    tachycardia    RR >20 Today's Vitals   09/26/21 1941 09/26/21 2000 09/26/21 2024 09/26/21 2030  BP: (!) 127/100 (!) 129/99  (!) 122/99  Pulse: (!) 125 (!) 125  (!) 124  Resp: (!) 28 (!) 29 (!) 28 (!) 45  Temp:      TempSrc:      SpO2: 95% 96%  92%  Weight:      Height:      PainSc:        The recent clinical data is shown below. Vitals:   09/26/21 1941 09/26/21 2000 09/26/21 2024 09/26/21 2030  BP: (!) 127/100 (!) 129/99  (!) 122/99  Pulse: (!) 125 (!) 125  (!) 124  Resp: (!) 28 (!) 29 (!) 28 (!) 45  Temp:      TempSrc:      SpO2: 95% 96%  92%  Weight:      Height:        -Most likely source being:  Pulmonary    Patient meeting criteria for Severe sepsis with    evidence of end organ damage/organ dysfunction such as    elevated lactic acid >2     Component Value Date/Time   LATICACIDVEN 2.0 (HH) 09/26/2021 1814      Acute hypoxia requiring new supplemental oxygen, SpO2: 92 % O2 Flow Rate (L/min): 5 L/min     - Obtain serial lactic acid and procalcitonin level.  - Initiated IV antibiotics in ER: Antibiotics Given (last 72 hours)    Date/Time Action Medication Dose Rate   09/26/21 1653 New Bag/Given   cefTRIAXone (ROCEPHIN) 2 g in sodium chloride 0.9 % 100 mL IVPB 2 g 200 mL/hr   09/26/21 1655 New Bag/Given   azithromycin (ZITHROMAX) 500 mg in sodium chloride 0.9 % 250 mL IVPB 500 mg 250 mL/hr      Will continue  on : Rocephin azithromycin   - await results of blood and urine culture  - Rehydrate aggressively  Intravenous fluids were administered    9:35 PM

## 2021-09-26 NOTE — ED Notes (Signed)
Informed provider of pt's critical Lactic acid 2.0

## 2021-09-26 NOTE — Subjective & Objective (Addendum)
Patient was seen by PCP office  today noted to be hypoxic down to 70% on room air  not on oxygen at baseline but required 3 L in the office on his satting 86 to 87% and his heart rate was in 140s MD recommended patient to go to emergency department work Patient has been reporting diarrhea which is chronic On arrival to ER still satting mid 43s on 3 L Patient reports not feeling so good for a while lost 21 pounds over past 8 months have decreased appetite have not had any fevers mild cough no sick contacts patient has known history of COPD but never been on oxygen also mild dementia

## 2021-09-26 NOTE — Assessment & Plan Note (Signed)
Reports is chronic but has not been seen by Urology  Has had abnormal urine since he was a young man

## 2021-09-26 NOTE — ED Notes (Signed)
Informed provider of critical lactic acid 2.9

## 2021-09-26 NOTE — Assessment & Plan Note (Signed)
restart home meds

## 2021-09-26 NOTE — Progress Notes (Signed)
RT NOTE:  Pt placed on BiPAP per EDP order. Pt states he feels comfortable on the machine at this time. Vitals are stable, RT will continue to monitor.

## 2021-09-26 NOTE — Assessment & Plan Note (Signed)
-  -   Will initiate: Steroid taper  -  Antibiotics rocephin - Albuterol PRN, - scheduled duoneb,  -  Breo or Dulera at discharge   -  Mucinex.  Titrate O2 to saturation >90%. Follow patients respiratory status.     VBG  -   PCCM consult to see in AM,  - BiPAP ordered PRN for increased work of breathing.  Currently mentating well no evidence of symptomatic hypercarbia

## 2021-09-26 NOTE — Assessment & Plan Note (Signed)
- -  Patient presenting with  productive cough, fever    Hypoxia and infiltrate in on chest x-ray -Infiltrate on CXR and 2-3 characteristics (fever, leukocytosis, purulent sputum) are consistent with pneumonia. -This appears to be most likely community-acquired pneumonia.       will admit for treatment of CAP will start on appropriate antibiotic coverage. - Rocephin/azithromycin   Obtain:  sputum cultures,                 Obtain respiratory panel                                    COVID PCR negative                   blood cultures and sputum cultures ordered                   strep pneumo UA antigen,                    check for Legionella antigen.                Provide oxygen as needed.

## 2021-09-26 NOTE — Assessment & Plan Note (Signed)
this patient has acute respiratory failure with Hypoxia   as documented by the presence of following: O2 saturatio< 90% on RA  Likely due to:   Pneumonia, COPD exacerbation  Provide O2 therapy and titrate as needed  Continuous pulse ox  check Pulse ox with ambulation prior to discharge   may need  TC consult for home O2 set up    flutter valve ordered

## 2021-09-26 NOTE — Assessment & Plan Note (Signed)
Order nutrition consult Check prealbumin

## 2021-09-26 NOTE — Assessment & Plan Note (Signed)
Reports he has quit few wks ago  Will monitor for any signs of withdraw in case that is not accurate

## 2021-09-26 NOTE — Progress Notes (Signed)
Pt requesting to come off BIPAP at this time.  Pt placed on 5 LPM [place] and tolerating fairly well.  Pt states he is more comfortable on the . MD notified

## 2021-09-26 NOTE — ED Triage Notes (Signed)
Pt via POV sent from Dr office due to low O2 saturation in office at appointment today, being seen for COPD eval. Pt arrives visibly SOB with O2 sat 70% on room air, increased to 80% on 3L nasal cannula. Denies pain. Crackles and wheezing noted in all lung fields.

## 2021-09-26 NOTE — Assessment & Plan Note (Signed)
-   will replace and repeat in AM,  check magnesium level and replace as needed ° °

## 2021-09-26 NOTE — ED Notes (Signed)
Patient transported to CTwith respiratory assistance due to BiPap

## 2021-09-26 NOTE — Progress Notes (Signed)
Pt instructed on use of Flutter Valve.  Pt demonstrated with good effort and technique.  

## 2021-09-26 NOTE — Assessment & Plan Note (Signed)
Continue Aricept Monitor for any sign of sundowning

## 2021-09-26 NOTE — ED Provider Notes (Addendum)
[facility] DEPT Provider Note   CSN: [id number] Arrival date & time: 09/26/21  1531     History  Chief Complaint  Patient presents with   Shortness of Breath    Travis Hicks is a 71 y.o. male.   Shortness of Breath Patient presents from primary care office.  Shortness of breath.  Hypoxic in the 70s on room air.  Not on oxygen at baseline but history of COPD.  Has been feeling bad since Saturday but has reportedly been feeling bad for a while now.  Has lost 21 pounds over the last 8 months.  Decreased appetite.  Not on oxygen at home.  No fevers.  States that all his organs feel off.  Has had a little bit of a cough.  No known sick contacts.  Reviewed note from PCP office but it is incomplete.    Past Medical History:  Diagnosis Date   Cataract    veru small per pt .   Emphysema of lung ([place])    Glaucoma 2018   Overactive bladder     Home Medications Prior to Admission medications   Medication Sig Start Date End Date Taking? Authorizing Provider  b complex vitamins tablet Take 1 tablet by mouth daily. 01/14/13   Plotnikov, Evie Lacks, MD  Cholecalciferol (VITAMIN D3) 50 MCG (2000 UT) capsule Take 2 capsules (4,000 Units total) by mouth daily. 03/11/20   Plotnikov, Evie Lacks, MD  donepezil (ARICEPT) 5 MG tablet Take 1 tablet (5 mg total) by mouth at bedtime. 03/09/21   Plotnikov, Evie Lacks, MD  latanoprost (XALATAN) 0.005 % ophthalmic solution  12/01/16   [provider]  triamcinolone ointment (KENALOG) 0.5 % Apply 1 application topically 3 (three) times daily. 11/09/20 11/09/21  Plotnikov, Evie Lacks, MD  umeclidinium-vilanterol (ANORO ELLIPTA) 62.5-25 MCG/ACT AEPB Inhale 1 puff into the lungs daily. 02/02/21   Plotnikov, Evie Lacks, MD      Allergies    Viagra [sildenafil citrate]    Review of Systems   Review of Systems  Respiratory:  Positive for shortness of breath.     Physical Exam Updated Vital Signs BP (!) 122/99   Pulse  (!) 124   Temp 98.5 F (36.9 C) (Axillary)   Resp (!) 45   Ht '6\' 1"'$  (1.854 m)   Wt 65.8 kg   SpO2 92%   BMI 19.13 kg/m  Physical Exam Vitals and nursing note reviewed.  HENT:     Head: Normocephalic.  Pulmonary:     Comments: Diffuse harsh breath sounds with prolonged expirations. Musculoskeletal:     Right lower leg: No edema.     Left lower leg: No edema.  Neurological:     Mental Status: He is alert.     ED Results / Procedures / Treatments   Labs (all labs ordered are listed, but only abnormal results are displayed) Labs Reviewed  COMPREHENSIVE METABOLIC PANEL - Abnormal; Notable for the following components:      Result Value   Potassium 2.9 (*)    Chloride 96 (*)    Glucose, Bld 199 (*)    Creatinine, Ser 1.38 (*)    Albumin 3.2 (*)    AST 46 (*)    Total Bilirubin 1.6 (*)    GFR, Estimated 55 (*)    All other components within normal limits  LACTIC ACID, PLASMA - Abnormal; Notable for the following components:   Lactic Acid, Venous 2.9 (*)    All other components within normal  limits  LACTIC ACID, PLASMA - Abnormal; Notable for the following components:   Lactic Acid, Venous 2.0 (*)    All other components within normal limits  CBC WITH DIFFERENTIAL/PLATELET - Abnormal; Notable for the following components:   WBC 10.9 (*)    RBC 4.17 (*)    MCV 100.5 (*)    MCH 34.1 (*)    Neutro Abs 9.2 (*)    Lymphs Abs 0.3 (*)    Monocytes Absolute 1.3 (*)    All other components within normal limits  URINALYSIS, ROUTINE W REFLEX MICROSCOPIC - Abnormal; Notable for the following components:   Color, Urine AMBER (*)    APPearance HAZY (*)    Specific Gravity, Urine >1.046 (*)    Glucose, UA 50 (*)    Hgb urine dipstick MODERATE (*)    Protein, ur 100 (*)    Bacteria, UA RARE (*)    All other components within normal limits  RESP PANEL BY RT-PCR (FLU A&B, COVID) ARPGX2  CULTURE, BLOOD (ROUTINE X 2)  CULTURE, BLOOD (ROUTINE X 2)    EKG None  Radiology CT  Angio Chest PE W and/or Wo Contrast  Result Date: 09/26/2021 CLINICAL DATA:  Pulmonary embolism (PE) suspected, high prob; Abdominal pain, acute, nonlocalized Abd pain Pt via POV sent from Dr office due to low O2 saturation in office at appointment today, being seen for COPD eval. Pt arrives visibly SOB with O2 sat 70% on room air, increased to 80% on 3L nasal cannula. EXAM: CT ANGIOGRAPHY CHEST CT ABDOMEN AND PELVIS WITH CONTRAST TECHNIQUE: Multidetector CT imaging of the chest was performed using the standard protocol during bolus administration of intravenous contrast. Multiplanar CT image reconstructions and MIPs were obtained to evaluate the vascular anatomy. Multidetector CT imaging of the abdomen and pelvis was performed using the standard protocol during bolus administration of intravenous contrast. RADIATION DOSE REDUCTION: This exam was performed according to the departmental dose-optimization program which includes automated exposure control, adjustment of the mA and/or kV according to patient size and/or use of iterative reconstruction technique. CONTRAST:  113m OMNIPAQUE IOHEXOL 350 MG/ML SOLN COMPARISON:  None Available. FINDINGS: CTA CHEST FINDINGS Cardiovascular: Satisfactory opacification of the pulmonary arteries to the segmental level. No central or segmental evidence of pulmonary embolism. Limited evaluation of the subsegmental level due to timing of contrast. The main pulmonary artery measures at the upper limits of normal. Normal heart size. No significant pericardial effusion. The thoracic aorta is normal in caliber. Mild atherosclerotic plaque of the thoracic aorta. No coronary artery calcifications. Mediastinum/Nodes: Enlarged right hilar lymph node measuring up to 1.7 cm. Prominent left hilar lymph nodes. Calcifications along the right left hilar lymph nodes may be sequelae of prior granulomatous disease. No enlarged mediastinal or axillary lymph nodes. Thyroid gland, trachea, and  esophagus demonstrate no significant findings. Lungs/Pleura: Emphysematous changes. Diffuse bronchial wall thickening. Peribronchovascular tree-in-bud nodularity most prominent in the upper lung zones. Linear atelectasis versus scarring within the right middle lobe. No pulmonary nodule. No pulmonary mass. No pleural effusion. No pneumothorax. Musculoskeletal: No chest wall abnormality. No suspicious lytic or blastic osseous lesions. No acute displaced fracture. Multilevel degenerative changes of the spine. Review of the MIP images confirms the above findings. CT ABDOMEN and PELVIS FINDINGS Hepatobiliary: No focal liver abnormality. The gallbladder is contracted. No gallstones, gallbladder wall thickening, or pericholecystic fluid. No biliary dilatation. Pancreas: No focal lesion. Normal pancreatic contour. No surrounding inflammatory changes. No main pancreatic ductal dilatation. Spleen: Normal in size without focal  abnormality. Adrenals/Urinary Tract: No adrenal nodule bilaterally. Bilateral kidneys enhance symmetrically. A left renal 1.4 cm fluid density lesion likely represents a simple renal cyst. Simple renal cysts, in the absence of clinically indicated signs/symptoms, require no independent follow-up. No hydronephrosis. No hydroureter. The urinary bladder is unremarkable. Stomach/Bowel: Stomach is within normal limits. No evidence of bowel wall thickening or dilatation. Scattered colonic diverticulosis appendix appears normal. Vascular/Lymphatic: No abdominal aorta or iliac aneurysm. Severe atherosclerotic plaque of the aorta and its branches. No abdominal, pelvic, or inguinal lymphadenopathy. Reproductive: Prostate is unremarkable. Other: No intraperitoneal free fluid. No intraperitoneal free gas. No organized fluid collection. Musculoskeletal: No abdominal wall hernia or abnormality. 3.7 cm fat density lesion within the right gluteal musculature likely represents lipomatous lesion. No suspicious lytic or  blastic osseous lesions. No acute displaced fracture. Grade 1 anterolisthesis of L4 on L5. Review of the MIP images confirms the above findings. IMPRESSION: 1. No central or segmental pulmonary embolus. Limited evaluation of the subsegmental level due to timing of contrast. 2. Pulmonary findings suggestive of an atypical infection. No follow-up needed if patient is low-risk (and has no known or suspected primary neoplasm). Non-contrast chest CT can be considered in 12 months if patient is high-risk. This recommendation follows the consensus statement: Guidelines for Management of Incidental Pulmonary Nodules Detected on CT Images: From the Fleischner Society 2017; Radiology 2017; 284:228-243. 3. Right middle lobe linear atelectasis versus scarring. Developing infection not excluded. 4. Right hilar lymphadenopathy may be reactive in etiology. Recommend attention on follow-up. 5. Colonic diverticulosis with no acute diverticulitis. 6.  Aortic Atherosclerosis (ICD10-I70.0). Electronically Signed   By: Iven Finn M.D.   On: 09/26/2021 19:25   CT ABDOMEN PELVIS W CONTRAST  Result Date: 09/26/2021 CLINICAL DATA:  Pulmonary embolism (PE) suspected, high prob; Abdominal pain, acute, nonlocalized Abd pain Pt via POV sent from Dr office due to low O2 saturation in office at appointment today, being seen for COPD eval. Pt arrives visibly SOB with O2 sat 70% on room air, increased to 80% on 3L nasal cannula. EXAM: CT ANGIOGRAPHY CHEST CT ABDOMEN AND PELVIS WITH CONTRAST TECHNIQUE: Multidetector CT imaging of the chest was performed using the standard protocol during bolus administration of intravenous contrast. Multiplanar CT image reconstructions and MIPs were obtained to evaluate the vascular anatomy. Multidetector CT imaging of the abdomen and pelvis was performed using the standard protocol during bolus administration of intravenous contrast. RADIATION DOSE REDUCTION: This exam was performed according to the  departmental dose-optimization program which includes automated exposure control, adjustment of the mA and/or kV according to patient size and/or use of iterative reconstruction technique. CONTRAST:  129m OMNIPAQUE IOHEXOL 350 MG/ML SOLN COMPARISON:  None Available. FINDINGS: CTA CHEST FINDINGS Cardiovascular: Satisfactory opacification of the pulmonary arteries to the segmental level. No central or segmental evidence of pulmonary embolism. Limited evaluation of the subsegmental level due to timing of contrast. The main pulmonary artery measures at the upper limits of normal. Normal heart size. No significant pericardial effusion. The thoracic aorta is normal in caliber. Mild atherosclerotic plaque of the thoracic aorta. No coronary artery calcifications. Mediastinum/Nodes: Enlarged right hilar lymph node measuring up to 1.7 cm. Prominent left hilar lymph nodes. Calcifications along the right left hilar lymph nodes may be sequelae of prior granulomatous disease. No enlarged mediastinal or axillary lymph nodes. Thyroid gland, trachea, and esophagus demonstrate no significant findings. Lungs/Pleura: Emphysematous changes. Diffuse bronchial wall thickening. Peribronchovascular tree-in-bud nodularity most prominent in the upper lung zones. Linear atelectasis versus scarring  within the right middle lobe. No pulmonary nodule. No pulmonary mass. No pleural effusion. No pneumothorax. Musculoskeletal: No chest wall abnormality. No suspicious lytic or blastic osseous lesions. No acute displaced fracture. Multilevel degenerative changes of the spine. Review of the MIP images confirms the above findings. CT ABDOMEN and PELVIS FINDINGS Hepatobiliary: No focal liver abnormality. The gallbladder is contracted. No gallstones, gallbladder wall thickening, or pericholecystic fluid. No biliary dilatation. Pancreas: No focal lesion. Normal pancreatic contour. No surrounding inflammatory changes. No main pancreatic ductal dilatation.  Spleen: Normal in size without focal abnormality. Adrenals/Urinary Tract: No adrenal nodule bilaterally. Bilateral kidneys enhance symmetrically. A left renal 1.4 cm fluid density lesion likely represents a simple renal cyst. Simple renal cysts, in the absence of clinically indicated signs/symptoms, require no independent follow-up. No hydronephrosis. No hydroureter. The urinary bladder is unremarkable. Stomach/Bowel: Stomach is within normal limits. No evidence of bowel wall thickening or dilatation. Scattered colonic diverticulosis appendix appears normal. Vascular/Lymphatic: No abdominal aorta or iliac aneurysm. Severe atherosclerotic plaque of the aorta and its branches. No abdominal, pelvic, or inguinal lymphadenopathy. Reproductive: Prostate is unremarkable. Other: No intraperitoneal free fluid. No intraperitoneal free gas. No organized fluid collection. Musculoskeletal: No abdominal wall hernia or abnormality. 3.7 cm fat density lesion within the right gluteal musculature likely represents lipomatous lesion. No suspicious lytic or blastic osseous lesions. No acute displaced fracture. Grade 1 anterolisthesis of L4 on L5. Review of the MIP images confirms the above findings. IMPRESSION: 1. No central or segmental pulmonary embolus. Limited evaluation of the subsegmental level due to timing of contrast. 2. Pulmonary findings suggestive of an atypical infection. No follow-up needed if patient is low-risk (and has no known or suspected primary neoplasm). Non-contrast chest CT can be considered in 12 months if patient is high-risk. This recommendation follows the consensus statement: Guidelines for Management of Incidental Pulmonary Nodules Detected on CT Images: From the Fleischner Society 2017; Radiology 2017; 284:228-243. 3. Right middle lobe linear atelectasis versus scarring. Developing infection not excluded. 4. Right hilar lymphadenopathy may be reactive in etiology. Recommend attention on follow-up. 5.  Colonic diverticulosis with no acute diverticulitis. 6.  Aortic Atherosclerosis (ICD10-I70.0). Electronically Signed   By: Iven Finn M.D.   On: 09/26/2021 19:25   DG Chest Portable 1 View  Result Date: 09/26/2021 CLINICAL DATA:  Shortness of breath. EXAM: PORTABLE CHEST 1 VIEW COMPARISON:  Chest x-ray dated January 28, 2021. FINDINGS: The heart size and mediastinal contours are within normal limits. Normal pulmonary vascularity. Hazy airspace density in the right upper lobe. No pleural effusion or pneumothorax. No acute osseous abnormality. IMPRESSION: 1. Right upper lobe pneumonia. Electronically Signed   By: Titus Dubin M.D.   On: 09/26/2021 16:04    Procedures Procedures    Medications Ordered in ED Medications  methylPREDNISolone sodium succinate (SOLU-MEDROL) 125 mg/2 mL injection 125 mg (has no administration in time range)  lactated ringers bolus 1,000 mL (has no administration in time range)  potassium chloride 10 mEq in 100 mL IVPB (has no administration in time range)  albuterol (PROVENTIL) (2.5 MG/3ML) 0.083% nebulizer solution 5 mg (5 mg Nebulization Given 09/26/21 1559)  ipratropium (ATROVENT) nebulizer solution 0.5 mg (0.5 mg Nebulization Given 09/26/21 1559)  cefTRIAXone (ROCEPHIN) 2 g in sodium chloride 0.9 % 100 mL IVPB (0 g Intravenous Stopped 09/26/21 1800)  azithromycin (ZITHROMAX) 500 mg in sodium chloride 0.9 % 250 mL IVPB (0 mg Intravenous Stopped 09/26/21 1800)  sodium chloride 0.9 % bolus 1,000 mL (0 mLs Intravenous Stopped 09/26/21  1800)  iohexol (OMNIPAQUE) 350 MG/ML injection 100 mL (100 mLs Intravenous Contrast Given 09/26/21 1856)  sodium chloride (PF) 0.9 % injection (  Given by Other 09/26/21 1925)    ED Course/ Medical Decision Making/ A&P                           Medical Decision Making Amount and/or Complexity of Data Reviewed Labs: ordered. Radiology: ordered.  Risk Prescription drug management.   Patient presents with hypoxia shortness of breath.   History of emphysema.  Not on oxygen at baseline but would be surprised if he is somewhat hypoxic normally.  However usually likely not down in the 70s.  Discussed with nursing and tech and will give a target pulse ox of around 90%.  Tachycardic and hypoxic.  Will get chest x-ray and some basic blood work.  Also states organs feel little off.  Unsure quite what to make of this but with weight loss potentially could be due to the emphysema but also could be due to an occult malignancy.  Continue spoke.  Will get chest x-ray and basic blood work but then she will need further workup to rule out required mission the hospital.  Differential diagnosis initially includes emphysema, pneumonia, pulm embolism, occult malignancy CT scan done.  Done both chest and abdomen pelvis.  Does not show pulmonary embolism but does show likely atypical pneumonia.  Negative COVID test.  Has had Rocephin and azithromycin.  Has been on BiPAP and states he is feeling somewhat better.  Giving a trial off and is on about 5 L right now.  Will give some steroids to use as does not appear that he has had any with his emphysema.  Will require admission to the hospital.  CT abdomen done and reassuring with no malignancy.  Will admit to hospitalist.  Urinalysis shows concentration without definite infection.  Creatinine also increased which could be secondary to dehydration.  Will give another fluid bolus and with low potassium will supplement.  CRITICAL CARE Performed by: Davonna Belling Total critical care time: 30 minutes Critical care time was exclusive of separately billable procedures and treating other patients. Critical care was necessary to treat or prevent imminent or life-threatening deterioration. Critical care was time spent personally by me on the following activities: development of treatment plan with patient and/or surrogate as well as nursing, discussions with consultants, evaluation of patient's response to treatment,  examination of patient, obtaining history from patient or surrogate, ordering and performing treatments and interventions, ordering and review of laboratory studies, ordering and review of radiographic studies, pulse oximetry and re-evaluation of patient's condition.          Final Clinical Impression(s) / ED Diagnoses Final diagnoses:  Hypoxia  Community acquired pneumonia, unspecified laterality  Hypokalemia  Dehydration    Rx / DC Orders ED Discharge Orders     None         Davonna Belling, MD 09/26/21 2038    Davonna Belling, MD 09/26/21 2039

## 2021-09-26 NOTE — Assessment & Plan Note (Signed)
-    evidence of acute renal failure due to presence of following: Cr increased >0.3 from baseline likely secondary to dehydration       check FeNA       Rehydrate with IV fluids    If persists despite fluid resuscitation will need renal consult.

## 2021-09-26 NOTE — ED Notes (Signed)
Respiratory at bedside.

## 2021-09-26 NOTE — Patient Instructions (Addendum)
    Go to the ED for further evaluation and treatment   Hypoxic with tachypnea tachycardia COPD exacerbation - increased SOB, cough, wheeze Probable UTI - dark urine Weight loss of 21 lbs since 02/2021 Abdominal pain

## 2021-09-26 NOTE — H&P (Signed)
Travis Hicks MGQ:[id number] DOB: 11-Jun-1950 DOA: 09/26/2021     PCP: Cassandria Anger, MD   Outpatient Specialists:     Fabienne Bruns  Ladene Artist, MD    Patient arrived to ER on 09/26/21 at 1531 Referred by Attending Davonna Belling, MD   Patient coming from:    home Lives  With family    Chief Complaint:   Chief Complaint  Patient presents with   Shortness of Breath    HPI: Travis Hicks is a 71 y.o. male with medical history significant of COPD, tobacco abuse, etoh abuse, glaucoma    Presented with   shortness of breath and hypoxia from pulmonology office Patient was seen by pulmonology today noted to be hypoxic down to 70% on room air  not on oxygen at baseline but required 3 L in the office on his satting 20 to 87% and his heart rate was in 140s pulmonology f mended patient to go to emergency department work Patient has been reporting diarrhea which is chronic On arrival to ER still satting mid 80s on 3 L Patient reports not feeling so good for a while lost 21 pounds over past 8 months have decreased appetite have not had any fevers mild cough no sick contacts patient has known history of COPD but never been on oxygen also mild dementia   Reports continue to smoke but has not been drinking for the past 2 wks  Denies drug use Had a cataract surgery a while back his left eye has been draining   Initial COVID TEST  NEGATIVE   Lab Results  Component Value Date   [place] 09/26/2021   [place] RESULT:  NEGATIVE 03/10/2019     Regarding pertinent Chronic problems:          COPD -  followed by pulmonology   not  on baseline oxygen      Dementia - on Aricept      While in ER:   Noted to have potassium down to 2.9 evidence of AKI with creatinine up to 1.38 from baseline 0.6  Hematuria also present Patient initially placed on BiPAP and then was able to transition back to nasal cannula Diagnosis COPD exacerbation started on Solu-Medrol Atrovent  albuterol given a dose of Rocephin and azithromycin CTA negative for PE but does show likely atypical pneumonia COVID test negative   CXR -right upper lobe pneumonia  CTabd/pelvis -  nonacute  CTA chest -  nonacute, no PE  evidence of infiltrate Pulmonary findings suggestive of an atypical infection. Following Medications were ordered in ER: Medications  methylPREDNISolone sodium succinate (SOLU-MEDROL) 125 mg/2 mL injection 125 mg (has no administration in time range)  lactated ringers bolus 1,000 mL (has no administration in time range)  potassium chloride 10 mEq in 100 mL IVPB (has no administration in time range)  albuterol (PROVENTIL) (2.5 MG/3ML) 0.083% nebulizer solution 5 mg (5 mg Nebulization Given 09/26/21 1559)  ipratropium (ATROVENT) nebulizer solution 0.5 mg (0.5 mg Nebulization Given 09/26/21 1559)  cefTRIAXone (ROCEPHIN) 2 g in sodium chloride 0.9 % 100 mL IVPB (0 g Intravenous Stopped 09/26/21 1800)  azithromycin (ZITHROMAX) 500 mg in sodium chloride 0.9 % 250 mL IVPB (0 mg Intravenous Stopped 09/26/21 1800)  sodium chloride 0.9 % bolus 1,000 mL (0 mLs Intravenous Stopped 09/26/21 1800)  iohexol (OMNIPAQUE) 350 MG/ML injection 100 mL (100 mLs Intravenous Contrast Given 09/26/21 1856)  sodium chloride (PF) 0.9 % injection (  Given by Other 09/26/21 1925)  ______    ED Triage Vitals  Enc Vitals Group     BP 09/26/21 1541 (!) 126/103     Pulse Rate 09/26/21 1541 (!) 139     Resp 09/26/21 1541 (!) 26     Temp 09/26/21 1541 98.3 F (36.8 C)     Temp Source 09/26/21 1541 Oral     SpO2 09/26/21 1540 (!) 81 %     Weight 09/26/21 1543 145 lb (65.8 kg)     Height 09/26/21 1543 $RemoveBefor'6\' 1"'[id number]$  (1.854 m)     Head Circumference --      Peak Flow --      Pain Score 09/26/21 1543 0     Pain Loc --      Pain Edu? --      Excl. in [place]? --   TMAX(24)@     _________________________________________ Significant initial  Findings: Abnormal Labs Reviewed  COMPREHENSIVE METABOLIC PANEL - Abnormal;  Notable for the following components:      Result Value   Potassium 2.9 (*)    Chloride 96 (*)    Glucose, Bld 199 (*)    Creatinine, Ser 1.38 (*)    Albumin 3.2 (*)    AST 46 (*)    Total Bilirubin 1.6 (*)    GFR, Estimated 55 (*)    All other components within normal limits  LACTIC ACID, PLASMA - Abnormal; Notable for the following components:   Lactic Acid, Venous 2.9 (*)    All other components within normal limits  LACTIC ACID, PLASMA - Abnormal; Notable for the following components:   Lactic Acid, Venous 2.0 (*)    All other components within normal limits  CBC WITH DIFFERENTIAL/PLATELET - Abnormal; Notable for the following components:   WBC 10.9 (*)    RBC 4.17 (*)    MCV 100.5 (*)    MCH 34.1 (*)    Neutro Abs 9.2 (*)    Lymphs Abs 0.3 (*)    Monocytes Absolute 1.3 (*)    All other components within normal limits  URINALYSIS, ROUTINE W REFLEX MICROSCOPIC - Abnormal; Notable for the following components:   Color, Urine AMBER (*)    APPearance HAZY (*)    Specific Gravity, Urine >1.046 (*)    Glucose, UA 50 (*)    Hgb urine dipstick MODERATE (*)    Protein, ur 100 (*)    Bacteria, UA RARE (*)    All other components within normal limits    _________________________ Troponin  ordered ECG: Ordered Personally reviewed and interpreted by me showing: HR : 140 Rhythm:  Sinus tachycardia Paired ventricular premature complexes Anterior infarct, old QTC 475   ____________________ This patient meets SIRS Criteria and may be septic.    The recent clinical data is shown below. Vitals:   09/26/21 1941 09/26/21 2000 09/26/21 2024 09/26/21 2030  BP: (!) 127/100 (!) 129/99  (!) 122/99  Pulse: (!) 125 (!) 125  (!) 124  Resp: (!) 28 (!) 29 (!) 28 (!) 45  Temp:      TempSrc:      SpO2: 95% 96%  92%  Weight:      Height:          WBC     Component Value Date/Time   WBC 10.9 (H) 09/26/2021 1549   LYMPHSABS 0.3 (L) 09/26/2021 1549   MONOABS 1.3 (H) 09/26/2021 1549    EOSABS 0.0 09/26/2021 1549   BASOSABS 0.0 09/26/2021 1549     Lactic Acid, Venous  Component Value Date/Time   LATICACIDVEN 2.0 (HH) 09/26/2021 1814    Procalcitonin   Ordered      UA   no evidence of UTI   hematuria   Urine analysis:    Component Value Date/Time   COLORURINE AMBER (A) 09/26/2021 2008   APPEARANCEUR HAZY (A) 09/26/2021 2008   LABSPEC >1.046 (H) 09/26/2021 2008   PHURINE 5.0 09/26/2021 2008   GLUCOSEU 50 (A) 09/26/2021 2008   GLUCOSEU NEGATIVE 03/09/2021 1535   HGBUR MODERATE (A) 09/26/2021 2008   BILIRUBINUR NEGATIVE 09/26/2021 2008   KETONESUR NEGATIVE 09/26/2021 2008   PROTEINUR 100 (A) 09/26/2021 2008   UROBILINOGEN 0.2 03/09/2021 1535   NITRITE NEGATIVE 09/26/2021 2008   LEUKOCYTESUR NEGATIVE 09/26/2021 2008    Results for orders placed or performed during the hospital encounter of 09/26/21  Resp Panel by RT-PCR (Flu A&B, Covid) Anterior Nasal Swab     Status: None   Collection Time: 09/26/21  3:50 PM   Specimen: Anterior Nasal Swab  Result Value Ref Range Status   SARS Coronavirus 2 by RT PCR NEGATIVE NEGATIVE Final         Influenza A by PCR NEGATIVE NEGATIVE Final   Influenza B by PCR NEGATIVE NEGATIVE Final           _______________________________________________ Hospitalist was called for admission for   Hypoxia    Community acquired pneumonia, unspecified laterality    Hypokalemia    Dehydration     The following Work up has been ordered so far:  Orders Placed This Encounter  Procedures   Culture, blood (routine x 2)   Resp Panel by RT-PCR (Flu A&B, Covid) Anterior Nasal Swab   DG Chest Portable 1 View   CT Angio Chest PE W and/or Wo Contrast   CT ABDOMEN PELVIS W CONTRAST   Comprehensive metabolic panel   Lactic acid, plasma   CBC with Differential   Urinalysis, Routine w reflex microscopic   Consult to hospitalist   Bipap   ED EKG   EKG 12-Lead   EKG 12-Lead     OTHER Significant initial  Findings:  labs  showing:    Recent Labs  Lab 09/26/21 1549  NA 140  K 2.9*  CO2 29  GLUCOSE 199*  BUN 16  CREATININE 1.38*  CALCIUM 8.9    Cr   Up from baseline see below Lab Results  Component Value Date   CREATININE 1.38 (H) 09/26/2021   CREATININE 0.60 (L) 01/28/2021   CREATININE 0.80 07/09/2020    Recent Labs  Lab 09/26/21 1549  AST 46*  ALT 23  ALKPHOS 96  BILITOT 1.6*  PROT 7.7  ALBUMIN 3.2*   Lab Results  Component Value Date   CALCIUM 8.9 09/26/2021          Plt: Lab Results  Component Value Date   PLT 264 09/26/2021      Lab Results  Component Value Date   SARSCOV2NAA NEGATIVE 09/26/2021   SARSCOV2NAA RESULT:  NEGATIVE 03/10/2019    Venous  Blood Gas result:  pH  pH, Ven 7.35 Acid-Base Excess 5.2 High  mmol/L  pCO2, Ven 59 mmHg O2 Saturation 86.6 %  pO2, Ven 59 High  mmHg           Recent Labs  Lab 09/26/21 1549  WBC 10.9*  NEUTROABS 9.2*  HGB 14.2  HCT 41.9  MCV 100.5*  PLT 264    HG/HCT   stable,       Component Value  Date/Time   HGB 14.2 09/26/2021 1549   HCT 41.9 09/26/2021 1549   MCV 100.5 (H) 09/26/2021 1549      Cardiac Panel (last 3 results) Recent Labs    09/26/21 1549  CKTOTAL 157    .car BNP (last 3 results) Recent Labs    01/28/21 1214  BNP 364.8*        Cultures:    Component Value Date/Time   SDES URINE, RANDOM 07/14/2013 2351   SPECREQUEST NONE 07/14/2013 2351   CULT NO GROWTH Performed at [facility] 07/14/2013 2351   REPTSTATUS 07/16/2013 FINAL 07/14/2013 2351     Radiological Exams on Admission: CT Angio Chest PE W and/or Wo Contrast  Result Date: 09/26/2021 CLINICAL DATA:  Pulmonary embolism (PE) suspected, high prob; Abdominal pain, acute, nonlocalized Abd pain Pt via POV sent from Dr office due to low O2 saturation in office at appointment today, being seen for COPD eval. Pt arrives visibly SOB with O2 sat 70% on room air, increased to 80% on 3L nasal cannula. EXAM: CT ANGIOGRAPHY CHEST CT  ABDOMEN AND PELVIS WITH CONTRAST TECHNIQUE: Multidetector CT imaging of the chest was performed using the standard protocol during bolus administration of intravenous contrast. Multiplanar CT image reconstructions and MIPs were obtained to evaluate the vascular anatomy. Multidetector CT imaging of the abdomen and pelvis was performed using the standard protocol during bolus administration of intravenous contrast. RADIATION DOSE REDUCTION: This exam was performed according to the departmental dose-optimization program which includes automated exposure control, adjustment of the mA and/or kV according to patient size and/or use of iterative reconstruction technique. CONTRAST:  110mL OMNIPAQUE IOHEXOL 350 MG/ML SOLN COMPARISON:  None Available. FINDINGS: CTA CHEST FINDINGS Cardiovascular: Satisfactory opacification of the pulmonary arteries to the segmental level. No central or segmental evidence of pulmonary embolism. Limited evaluation of the subsegmental level due to timing of contrast. The main pulmonary artery measures at the upper limits of normal. Normal heart size. No significant pericardial effusion. The thoracic aorta is normal in caliber. Mild atherosclerotic plaque of the thoracic aorta. No coronary artery calcifications. Mediastinum/Nodes: Enlarged right hilar lymph node measuring up to 1.7 cm. Prominent left hilar lymph nodes. Calcifications along the right left hilar lymph nodes may be sequelae of prior granulomatous disease. No enlarged mediastinal or axillary lymph nodes. Thyroid gland, trachea, and esophagus demonstrate no significant findings. Lungs/Pleura: Emphysematous changes. Diffuse bronchial wall thickening. Peribronchovascular tree-in-bud nodularity most prominent in the upper lung zones. Linear atelectasis versus scarring within the right middle lobe. No pulmonary nodule. No pulmonary mass. No pleural effusion. No pneumothorax. Musculoskeletal: No chest wall abnormality. No suspicious lytic  or blastic osseous lesions. No acute displaced fracture. Multilevel degenerative changes of the spine. Review of the MIP images confirms the above findings. CT ABDOMEN and PELVIS FINDINGS Hepatobiliary: No focal liver abnormality. The gallbladder is contracted. No gallstones, gallbladder wall thickening, or pericholecystic fluid. No biliary dilatation. Pancreas: No focal lesion. Normal pancreatic contour. No surrounding inflammatory changes. No main pancreatic ductal dilatation. Spleen: Normal in size without focal abnormality. Adrenals/Urinary Tract: No adrenal nodule bilaterally. Bilateral kidneys enhance symmetrically. A left renal 1.4 cm fluid density lesion likely represents a simple renal cyst. Simple renal cysts, in the absence of clinically indicated signs/symptoms, require no independent follow-up. No hydronephrosis. No hydroureter. The urinary bladder is unremarkable. Stomach/Bowel: Stomach is within normal limits. No evidence of bowel wall thickening or dilatation. Scattered colonic diverticulosis appendix appears normal. Vascular/Lymphatic: No abdominal aorta or iliac aneurysm. Severe atherosclerotic plaque of  the aorta and its branches. No abdominal, pelvic, or inguinal lymphadenopathy. Reproductive: Prostate is unremarkable. Other: No intraperitoneal free fluid. No intraperitoneal free gas. No organized fluid collection. Musculoskeletal: No abdominal wall hernia or abnormality. 3.7 cm fat density lesion within the right gluteal musculature likely represents lipomatous lesion. No suspicious lytic or blastic osseous lesions. No acute displaced fracture. Grade 1 anterolisthesis of L4 on L5. Review of the MIP images confirms the above findings. IMPRESSION: 1. No central or segmental pulmonary embolus. Limited evaluation of the subsegmental level due to timing of contrast. 2. Pulmonary findings suggestive of an atypical infection. No follow-up needed if patient is low-risk (and has no known or suspected  primary neoplasm). Non-contrast chest CT can be considered in 12 months if patient is high-risk. This recommendation follows the consensus statement: Guidelines for Management of Incidental Pulmonary Nodules Detected on CT Images: From the Fleischner Society 2017; Radiology 2017; 284:228-243. 3. Right middle lobe linear atelectasis versus scarring. Developing infection not excluded. 4. Right hilar lymphadenopathy may be reactive in etiology. Recommend attention on follow-up. 5. Colonic diverticulosis with no acute diverticulitis. 6.  Aortic Atherosclerosis (ICD10-I70.0). Electronically Signed   By: Iven Finn M.D.   On: 09/26/2021 19:25   CT ABDOMEN PELVIS W CONTRAST  Result Date: 09/26/2021 CLINICAL DATA:  Pulmonary embolism (PE) suspected, high prob; Abdominal pain, acute, nonlocalized Abd pain Pt via POV sent from Dr office due to low O2 saturation in office at appointment today, being seen for COPD eval. Pt arrives visibly SOB with O2 sat 70% on room air, increased to 80% on 3L nasal cannula. EXAM: CT ANGIOGRAPHY CHEST CT ABDOMEN AND PELVIS WITH CONTRAST TECHNIQUE: Multidetector CT imaging of the chest was performed using the standard protocol during bolus administration of intravenous contrast. Multiplanar CT image reconstructions and MIPs were obtained to evaluate the vascular anatomy. Multidetector CT imaging of the abdomen and pelvis was performed using the standard protocol during bolus administration of intravenous contrast. RADIATION DOSE REDUCTION: This exam was performed according to the departmental dose-optimization program which includes automated exposure control, adjustment of the mA and/or kV according to patient size and/or use of iterative reconstruction technique. CONTRAST:  149mL OMNIPAQUE IOHEXOL 350 MG/ML SOLN COMPARISON:  None Available. FINDINGS: CTA CHEST FINDINGS Cardiovascular: Satisfactory opacification of the pulmonary arteries to the segmental level. No central or segmental  evidence of pulmonary embolism. Limited evaluation of the subsegmental level due to timing of contrast. The main pulmonary artery measures at the upper limits of normal. Normal heart size. No significant pericardial effusion. The thoracic aorta is normal in caliber. Mild atherosclerotic plaque of the thoracic aorta. No coronary artery calcifications. Mediastinum/Nodes: Enlarged right hilar lymph node measuring up to 1.7 cm. Prominent left hilar lymph nodes. Calcifications along the right left hilar lymph nodes may be sequelae of prior granulomatous disease. No enlarged mediastinal or axillary lymph nodes. Thyroid gland, trachea, and esophagus demonstrate no significant findings. Lungs/Pleura: Emphysematous changes. Diffuse bronchial wall thickening. Peribronchovascular tree-in-bud nodularity most prominent in the upper lung zones. Linear atelectasis versus scarring within the right middle lobe. No pulmonary nodule. No pulmonary mass. No pleural effusion. No pneumothorax. Musculoskeletal: No chest wall abnormality. No suspicious lytic or blastic osseous lesions. No acute displaced fracture. Multilevel degenerative changes of the spine. Review of the MIP images confirms the above findings. CT ABDOMEN and PELVIS FINDINGS Hepatobiliary: No focal liver abnormality. The gallbladder is contracted. No gallstones, gallbladder wall thickening, or pericholecystic fluid. No biliary dilatation. Pancreas: No focal lesion. Normal pancreatic contour.  No surrounding inflammatory changes. No main pancreatic ductal dilatation. Spleen: Normal in size without focal abnormality. Adrenals/Urinary Tract: No adrenal nodule bilaterally. Bilateral kidneys enhance symmetrically. A left renal 1.4 cm fluid density lesion likely represents a simple renal cyst. Simple renal cysts, in the absence of clinically indicated signs/symptoms, require no independent follow-up. No hydronephrosis. No hydroureter. The urinary bladder is unremarkable.  Stomach/Bowel: Stomach is within normal limits. No evidence of bowel wall thickening or dilatation. Scattered colonic diverticulosis appendix appears normal. Vascular/Lymphatic: No abdominal aorta or iliac aneurysm. Severe atherosclerotic plaque of the aorta and its branches. No abdominal, pelvic, or inguinal lymphadenopathy. Reproductive: Prostate is unremarkable. Other: No intraperitoneal free fluid. No intraperitoneal free gas. No organized fluid collection. Musculoskeletal: No abdominal wall hernia or abnormality. 3.7 cm fat density lesion within the right gluteal musculature likely represents lipomatous lesion. No suspicious lytic or blastic osseous lesions. No acute displaced fracture. Grade 1 anterolisthesis of L4 on L5. Review of the MIP images confirms the above findings. IMPRESSION: 1. No central or segmental pulmonary embolus. Limited evaluation of the subsegmental level due to timing of contrast. 2. Pulmonary findings suggestive of an atypical infection. No follow-up needed if patient is low-risk (and has no known or suspected primary neoplasm). Non-contrast chest CT can be considered in 12 months if patient is high-risk. This recommendation follows the consensus statement: Guidelines for Management of Incidental Pulmonary Nodules Detected on CT Images: From the Fleischner Society 2017; Radiology 2017; 284:228-243. 3. Right middle lobe linear atelectasis versus scarring. Developing infection not excluded. 4. Right hilar lymphadenopathy may be reactive in etiology. Recommend attention on follow-up. 5. Colonic diverticulosis with no acute diverticulitis. 6.  Aortic Atherosclerosis (ICD10-I70.0). Electronically Signed   By: Iven Finn M.D.   On: 09/26/2021 19:25   DG Chest Portable 1 View  Result Date: 09/26/2021 CLINICAL DATA:  Shortness of breath. EXAM: PORTABLE CHEST 1 VIEW COMPARISON:  Chest x-ray dated January 28, 2021. FINDINGS: The heart size and mediastinal contours are within normal limits.  Normal pulmonary vascularity. Hazy airspace density in the right upper lobe. No pleural effusion or pneumothorax. No acute osseous abnormality. IMPRESSION: 1. Right upper lobe pneumonia. Electronically Signed   By: Titus Dubin M.D.   On: 09/26/2021 16:04   _______________________________________________________________________________________________________ Latest  Blood pressure (!) 122/99, pulse (!) 124, temperature 98.5 F (36.9 C), temperature source Axillary, resp. rate (!) 45, height $RemoveBe'6\' 1"'vdvnBbdXX$  (1.854 m), weight 65.8 kg, SpO2 92 %.   Vitals  labs and radiology finding personally reviewed  Review of Systems:    Pertinent positives include:  chills, fatigue,   shortness of breath at rest. dyspnea on exertion,  excess mucus,  productive cough, wheezing. Constitutional:  No weight loss, night sweats, Fevers, weight loss  HEENT:  No headaches, Difficulty swallowing,Tooth/dental problems,Sore throat,  No sneezing, itching, ear ache, nasal congestion, post nasal drip,  Cardio-vascular:  No chest pain, Orthopnea, PND, anasarca, dizziness, palpitations.no Bilateral lower extremity swelling  GI:  No heartburn, indigestion, abdominal pain, nausea, vomiting, diarrhea, change in bowel habits, loss of appetite, melena, blood in stool, hematemesis Resp:   NoNo non-productive cough, No coughing up of blood.No change in color of mucus.No  Skin:  no rash or lesions. No jaundice GU:  no dysuria, change in color of urine, no urgency or frequency. No straining to urinate.  No flank pain.  Musculoskeletal:  No joint pain or no joint swelling. No decreased range of motion. No back pain.  Psych:  No change in mood or affect.  No depression or anxiety. No memory loss.  Neuro: no localizing neurological complaints, no tingling, no weakness, no double vision, no gait abnormality, no slurred speech, no confusion  All systems reviewed and apart from [place] all are  negative _______________________________________________________________________________________________ Past Medical History:   Past Medical History:  Diagnosis Date   Cataract    veru small per pt .   Emphysema of lung ([place])    Glaucoma 2018   Overactive bladder       Past Surgical History:  Procedure Laterality Date   COLONOSCOPY     10-10-02,11-21-2005   DENTAL SURGERY     had 14 teeth pulled    HAND SURGERY Right    POLYPECTOMY     10-10-02,11-21-05    Social History:  Ambulatory   independently       reports that he has been smoking cigarettes. He has been smoking an average of 1 pack per day. He has never used smokeless tobacco. He reports that he does not currently use alcohol. He reports that he does not use drugs.     Family History:   Family History  Problem Relation Age of Onset   Pancreatic cancer Father    Glaucoma Brother    Colon cancer Neg Hx    Rectal cancer Neg Hx    Stomach cancer Neg Hx    Esophageal cancer Neg Hx    Prostate cancer Neg Hx    Colon polyps Neg Hx    ______________________________________________________________________________________________ Allergies: Allergies  Allergen Reactions   Viagra [Sildenafil Citrate]      Prior to Admission medications   Medication Sig Start Date End Date Taking? Authorizing Provider  b complex vitamins tablet Take 1 tablet by mouth daily. 01/14/13   Plotnikov, Evie Lacks, MD  Cholecalciferol (VITAMIN D3) 50 MCG (2000 UT) capsule Take 2 capsules (4,000 Units total) by mouth daily. 03/11/20   Plotnikov, Evie Lacks, MD  donepezil (ARICEPT) 5 MG tablet Take 1 tablet (5 mg total) by mouth at bedtime. 03/09/21   Plotnikov, Evie Lacks, MD  latanoprost (XALATAN) 0.005 % ophthalmic solution  12/01/16   [provider]  triamcinolone ointment (KENALOG) 0.5 % Apply 1 application topically 3 (three) times daily. 11/09/20 11/09/21  Plotnikov, Evie Lacks, MD  umeclidinium-vilanterol (ANORO ELLIPTA) 62.5-25  MCG/ACT AEPB Inhale 1 puff into the lungs daily. 02/02/21   Plotnikov, Evie Lacks, MD    ___________________________________________________________________________________________________ Physical Exam:    09/26/2021    8:30 PM 09/26/2021    8:00 PM 09/26/2021    7:41 PM  Vitals with BMI  Systolic 469 629 528  Diastolic 99 99 413  Pulse 124 125 125     1. General:  in No  Acute distress   Chronically ill   -appearing 2. Psychological: Alert and   Oriented 3. Head/ENT:    Dry Mucous Membranes                          Head Non traumatic, neck supple                           Poor Dentition 4. SKIN:  decreased Skin turgor,  Skin clean Dry and intact no rash 5. Heart: Regular rate and rhythm no  Murmur, no Rub or gallop 6. Lungs:  wheezes  crackles  bilateral 7. Abdomen: Soft,  non-tender, Non distended  bowel sounds present 8. Lower extremities: no clubbing, cyanosis, no  edema bilateral dorsal pedis pulses present 9. Neurologically Grossly intact, moving all 4 extremities equally  10. MSK: Normal range of motion    Chart has been reviewed  ______________________________________________________________________________________________  Assessment/Plan  71 y.o. male with medical history significant of COPD, tobacco abuse, etoh abuse, glaucoma    Admitted for acute respiratory failure with hypoxia    Community acquired pneumonia, unspecified laterality COPD exacerbation    Present on Admission:  CAP (community acquired pneumonia)  COPD exacerbation ([place])  Hypokalemia  Dementia without behavioral disturbance (HCC)  Acute respiratory failure with hypoxia ([place])  Sepsis ([place])  Tobacco abuse  History of ETOH abuse  Malnutrition ([place])  AKI (acute kidney injury) ([place])  Glaucoma  Hematuria     CAP (community acquired pneumonia)  - -Patient presenting with  productive cough, fever    Hypoxia and infiltrate in on chest x-ray -Infiltrate on CXR and 2-3 characteristics (fever,  leukocytosis, purulent sputum) are consistent with pneumonia. -This appears to be most likely community-acquired pneumonia.       will admit for treatment of CAP will start on appropriate antibiotic coverage. - Rocephin/azithromycin   Obtain:  sputum cultures,                 Obtain respiratory panel                                    COVID PCR negative                   blood cultures and sputum cultures ordered                   strep pneumo UA antigen,                    check for Legionella antigen.                Provide oxygen as needed.     COPD exacerbation ([place])  -  - Will initiate: Steroid taper  -  Antibiotics rocephin - Albuterol PRN, - scheduled duoneb,  -  Breo or Dulera at discharge   -  Mucinex.  Titrate O2 to saturation >90%. Follow patients respiratory status.     VBG  -   PCCM consult to see in AM,  - BiPAP ordered PRN for increased work of breathing.  Currently mentating well no evidence of symptomatic hypercarbia   Hypokalemia - will replace and repeat in AM,  check magnesium level and replace as needed   Dementia without behavioral disturbance (HCC) Continue Aricept Monitor for any sign of sundowning  Acute respiratory failure with hypoxia (HCC)  this patient has acute respiratory failure with Hypoxia   as documented by the presence of following: O2 saturatio< 90% on RA  Likely due to:   Pneumonia, COPD exacerbation  Provide O2 therapy and titrate as needed  Continuous pulse ox  check Pulse ox with ambulation prior to discharge   may need  TC consult for home O2 set up    flutter valve ordered   Sepsis ([place])  -SIRS criteria met with    tachycardia    RR >20 Today's Vitals   09/26/21 1941 09/26/21 2000 09/26/21 2024 09/26/21 2030  BP: (!) 127/100 (!) 129/99  (!) 122/99  Pulse: (!) 125 (!) 125  (!) 124  Resp: (!) 28 (!) 29 (!) 28 (!) 45  Temp:  TempSrc:      SpO2: 95% 96%  92%  Weight:      Height:      PainSc:        The recent  clinical data is shown below. Vitals:   09/26/21 1941 09/26/21 2000 09/26/21 2024 09/26/21 2030  BP: (!) 127/100 (!) 129/99  (!) 122/99  Pulse: (!) 125 (!) 125  (!) 124  Resp: (!) 28 (!) 29 (!) 28 (!) 45  Temp:      TempSrc:      SpO2: 95% 96%  92%  Weight:      Height:        -Most likely source being:  Pulmonary    Patient meeting criteria for Severe sepsis with    evidence of end organ damage/organ dysfunction such as    elevated lactic acid >2     Component Value Date/Time   LATICACIDVEN 2.0 (HH) 09/26/2021 1814      Acute hypoxia requiring new supplemental oxygen, SpO2: 92 % O2 Flow Rate (L/min): 5 L/min     - Obtain serial lactic acid and procalcitonin level.  - Initiated IV antibiotics in ER: Antibiotics Given (last 72 hours)     Date/Time Action Medication Dose Rate   09/26/21 1653 New Bag/Given   cefTRIAXone (ROCEPHIN) 2 g in sodium chloride 0.9 % 100 mL IVPB 2 g 200 mL/hr   09/26/21 1655 New Bag/Given   azithromycin (ZITHROMAX) 500 mg in sodium chloride 0.9 % 250 mL IVPB 500 mg 250 mL/hr       Will continue  on : Rocephin azithromycin   - await results of blood and urine culture  - Rehydrate aggressively  Intravenous fluids were administered    9:35 PM   Tobacco abuse  - Spoke about importance of quitting spent 5 minutes discussing options for treatment, prior attempts at quitting, and dangers of smoking  -At this point patient is   interested in quitting  - order nicotine patch   - nursing tobacco cessation protocol   History of ETOH abuse Reports he has quit few wks ago  Will monitor for any signs of withdraw in case that is not accurate  Malnutrition ([place]) Order nutrition consult Check prealbumin  AKI (acute kidney injury) ([place]) -  evidence of acute renal failure due to presence of following: Cr increased >0.3 from baseline likely secondary to dehydration       check FeNA       Rehydrate with IV fluids    If persists despite fluid  resuscitation will need renal consult.    Glaucoma restart home meds  Hematuria Reports is chronic but has not been seen by Urology  Has had abnormal urine since he was a young man     Other plan as per orders.  DVT prophylaxis:  SCD     Code Status:    Code Status: Not on file FULL CODE  as per patient    I had personally discussed CODE STATUS with patient      Family Communication:   Family not at  Bedside    Disposition Plan:        To home once workup is complete and patient is stable   Following barriers for discharge:  Afebrile, white count improving able to transition to PO antibiotics                            Will need consultants to evaluate patient prior to discharge                       Would benefit from PT/OT eval prior to DC  Ordered                   Swallow eval - SLP ordered                                      Transition of care consulted                   Nutrition    consulted                                      Consults called:    Pulmonology to see in AM   Admission status:  ED Disposition     ED Disposition  [place]: [facility] [100102]  Level of Care: Stepdown [14]  Admit to SDU based on following criteria: Respiratory Distress:  Frequent assessment and/or intervention to maintain adequate ventilation/respiration, pulmonary toilet, and respiratory treatment.  May admit patient to [facility] or [facility] if equivalent level of care is available:: No  Covid Evaluation: Confirmed COVID Negative  Diagnosis: CAP (community acquired pneumonia) [268341]  Admitting Physician: Toy Baker [[id number]]  Attending Physician: Toy Baker [[id number]]  Certification:: I certify this patient will need inpatient services for at least 2 midnights  Estimated Length of Stay: 2           inpatient     I Expect 2 midnight stay  secondary to severity of patient's current illness need for inpatient interventions justified by the following:  hemodynamic instability despite optimal treatment (tachycardia  hypoxia,  )  Severe lab/radiological/exam abnormalities including:    COPD CAP and extensive comorbidities including:  substance abuse    Dementia   That are currently affecting medical management.   I expect  patient to be hospitalized for 2 midnights requiring inpatient medical care.  Patient is at high risk for adverse outcome (such as loss of life or disability) if not treated.  Indication for inpatient stay as follows:    Hemodynamic instability despite maximal medical therapy,    New or worsening hypoxia   Need for IV antibiotics, IV fluids,  need for biPAP    Level of care   step down  tele indefinitely please discontinue once patient no longer qualifies COVID-19 Labs    Lab Results  Component Value Date   [place] 09/26/2021     Precautions: admitted as   Covid Negative      Travis Hicks 09/26/2021, 11:03 PM    [facility]     after 2 AM please page floor coverage PA If 7AM-7PM, please contact the day team taking care of the patient using [email or web address]   Patient was evaluated in the context of the global COVID-19 pandemic, which necessitated consideration that the patient might be at risk for infection with the SARS-CoV-2 virus  that causes COVID-19. Institutional protocols and algorithms that pertain to the evaluation of patients at risk for COVID-19 are in a state of rapid change based on information released by regulatory bodies including the CDC and federal and state organizations. These policies and algorithms were followed during the patient's care.

## 2021-09-26 NOTE — ED Notes (Signed)
Pt saturation in low 80s on 3L nasal cannula. Notified provider, awaiting orders.

## 2021-09-26 NOTE — Progress Notes (Signed)
Subjective:    Patient ID: Travis Hicks, male    DOB: 1950/11/14, 71 y.o.   MRN: [id number]      HPI Aydan is here for  Chief Complaint  Patient presents with   COPD     Thursday felt good.  Saturday - two days ago he started to have difficulty eating, all his organs were sore, he thinks he has an infection in urine - urine is dark and he has increased urgency and can not make it to the bathroom.     Can not walk 3 feet w/o having to stop.  (Baseline is 10 feet) - has been that way for months.   Not on oxygen at home.  The past two days everything got worse.    Decreased appetite and eating less - had issues with dentures, has dec appetite - has lost 22 lbs since January.    Cough is increased, sometimes phlegm - sometimes not.  Occ wheeze.  No fever or chills.    Abdomen is sore.  Has diarrhea chronically per wife.     Medications and allergies reviewed with patient and updated if appropriate.  Current Outpatient Medications on File Prior to Visit  Medication Sig Dispense Refill   b complex vitamins tablet Take 1 tablet by mouth daily. 100 tablet 3   Cholecalciferol (VITAMIN D3) 50 MCG (2000 UT) capsule Take 2 capsules (4,000 Units total) by mouth daily. 100 capsule 3   donepezil (ARICEPT) 5 MG tablet Take 1 tablet (5 mg total) by mouth at bedtime. 90 tablet 3   latanoprost (XALATAN) 0.005 % ophthalmic solution      triamcinolone ointment (KENALOG) 0.5 % Apply 1 application topically 3 (three) times daily. 120 g 1   umeclidinium-vilanterol (ANORO ELLIPTA) 62.5-25 MCG/ACT AEPB Inhale 1 puff into the lungs daily. 1 each 11   potassium chloride SA (KLOR-CON M) 20 MEQ tablet Take 1 tablet (20 mEq total) by mouth 2 (two) times daily for 7 days. (Patient not taking: Reported on 03/09/2021) 60 tablet 3   No current facility-administered medications on file prior to visit.    Review of Systems  Constitutional:  Positive for appetite change. Negative for chills and fever.   HENT:  Negative for congestion, ear pain, sinus pressure and sore throat.   Eyes:  Positive for visual disturbance (blurry vision - film on left eye x 3 days).  Respiratory:  Positive for cough (increased), shortness of breath (increased) and wheezing (increased).   Cardiovascular:  Negative for chest pain, palpitations and leg swelling.  Gastrointestinal:  Positive for abdominal pain (soreness all over - all organs are sore), diarrhea (constant) and nausea (if he eats something real sweet). Negative for constipation.  Genitourinary:  Positive for frequency and urgency. Negative for difficulty urinating, dysuria and hematuria.       Urine is dark  Musculoskeletal:  Negative for back pain.  Neurological:  Negative for dizziness, light-headedness and headaches.       Objective:   Vitals:   09/26/21 1406  BP: 138/80  Pulse: 83  Temp: 98.6 F (37 C)  SpO2: (!) 87%   BP Readings from Last 3 Encounters:  09/26/21 138/80  07/08/21 140/90  03/09/21 132/80   Wt Readings from Last 3 Encounters:  09/26/21 147 lb 3.2 oz (66.8 kg)  07/08/21 159 lb (72.1 kg)  03/09/21 168 lb (76.2 kg)   Body mass index is 19.96 kg/m.    Physical Exam Constitutional:  General: He is in acute distress (mild resp distress).     Appearance: He is ill-appearing.  HENT:     Head: Normocephalic and atraumatic.  Eyes:     Conjunctiva/sclera: Conjunctivae normal.  Cardiovascular:     Rate and Rhythm: Tachycardia present.  Pulmonary:     Effort: Respiratory distress present.     Breath sounds: Wheezing (minimal occ wheeze heard) present.     Comments: Diffusely decreased BS, minimal wheeze Abdominal:     General: There is distension (mild).     Palpations: Abdomen is soft.     Tenderness: There is abdominal tenderness (diffuse). There is no guarding or rebound.  Musculoskeletal:     Cervical back: Neck supple. No tenderness.     Right lower leg: No edema.     Left lower leg: No edema.  Skin:     General: Skin is warm and dry.     Findings: No rash.  Neurological:     Mental Status: He is alert.            Assessment & Plan:      Hypoxia with respiratory distress with tachypnea and tachycardia: Acute Not on oxygen at home With 3 L via [place] oxygen is only 86-87%,  HR in 140's Ddx - copd exacerbation, PE, pneumonia, CHF Needs oxygen, labs, imaging and abx / maybe steroids Advised ED for further evaluation and treatment  ? UTI: H/o UTI's Dark urine, urgency Needs UA, Ucx  Abdominal pain: Also diarrhea that sound chronic Tender on exam Needs imaging  Weight loss: Subacute Lost 22 lbs since January Related to decreased appetite Concern for cancer - needs imaging   Patient has been advised to go to the ED for further evaluation and treatment - it is not reasonable to evaluate and treat him as an outpatient. He and his wife agree and will go to [facility] ED    I spent 30 minutes dedicated to the care of this patient on the date of this encounter including review of last labs, imaging and procedures, previous PCP notes, obtaining history, communicating with the patient and his wife, calling triage nurse in ED, and documenting clinical information in the EHR

## 2021-09-26 NOTE — Assessment & Plan Note (Signed)
-   Spoke about importance of quitting spent 5 minutes discussing options for treatment, prior attempts at quitting, and dangers of smoking ? -At this point patient is    interested in quitting ? - order nicotine patch  ? - nursing tobacco cessation protocol ? ?

## 2021-09-27 ENCOUNTER — Encounter (HOSPITAL_COMMUNITY): Payer: Self-pay | Admitting: Internal Medicine

## 2021-09-27 DIAGNOSIS — J189 Pneumonia, unspecified organism: Secondary | ICD-10-CM | POA: Diagnosis not present

## 2021-09-27 DIAGNOSIS — F0390 Unspecified dementia without behavioral disturbance: Secondary | ICD-10-CM | POA: Diagnosis not present

## 2021-09-27 DIAGNOSIS — J9601 Acute respiratory failure with hypoxia: Secondary | ICD-10-CM | POA: Diagnosis not present

## 2021-09-27 DIAGNOSIS — J441 Chronic obstructive pulmonary disease with (acute) exacerbation: Secondary | ICD-10-CM | POA: Diagnosis not present

## 2021-09-27 LAB — RESPIRATORY PANEL BY PCR
Adenovirus: NOT DETECTED
Adenovirus: NOT DETECTED
Bordetella Parapertussis: NOT DETECTED
Bordetella Parapertussis: NOT DETECTED
Bordetella pertussis: NOT DETECTED
Bordetella pertussis: NOT DETECTED
Chlamydophila pneumoniae: NOT DETECTED
Chlamydophila pneumoniae: NOT DETECTED
Coronavirus 229E: NOT DETECTED
Coronavirus 229E: NOT DETECTED
Coronavirus HKU1: NOT DETECTED
Coronavirus HKU1: NOT DETECTED
Coronavirus NL63: NOT DETECTED
Coronavirus NL63: NOT DETECTED
Coronavirus OC43: NOT DETECTED
Coronavirus OC43: NOT DETECTED
Influenza A: NOT DETECTED
Influenza A: NOT DETECTED
Influenza B: NOT DETECTED
Influenza B: NOT DETECTED
Metapneumovirus: NOT DETECTED
Metapneumovirus: NOT DETECTED
Mycoplasma pneumoniae: NOT DETECTED
Mycoplasma pneumoniae: NOT DETECTED
Parainfluenza Virus 1: NOT DETECTED
Parainfluenza Virus 1: NOT DETECTED
Parainfluenza Virus 2: NOT DETECTED
Parainfluenza Virus 2: NOT DETECTED
Parainfluenza Virus 3: NOT DETECTED
Parainfluenza Virus 3: NOT DETECTED
Parainfluenza Virus 4: NOT DETECTED
Parainfluenza Virus 4: NOT DETECTED
Respiratory Syncytial Virus: NOT DETECTED
Respiratory Syncytial Virus: NOT DETECTED
Rhinovirus / Enterovirus: NOT DETECTED
Rhinovirus / Enterovirus: NOT DETECTED

## 2021-09-27 LAB — CBC WITH DIFFERENTIAL/PLATELET
Abs Immature Granulocytes: 0.02 10*3/uL (ref 0.00–0.07)
Basophils Absolute: 0.1 10*3/uL (ref 0.0–0.1)
Basophils Relative: 1 %
Eosinophils Absolute: 0 10*3/uL (ref 0.0–0.5)
Eosinophils Relative: 0 %
HCT: 41.1 % (ref 39.0–52.0)
Hemoglobin: 13.4 g/dL (ref 13.0–17.0)
Immature Granulocytes: 0 %
Lymphocytes Relative: 2 %
Lymphs Abs: 0.2 10*3/uL — ABNORMAL LOW (ref 0.7–4.0)
MCH: 33.6 pg (ref 26.0–34.0)
MCHC: 32.6 g/dL (ref 30.0–36.0)
MCV: 103 fL — ABNORMAL HIGH (ref 80.0–100.0)
Monocytes Absolute: 0.7 10*3/uL (ref 0.1–1.0)
Monocytes Relative: 7 %
Neutro Abs: 9 10*3/uL — ABNORMAL HIGH (ref 1.7–7.7)
Neutrophils Relative %: 90 %
Platelets: 219 10*3/uL (ref 150–400)
RBC: 3.99 MIL/uL — ABNORMAL LOW (ref 4.22–5.81)
RDW: 14.3 % (ref 11.5–15.5)
WBC: 9.9 10*3/uL (ref 4.0–10.5)
nRBC: 0 % (ref 0.0–0.2)

## 2021-09-27 LAB — COMPREHENSIVE METABOLIC PANEL
ALT: 21 U/L (ref 0–44)
AST: 38 U/L (ref 15–41)
Albumin: 2.9 g/dL — ABNORMAL LOW (ref 3.5–5.0)
Alkaline Phosphatase: 81 U/L (ref 38–126)
Anion gap: 9 (ref 5–15)
BUN: 12 mg/dL (ref 8–23)
CO2: 31 mmol/L (ref 22–32)
Calcium: 8.3 mg/dL — ABNORMAL LOW (ref 8.9–10.3)
Chloride: 100 mmol/L (ref 98–111)
Creatinine, Ser: 0.97 mg/dL (ref 0.61–1.24)
GFR, Estimated: >60 mL/min (ref >60)
Glucose, Bld: 189 mg/dL — ABNORMAL HIGH (ref 70–99)
Potassium: 3.7 mmol/L (ref 3.5–5.1)
Sodium: 140 mmol/L (ref 135–145)
Total Bilirubin: 0.7 mg/dL (ref 0.3–1.2)
Total Protein: 7 g/dL (ref 6.5–8.1)

## 2021-09-27 LAB — MRSA NEXT GEN BY PCR, NASAL: MRSA by PCR Next Gen: NOT DETECTED

## 2021-09-27 LAB — STREP PNEUMONIAE URINARY ANTIGEN: Strep Pneumo Urinary Antigen: NEGATIVE

## 2021-09-27 LAB — PREALBUMIN: Prealbumin: 8 mg/dL — ABNORMAL LOW (ref 18–38)

## 2021-09-27 LAB — EXPECTORATED SPUTUM ASSESSMENT W GRAM STAIN, RFLX TO RESP C

## 2021-09-27 LAB — LACTIC ACID, PLASMA: Lactic Acid, Venous: 1.5 mmol/L (ref 0.5–1.9)

## 2021-09-27 LAB — OSMOLALITY, URINE: Osmolality, Ur: 561 mOsm/kg (ref 300–900)

## 2021-09-27 LAB — ETHANOL: Alcohol, Ethyl (B): <10 mg/dL (ref <10)

## 2021-09-27 LAB — TSH: TSH: 0.225 u[IU]/mL — ABNORMAL LOW (ref 0.350–4.500)

## 2021-09-27 MED ORDER — HYDROCODONE-ACETAMINOPHEN 5-325 MG PO TABS: 1.0000 | ORAL_TABLET | ORAL | Status: DC | PRN

## 2021-09-27 MED ORDER — PREDNISONE 20 MG PO TABS
40.0000 mg | ORAL_TABLET | Freq: Every day | ORAL | Status: AC
  Administered 2021-09-28 – 2021-10-01 (×4): 40 mg via ORAL
  Filled 2021-09-27 (×4): qty 2

## 2021-09-27 MED ORDER — ORAL CARE MOUTH RINSE
15.0000 mL | OROMUCOSAL | Status: DC
  Administered 2021-09-27 – 2021-10-01 (×14): 15 mL via OROMUCOSAL

## 2021-09-27 MED ORDER — CHLORHEXIDINE GLUCONATE CLOTH 2 % EX PADS
6.0000 | MEDICATED_PAD | Freq: Every day | CUTANEOUS | Status: DC
Start: 2021-09-27 — End: 2021-10-01
  Administered 2021-09-27 – 2021-09-28 (×2): 6 via TOPICAL

## 2021-09-27 MED ORDER — METHYLPREDNISOLONE SODIUM SUCC 40 MG IJ SOLR
40.0000 mg | Freq: Two times a day (BID) | INTRAMUSCULAR | Status: AC
  Administered 2021-09-27 (×2): 40 mg via INTRAVENOUS
  Filled 2021-09-27 (×2): qty 1

## 2021-09-27 MED ORDER — DONEPEZIL HCL 5 MG PO TABS
5.0000 mg | ORAL_TABLET | Freq: Every day | ORAL | Status: DC
Start: 2021-09-27 — End: 2021-10-01
  Administered 2021-09-27 – 2021-09-30 (×4): 5 mg via ORAL
  Filled 2021-09-27 (×4): qty 1

## 2021-09-27 MED ORDER — IPRATROPIUM-ALBUTEROL 0.5-2.5 (3) MG/3ML IN SOLN
3.0000 mL | Freq: Four times a day (QID) | RESPIRATORY_TRACT | Status: DC
  Administered 2021-09-27 (×2): 3 mL via RESPIRATORY_TRACT
  Filled 2021-09-27 (×2): qty 3

## 2021-09-27 MED ORDER — ARFORMOTEROL TARTRATE 15 MCG/2ML IN NEBU
15.0000 ug | INHALATION_SOLUTION | Freq: Two times a day (BID) | RESPIRATORY_TRACT | Status: DC
  Administered 2021-09-27 – 2021-10-01 (×9): 15 ug via RESPIRATORY_TRACT
  Filled 2021-09-27 (×9): qty 2

## 2021-09-27 MED ORDER — NICOTINE POLACRILEX 2 MG MT GUM
2.0000 mg | CHEWING_GUM | OROMUCOSAL | Status: DC | PRN
  Administered 2021-09-27: 2 mg via ORAL
  Filled 2021-09-27 (×2): qty 1

## 2021-09-27 MED ORDER — SODIUM CHLORIDE 0.9 % IV SOLN: INTRAVENOUS | Status: AC

## 2021-09-27 MED ORDER — ACETAMINOPHEN 325 MG PO TABS
650.0000 mg | ORAL_TABLET | Freq: Four times a day (QID) | ORAL | Status: DC | PRN
  Filled 2021-09-27: qty 2

## 2021-09-27 MED ORDER — SODIUM CHLORIDE 0.9 % IV SOLN
500.0000 mg | INTRAVENOUS | Status: DC
  Administered 2021-09-27 – 2021-09-28 (×2): 500 mg via INTRAVENOUS
  Filled 2021-09-27 (×3): qty 5

## 2021-09-27 MED ORDER — REVEFENACIN 175 MCG/3ML IN SOLN
175.0000 ug | Freq: Every day | RESPIRATORY_TRACT | Status: DC
  Administered 2021-09-27 – 2021-10-01 (×5): 175 ug via RESPIRATORY_TRACT
  Filled 2021-09-27 (×6): qty 3

## 2021-09-27 MED ORDER — GUAIFENESIN ER 600 MG PO TB12
600.0000 mg | ORAL_TABLET | Freq: Two times a day (BID) | ORAL | Status: DC
  Administered 2021-09-27 – 2021-10-01 (×10): 600 mg via ORAL
  Filled 2021-09-27 (×10): qty 1

## 2021-09-27 MED ORDER — ENSURE ENLIVE PO LIQD: 237.0000 mL | Freq: Two times a day (BID) | ORAL | Status: DC

## 2021-09-27 MED ORDER — ORAL CARE MOUTH RINSE
15.0000 mL | OROMUCOSAL | Status: DC | PRN
Start: 2021-09-27 — End: 2021-10-01

## 2021-09-27 MED ORDER — ALBUTEROL SULFATE (2.5 MG/3ML) 0.083% IN NEBU: 2.5000 mg | INHALATION_SOLUTION | RESPIRATORY_TRACT | Status: DC | PRN

## 2021-09-27 MED ORDER — ENOXAPARIN SODIUM 40 MG/0.4ML IJ SOSY
40.0000 mg | PREFILLED_SYRINGE | INTRAMUSCULAR | Status: DC
  Filled 2021-09-27 (×2): qty 0.4

## 2021-09-27 MED ORDER — LATANOPROST 0.005 % OP SOLN
1.0000 [drp] | Freq: Every day | OPHTHALMIC | Status: DC
Start: 2021-09-27 — End: 2021-10-01
  Administered 2021-09-27 – 2021-09-30 (×4): 1 [drp] via OPHTHALMIC
  Filled 2021-09-27: qty 2.5

## 2021-09-27 MED ORDER — CEFTRIAXONE SODIUM 2 G IJ SOLR
2.0000 g | INTRAMUSCULAR | Status: DC
Start: 2021-09-27 — End: 2021-09-28
  Administered 2021-09-27: 2 g via INTRAVENOUS
  Filled 2021-09-27: qty 20

## 2021-09-27 MED ORDER — ACETAMINOPHEN 650 MG RE SUPP: 650.0000 mg | Freq: Four times a day (QID) | RECTAL | Status: DC | PRN

## 2021-09-27 MED ORDER — BUDESONIDE 0.25 MG/2ML IN SUSP
0.2500 mg | Freq: Two times a day (BID) | RESPIRATORY_TRACT | Status: DC
  Administered 2021-09-27 – 2021-10-01 (×9): 0.25 mg via RESPIRATORY_TRACT
  Filled 2021-09-27 (×9): qty 2

## 2021-09-27 NOTE — Consult Note (Signed)
NAME:  Travis Hicks, MRN:  [id number], DOB:  02/03/1951, LOS: 1 ADMISSION DATE:  09/26/2021, CONSULTATION DATE:  09/27/2021 REFERRING MD:  Dr. Darrick Meigs , CHIEF COMPLAINT:  Pulmonary consult    History of Present Illness:  Travis Hicks is a 71 y.o. male with a PMH significant for COPD, tobacco use  overactive bladder, and ETOH use who presented to the ED 8/7 for complaints of shortness of breath and hypoxia from PCP office.  Of note at PCP office patient had saturations as low as 70% on room air.  She is not on oxygen at baseline.  Patient also reported generalized fatigue and increased weight loss greater than 21 pounds over the last months.   On admission to the ED patient was seen tachypneic, tachycardic, and mildly hypoxic requiring application supplemental oxygen.  Lab work on admission revealed a potassium of 2.9, chloride 96, creatinine 1.38, phosphorus 4.7, albumin 3.1, AST 47, total bilirubin 1.6, lactic acid 2.9, hemoglobin 10.9.  Chest x-ray on admission positive for right upper lobe pneumonia.  CTA chest negative for acute PE Admitted per [facility] service for management of likely CAP and COPD exacerbation.  PCCM consulted for pulmonary needs 8/8.  Pertinent  Medical History  COPD, tobacco use  overactive bladder, and ETOH  Significant Hospital Events: Including procedures, antibiotic start and stop dates in addition to other pertinent events   8/7 admitted from outpatient PCP office with complaints of shortness of breath and hypoxia.  Evaluation in ED revealed CAP and likely COPD exacerbation 8/8 PCCM consulted  Interim History / Subjective:  Lying in bed with no acute complaints.   Objective   Blood pressure (!) 136/93, pulse 95, temperature 98.5 F (36.9 C), temperature source Oral, resp. rate (!) 23, height '6\' 1"'$  (1.854 m), weight 68.7 kg, SpO2 95 %.        Intake/Output Summary (Last 24 hours) at 09/27/2021 6378 Last data filed at 09/27/2021 0500 Gross per 24 hour  Intake 2934.21 ml   Output --  Net 2934.21 ml   Filed Weights   09/26/21 1543 09/27/21 0300  Weight: 65.8 kg 68.7 kg    Examination: General: Acute on chronic mildly deconditioned elderly male lying in bed in no acute distress  HEENT: [place]/AT, MM pink/moist, PERRL,  Neuro: Alert and oriented x3, nonfocal CV: s1s2 regular rate and rhythm, no murmur, rubs, or gallops,  PULM: Slightly diminished bilateral breath sounds, very faint expiratory wheeze, currently on 8 L nasal cannula, no increased work of breathing GI: soft, bowel sounds active in all 4 quadrants, non-tender, non-distended, tolerating oral diet Extremities: warm/dry, no edema  Skin: no rashes or lesions  Resolved Hospital Problem list     Assessment & Plan:  Community acquired pneumonia, present on admission  -Chest x-ray on admission positive for right upper lobe pneumonia.  CTA chest negative for acute PE. WBC 10.9 on admit. Does not appear to see a pulmonologist at baseline  -RVP negative  -Strep pneumoniae negative, Legionella pneumonia pending Hx of COPD in acute exacerbation on admit  -Required application of BiPAP on ED admission Chronic tobacco abuse  -Reports 1 pack/day tobacco use P: Wean supplemental oxygen to maintain SpO2 of 88-92% Add Yupelri, Brovana, and Pulmicort Continue as needed albuterol nebs Continue steroids, taper from IV to p.o. in place Remains on empiric azithromycin and ceftriaxone Follow cultures Intermittently trend chest x-ray and/or ABG BIPAP as needed Smoking cessation education Appropriate vaccinations prior to discharge  Would benefit from establishing with pulmonologist upon discharge  Best Practice (right click and "Reselect all SmartList Selections" daily)   Diet/type: Regular consistency (see orders) DVT prophylaxis: SCD GI prophylaxis: N/A Lines: N/A Foley:  N/A Code Status:  full code Last date of multidisciplinary goals of care discussion: Per primary    Labs   CBC: Recent Labs   Lab 09/26/21 1549 09/27/21 0608  WBC 10.9* 9.9  NEUTROABS 9.2* PENDING  HGB 14.2 13.4  HCT 41.9 41.1  MCV 100.5* 103.0*  PLT 264 294    Basic Metabolic Panel: Recent Labs  Lab 09/26/21 1549 09/27/21 0608  NA 140 140  K 2.9* 3.7  CL 96* 100  CO2 29 31  GLUCOSE 199* 189*  BUN 16 12  CREATININE 1.38* 0.97  CALCIUM 8.9 8.3*  MG 1.9  --   PHOS 4.7*  --    GFR: Estimated Creatinine Clearance: 67.9 mL/min (by C-G formula based on SCr of 0.97 mg/dL). Recent Labs  Lab 09/26/21 1549 09/26/21 1814 09/26/21 2150 09/26/21 2359 09/27/21 0608  PROCALCITON 0.32  --   --   --   --   WBC 10.9*  --   --   --  9.9  LATICACIDVEN 2.9* 2.0* 1.7 1.5  --     Liver Function Tests: Recent Labs  Lab 09/26/21 1549 09/27/21 0608  AST 47*  46* 38  ALT '22  23 21  '$ ALKPHOS 93  96 81  BILITOT 1.6*  1.6* 0.7  PROT 7.8  7.7 7.0  ALBUMIN 3.1*  3.2* 2.9*   No results for input(s): "LIPASE", "AMYLASE" in the last 168 hours. No results for input(s): "AMMONIA" in the last 168 hours.  ABG    Component Value Date/Time   HCO3 32.6 (H) 09/26/2021 2140   O2SAT 86.6 09/26/2021 2140     Coagulation Profile: Recent Labs  Lab 09/26/21 2150  INR 0.9    Cardiac Enzymes: Recent Labs  Lab 09/26/21 1549  CKTOTAL 157    HbA1C: No results found for: "HGBA1C"  CBG: No results for input(s): "GLUCAP" in the last 168 hours.  Review of Systems:   Please see the history of present illness. All other systems reviewed and are negative   Past Medical History:  He,  has a past medical history of Cataract, Emphysema of lung ([place]), Glaucoma (2018), and Overactive bladder.   Surgical History:   Past Surgical History:  Procedure Laterality Date   COLONOSCOPY     10-10-02,11-21-2005   DENTAL SURGERY     had 14 teeth pulled    HAND SURGERY Right    POLYPECTOMY     10-10-02,11-21-05     Social History:   reports that he has been smoking cigarettes. He has been smoking an average of 1  pack per day. He has never used smokeless tobacco. He reports that he does not currently use alcohol. He reports that he does not use drugs.   Family History:  His family history includes Glaucoma in his brother; Pancreatic cancer in his father. There is no history of Colon cancer, Rectal cancer, Stomach cancer, Esophageal cancer, Prostate cancer, or Colon polyps.   Allergies Allergies  Allergen Reactions   Viagra [Sildenafil Citrate]      Home Medications  Prior to Admission medications   Medication Sig Start Date End Date Taking? Authorizing Provider  b complex vitamins tablet Take 1 tablet by mouth daily. 01/14/13  Yes Plotnikov, Evie Lacks, MD  Cholecalciferol (VITAMIN D3) 50 MCG (2000 UT) capsule Take 2 capsules (4,000 Units total) by  mouth daily. 03/11/20   Plotnikov, Evie Lacks, MD  donepezil (ARICEPT) 5 MG tablet Take 1 tablet (5 mg total) by mouth at bedtime. 03/09/21   Plotnikov, Evie Lacks, MD  latanoprost (XALATAN) 0.005 % ophthalmic solution  12/01/16   [provider]  triamcinolone ointment (KENALOG) 0.5 % Apply 1 application topically 3 (three) times daily. 11/09/20 11/09/21  Plotnikov, Evie Lacks, MD  umeclidinium-vilanterol (ANORO ELLIPTA) 62.5-25 MCG/ACT AEPB Inhale 1 puff into the lungs daily. 02/02/21   Plotnikov, Evie Lacks, MD     Critical care time: NA  Eliyah Mcshea D. Kenton Kingfisher, NP-C [facility] Personal contact information can be found on Amion  09/27/2021, 9:34 AM

## 2021-09-27 NOTE — Evaluation (Signed)
Physical Therapy Evaluation Patient Details Name: Travis Hicks MRN: [id number] DOB: [birth year] Today's Date: 09/27/2021  History of Present Illness  Travis Hicks is a 71 y.o. male with a PMH significant for COPD, tobacco use  overactive bladder, and ETOH use who presented to the ED 8/7 for complaints of shortness of breath and hypoxia from PCP office.Chest x-ray on admission positive for right upper lobe pneumonia.  CTA chest negative for acute PE  Clinical Impression  Patient admitted for above. Patient resides with wife, Independent, states that he recently has had limited ability to perform yard work/mowing lawn.  Patient ambulated on 8 L HFNC, spo2 dropped to  88%, resting SPO2 >94%.. HR 107.  Pt admitted with above diagnosis.  Pt currently with functional limitations due to the deficits listed below (see PT Problem List). Pt will benefit from skilled PT to increase their independence and safety with mobility to allow discharge to the venue listed below.        Recommendations for follow up therapy are one component of a multi-disciplinary discharge planning process, led by the attending physician.  Recommendations may be updated based on patient status, additional functional criteria and insurance authorization.  Follow Up Recommendations No PT follow up      Assistance Recommended at Discharge PRN  Patient can return home with the following       Equipment Recommendations None recommended by PT  Recommendations for Other Services       Functional Status Assessment Patient has had a recent decline in their functional status and demonstrates the ability to make significant improvements in function in a reasonable and predictable amount of time.     Precautions / Restrictions Precautions Precaution Comments: monitor sats      Mobility  Bed Mobility               General bed mobility comments: in recliner    Transfers Overall transfer level: Needs  assistance Equipment used: None Transfers: Sit to/from Stand Sit to Stand: Supervision                Ambulation/Gait Ambulation/Gait assistance: Supervision Gait Distance (Feet): 80 Feet Assistive device: None Gait Pattern/deviations: WFL(Within Functional Limits)          Stairs            Wheelchair Mobility    Modified Rankin (Stroke Patients Only)       Balance Overall balance assessment: No apparent balance deficits (not formally assessed)                                           Pertinent Vitals/Pain Pain Assessment Pain Assessment: No/denies pain    Home Living Family/patient expects to be discharged to:: Private residence Living Arrangements: Spouse/significant other;Other relatives Available Help at Discharge: Family Type of Home: House Home Access: Stairs to enter Entrance Stairs-Rails: Right Entrance Stairs-Number of Steps: 3-4   Home Layout: One level Home Equipment: Shower seat;Grab bars - tub/shower      Prior Function Prior Level of Function : Independent/Modified Independent             Mobility Comments: driving, mowing  w/riding mower until recentl       Hand Dominance        Extremity/Trunk Assessment        Lower Extremity Assessment Lower Extremity Assessment: Overall WFL for tasks assessed  Cervical / Trunk Assessment Cervical / Trunk Assessment: Normal  Communication   Communication: No difficulties  Cognition Arousal/Alertness: Awake/alert Behavior During Therapy: WFL for tasks assessed/performed Overall Cognitive Status: Within Functional Limits for tasks assessed                                          General Comments      Exercises     Assessment/Plan    PT Assessment Patient needs continued PT services  PT Problem List Decreased strength;Decreased mobility;Decreased activity tolerance;Decreased balance;Cardiopulmonary status limiting activity        PT Treatment Interventions DME instruction;Therapeutic activities;Gait training;Therapeutic exercise;Patient/family education;Functional mobility training    PT Goals (Current goals can be found in the Care Plan section)  Acute Rehab PT Goals Patient Stated Goal: have a cigarette, go home and mow PT Goal Formulation: With patient Time For Goal Achievement: 10/11/21 Potential to Achieve Goals: Good    Frequency Min 3X/week     Co-evaluation               AM-PAC PT "6 Clicks" Mobility  Outcome Measure Help needed turning from your back to your side while in a flat bed without using bedrails?: None Help needed moving from lying on your back to sitting on the side of a flat bed without using bedrails?: None Help needed moving to and from a bed to a chair (including a wheelchair)?: A Little Help needed standing up from a chair using your arms (e.g., wheelchair or bedside chair)?: A Little Help needed to walk in hospital room?: A Little Help needed climbing 3-5 steps with a railing? : A Little 6 Click Score: 20    End of Session Equipment Utilized During Treatment: Oxygen Activity Tolerance: Patient tolerated treatment well Patient left: in chair;with call bell/phone within reach Nurse Communication: Mobility status PT Visit Diagnosis: Unsteadiness on feet (R26.81)    Time: 3790-2409 PT Time Calculation (min) (ACUTE ONLY): 20 min   Charges:   PT Evaluation $PT Eval Low Complexity: 1 Low          [place] Office [phone] Weekend pager-[phone]   Claretha Cooper 09/27/2021, 12:38 PM

## 2021-09-27 NOTE — Progress Notes (Addendum)
[facility]  PROGRESS NOTE  Travis Hicks GEX:[id number] DOB: 18-Sep-1950 DOA: 09/26/2021 PCP: Cassandria Anger, MD   Brief HPI:   71 year old male with medical history of COPD, tobacco abuse, alcohol abuse, glaucoma presented with shortness of breath and hypoxemia from pulmonology office.  At the pulmonology office he was found to be hypoxemic with O2 sats 70% on room air.  He was placed on 3 L/min of oxygen and was transferred to the ED.  In the ED chest x-ray showed right upper lobe pneumonia.  CTA chest showed no pulmonary embolism, findings suggestive of atypical infection, mediastinal adenopathy.    Subjective   Patient this morning says that he is breathing better.  Denies chest pain.   Assessment/Plan:    Acute hypoxemic respiratory failure -Secondary to COPD exacerbation and possible community-acquired pneumonia -Patient required BiPAP last night -Currently requiring oxygen 7 to 8 L/min -Continue albuterol as needed, steroid taper -Started on Rocephin and Zithromax -Continue BiPAP nightly as needed -Continue albuterol as needed -Continue scheduled DuoNeb -PCCM consulted  COPD exacerbation -Started on steroid taper as above -Continue BiPAP nightly -VBG showed pH 7.35, pCO2 59 -Likely has some degree of chronic CO2 retention  ?  Community-acquired pneumonia -CT chest showed atypical pulmonary infection -Started on ceftriaxone and Zithromax -Will need repeat CT chest after resolution of symptoms to check for clearance as outpatient  Hypokalemia -Replete  Acute kidney injury -Likely from dehydration -Resolved  Dementia -No behavior disturbance -Continue Aricept  Mediastinal lymphadenopathy -Will need follow-up with pulmonology as outpatient      Medications     arformoterol  15 mcg Nebulization BID   budesonide (PULMICORT) nebulizer solution  0.25 mg Nebulization BID   Chlorhexidine Gluconate Cloth  6 each Topical Daily   ciprofloxacin  2  drop Left Eye Q4H while awake   donepezil  5 mg Oral QHS   feeding supplement  237 mL Oral BID BM   guaiFENesin  600 mg Oral BID   latanoprost  1 drop Both Eyes QHS   nicotine  14 mg Transdermal Daily   mouth rinse  15 mL Mouth Rinse 4 times per day   [START ON 09/28/2021] predniSONE  40 mg Oral Q breakfast   revefenacin  175 mcg Nebulization Daily     Data Reviewed:   CBG:  No results for input(s): "GLUCAP" in the last 168 hours.  SpO2: 98 % O2 Flow Rate (L/min): 7 L/min    Vitals:   09/27/21 1100 09/27/21 1200 09/27/21 1400 09/27/21 1600  BP: (!) 123/90 (!) 127/98 134/87 (!) 134/95  Pulse: (!) 112 96 97 91  Resp: (!) 27 (!) 22 (!) 29 (!) 29  Temp:  97.6 F (36.4 C)    TempSrc:  Oral    SpO2: 97% 93% 96% 98%  Weight:      Height:          Data Reviewed:  Basic Metabolic Panel: Recent Labs  Lab 09/26/21 1549 09/27/21 0608  NA 140 140  K 2.9* 3.7  CL 96* 100  CO2 29 31  GLUCOSE 199* 189*  BUN 16 12  CREATININE 1.38* 0.97  CALCIUM 8.9 8.3*  MG 1.9  --   PHOS 4.7*  --     CBC: Recent Labs  Lab 09/26/21 1549 09/27/21 0608  WBC 10.9* 9.9  NEUTROABS 9.2* 9.0*  HGB 14.2 13.4  HCT 41.9 41.1  MCV 100.5* 103.0*  PLT 264 219    LFT Recent Labs  Lab 09/26/21  1549 09/27/21 0608  AST 47*  46* 38  ALT '22  23 21  '$ ALKPHOS 93  96 81  BILITOT 1.6*  1.6* 0.7  PROT 7.8  7.7 7.0  ALBUMIN 3.1*  3.2* 2.9*     Antibiotics: Anti-infectives (From admission, onward)    Start     Dose/Rate Route Frequency Ordered Stop   09/27/21 1600  cefTRIAXone (ROCEPHIN) 2 g in sodium chloride 0.9 % 100 mL IVPB        2 g 200 mL/hr over 30 Minutes Intravenous Every 24 hours 09/27/21 0256     09/27/21 1600  azithromycin (ZITHROMAX) 500 mg in sodium chloride 0.9 % 250 mL IVPB        500 mg 250 mL/hr over 60 Minutes Intravenous Every 24 hours 09/27/21 0256     09/26/21 1630  cefTRIAXone (ROCEPHIN) 2 g in sodium chloride 0.9 % 100 mL IVPB        2 g 200 mL/hr over 30  Minutes Intravenous  Once 09/26/21 1618 09/26/21 1800   09/26/21 1630  azithromycin (ZITHROMAX) 500 mg in sodium chloride 0.9 % 250 mL IVPB        500 mg 250 mL/hr over 60 Minutes Intravenous  Once 09/26/21 1618 09/26/21 1800        DVT prophylaxis: Lovenox  Code Status: Full code  Family Communication: No family at bedside   CONSULTS PCCM   Objective    Physical Examination:   General-appears in no acute distress Heart-S1-S2, regular, no murmur auscultated Lungs-bilateral rhonchi auscultated Abdomen-soft, nontender, no organomegaly Extremities-no edema in the lower extremities Neuro-alert, oriented x3, no focal deficit noted  Status is: Inpatient: COPD exacerbation           [place]   [facility] If 7PM-7AM, please contact night-coverage at [email or web address], Office  [phone]   09/27/2021, 4:37 PM  LOS: 1 day

## 2021-09-27 NOTE — Evaluation (Signed)
Occupational Therapy Evaluation Patient Details Name: Travis Hicks MRN: [id number] DOB: 1951/01/31 Today's Date: 09/27/2021   History of Present Illness Travis Hicks is a 71 y.o. male with a PMH significant for COPD, tobacco use  overactive bladder, and ETOH use who presented to the ED 8/7 for complaints of shortness of breath and hypoxia from PCP office.Chest x-ray on admission positive for right upper lobe pneumonia.  CTA chest negative for acute PE   Clinical Impression   Patient is a 71 year old male who was admitted for above. Patient reported living at home with wife with independence in ADLs. Patient was noted to require O2 with patient dropping to 88% on 8L/min HFNC. Patient was noted to have decreased functional activity tolerance, decreased endurance, decreased standing balance, decreased safety awareness, and decreased knowledge of AD/AE impacting participation in ADLs. Patient would continue to benefit from skilled OT services at this time while admitted and after d/c to address noted deficits in order to improve overall safety and independence in ADLs.       Recommendations for follow up therapy are one component of a multi-disciplinary discharge planning process, led by the attending physician.  Recommendations may be updated based on patient status, additional functional criteria and insurance authorization.   Follow Up Recommendations  No OT follow up    Assistance Recommended at Discharge Frequent or constant Supervision/Assistance  Patient can return home with the following A little help with bathing/dressing/bathroom;Assistance with cooking/housework;Direct supervision/assist for financial management;Assist for transportation;Help with stairs or ramp for entrance;Direct supervision/assist for medications management    Functional Status Assessment  Patient has had a recent decline in their functional status and demonstrates the ability to make significant improvements in  function in a reasonable and predictable amount of time.  Equipment Recommendations  None recommended by OT    Recommendations for Other Services       Precautions / Restrictions Precautions Precaution Comments: monitor sats Restrictions Weight Bearing Restrictions: No      Mobility Bed Mobility               General bed mobility comments: in recliner and returned to the same    Transfers                          Balance Overall balance assessment: No apparent balance deficits (not formally assessed)                                         ADL either performed or assessed with clinical judgement   ADL Overall ADL's : Needs assistance/impaired Eating/Feeding: Set up;Sitting   Grooming: Set up;Sitting   Upper Body Bathing: Sitting;Set up   Lower Body Bathing: Minimal assistance;Sit to/from stand   Upper Body Dressing : Sitting;Set up   Lower Body Dressing: Minimal assistance;Sit to/from stand   Toilet Transfer: Minimal assistance;Ambulation;Rolling walker (2 wheels) Toilet Transfer Details (indicate cue type and reason): with increased time to take some steps into hallway and back into room. Toileting- Clothing Manipulation and Hygiene: Minimal assistance;Sit to/from stand               Vision Patient Visual Report: No change from baseline       Perception     Praxis      Pertinent Vitals/Pain Pain Assessment Pain Assessment: No/denies pain     Hand Dominance  Extremity/Trunk Assessment Upper Extremity Assessment Upper Extremity Assessment: Overall WFL for tasks assessed   Lower Extremity Assessment Lower Extremity Assessment: Defer to PT evaluation   Cervical / Trunk Assessment Cervical / Trunk Assessment: Normal   Communication Communication Communication: No difficulties   Cognition Arousal/Alertness: Awake/alert Behavior During Therapy: WFL for tasks assessed/performed Overall Cognitive Status:  Within Functional Limits for tasks assessed                                       General Comments       Exercises     Shoulder Instructions      Home Living Family/patient expects to be discharged to:: Private residence Living Arrangements: Spouse/significant other;Other relatives Available Help at Discharge: Family Type of Home: House Home Access: Stairs to enter [employer] of Steps: 3-4 Entrance Stairs-Rails: Right Home Layout: One level     Bathroom Shower/Tub: Walk-in shower         Home Equipment: Shower seat;Grab bars - tub/shower          Prior Functioning/Environment Prior Level of Function : Independent/Modified Independent             Mobility Comments: driving, mowing  w/riding mower until recentl          OT Problem List: Decreased activity tolerance;Impaired balance (sitting and/or standing);Decreased safety awareness;Cardiopulmonary status limiting activity;Decreased knowledge of precautions;Decreased knowledge of use of DME or AE      OT Treatment/Interventions: Self-care/ADL training;Therapeutic exercise;Neuromuscular education;Energy conservation;DME and/or AE instruction;Therapeutic activities;Balance training;Patient/family education    OT Goals(Current goals can be found in the care plan section) Acute Rehab OT Goals Patient Stated Goal: to get off O2 OT Goal Formulation: With patient Time For Goal Achievement: 10/11/21 Potential to Achieve Goals: Fair  OT Frequency: Min 2X/week    Co-evaluation PT/OT/SLP Co-Evaluation/Treatment: Yes Reason for Co-Treatment: To address functional/ADL transfers PT goals addressed during session: Mobility/safety with mobility OT goals addressed during session: ADL's and self-care      AM-PAC OT "6 Clicks" Daily Activity     Outcome Measure Help from another person eating meals?: None Help from another person taking care of personal grooming?: A Little Help from another  person toileting, which includes using toliet, bedpan, or urinal?: A Little Help from another person bathing (including washing, rinsing, drying)?: A Lot Help from another person to put on and taking off regular upper body clothing?: A Little Help from another person to put on and taking off regular lower body clothing?: A Little 6 Click Score: 18   End of Session Equipment Utilized During Treatment: Rolling walker (2 wheels)  Activity Tolerance: Patient tolerated treatment well Patient left: in chair;with call bell/phone within reach;with chair alarm set  OT Visit Diagnosis: Unsteadiness on feet (R26.81)                Time: 2836-6294 OT Time Calculation (min): 17 min Charges:  OT General Charges $OT Visit: 1 Visit OT Evaluation $OT Eval Moderate Complexity: 1 Mod  Jackelyn Poling OTR/L, MS Acute Rehabilitation Department Office# [phone] Pager# [phone]   Marcellina Millin 09/27/2021, 1:18 PM

## 2021-09-28 ENCOUNTER — Inpatient Hospital Stay (HOSPITAL_COMMUNITY): Payer: Medicare Other

## 2021-09-28 ENCOUNTER — Telehealth: Payer: Self-pay | Admitting: Critical Care Medicine

## 2021-09-28 DIAGNOSIS — Z720 Tobacco use: Secondary | ICD-10-CM | POA: Diagnosis not present

## 2021-09-28 DIAGNOSIS — J9601 Acute respiratory failure with hypoxia: Secondary | ICD-10-CM | POA: Diagnosis not present

## 2021-09-28 DIAGNOSIS — J441 Chronic obstructive pulmonary disease with (acute) exacerbation: Secondary | ICD-10-CM | POA: Diagnosis not present

## 2021-09-28 DIAGNOSIS — E43 Unspecified severe protein-calorie malnutrition: Secondary | ICD-10-CM | POA: Insufficient documentation

## 2021-09-28 DIAGNOSIS — J189 Pneumonia, unspecified organism: Secondary | ICD-10-CM | POA: Diagnosis not present

## 2021-09-28 LAB — BASIC METABOLIC PANEL
Anion gap: 10 (ref 5–15)
BUN: 18 mg/dL (ref 8–23)
CO2: 31 mmol/L (ref 22–32)
Calcium: 8.2 mg/dL — ABNORMAL LOW (ref 8.9–10.3)
Chloride: 99 mmol/L (ref 98–111)
Creatinine, Ser: 0.93 mg/dL (ref 0.61–1.24)
GFR, Estimated: >60 mL/min (ref >60)
Glucose, Bld: 158 mg/dL — ABNORMAL HIGH (ref 70–99)
Potassium: 3.5 mmol/L (ref 3.5–5.1)
Sodium: 140 mmol/L (ref 135–145)

## 2021-09-28 LAB — URINE CULTURE: Culture: NO GROWTH

## 2021-09-28 LAB — CBC
HCT: 36.2 % — ABNORMAL LOW (ref 39.0–52.0)
Hemoglobin: 11.6 g/dL — ABNORMAL LOW (ref 13.0–17.0)
MCH: 33.1 pg (ref 26.0–34.0)
MCHC: 32 g/dL (ref 30.0–36.0)
MCV: 103.4 fL — ABNORMAL HIGH (ref 80.0–100.0)
Platelets: 251 10*3/uL (ref 150–400)
RBC: 3.5 MIL/uL — ABNORMAL LOW (ref 4.22–5.81)
RDW: 14 % (ref 11.5–15.5)
WBC: 12.5 10*3/uL — ABNORMAL HIGH (ref 4.0–10.5)
nRBC: 0 % (ref 0.0–0.2)

## 2021-09-28 LAB — LEGIONELLA PNEUMOPHILA SEROGP 1 UR AG: L. pneumophila Serogp 1 Ur Ag: NEGATIVE

## 2021-09-28 LAB — T4, FREE: Free T4: 0.88 ng/dL (ref 0.61–1.12)

## 2021-09-28 MED ORDER — AMPICILLIN-SULBACTAM SODIUM 3 (2-1) G IJ SOLR
3.0000 g | Freq: Four times a day (QID) | INTRAMUSCULAR | Status: DC
  Administered 2021-09-28 – 2021-10-01 (×12): 3 g via INTRAVENOUS
  Filled 2021-09-28 (×14): qty 8

## 2021-09-28 MED ORDER — PIPERACILLIN-TAZOBACTAM 3.375 G IVPB: 3.3750 g | Freq: Three times a day (TID) | INTRAVENOUS | Status: DC

## 2021-09-28 MED ORDER — ADULT MULTIVITAMIN W/MINERALS CH
1.0000 | ORAL_TABLET | Freq: Every day | ORAL | Status: DC
  Administered 2021-09-28 – 2021-10-01 (×4): 1 via ORAL
  Filled 2021-09-28 (×4): qty 1

## 2021-09-28 MED ORDER — BISACODYL 10 MG RE SUPP
10.0000 mg | Freq: Once | RECTAL | Status: AC
  Administered 2021-09-28: 10 mg via RECTAL
  Filled 2021-09-28: qty 1

## 2021-09-28 NOTE — Progress Notes (Signed)
Initial Nutrition Assessment  DOCUMENTATION CODES:   Severe malnutrition in context of chronic illness  INTERVENTION:  - will liberalize diet from Heart Healthy to Regular. - will order 1 tablet multivitamin with minerals/day. - will order daily snacks at 1000 and 1400. - entered High Calorie, High Protein handout into AVS.    NUTRITION DIAGNOSIS:   Severe Malnutrition related to chronic illness (COPD) as evidenced by severe fat depletion, severe muscle depletion.  GOAL:   Patient will meet greater than or equal to 90% of their needs  MONITOR:   PO intake, Labs, Weight trends  REASON FOR ASSESSMENT:   Malnutrition Screening Tool, Consult Assessment of nutrition requirement/status  ASSESSMENT:   71 y.o. male with medical history of COPD, tobacco use, overactive bladder, and glaucoma. He presented to the ED on 8/7 from PCP's office due to shortness of breath and hypoxia. At PCP's office patient had saturations as low as 70% on room air. He is not on oxygen at baseline.  Patient also reported generalized fatigue and increased weight loss greater than 21 pounds over the few last months. He was admitted for CAP and COPD exacerbation.  Patient laying in bed with no visitors present at the time of RD visit. Patient shares that he has had a decreased appetite and early satiety for ~3 months. He shares that he has intermittent abdominal pain with PO intakes and that this sometimes limits desire to eat. It is rare for him to feel hungry.   He typically has 3-4 BMs/day but reports he has not had a BM since arriving to the hospital.   He has dentures and denies  them moving or being ill-fitting but reports that they have limited what he is able to comfortably consume and that he does often need to consume softer foods, specifically softer meats.  He was not experiencing any difficulty with ambulation PTA, no dizziness or lightheadedness, and he does not use anything like a walker or cane.  He does report increasing shortness of breath even with typical activities PTA.   Patient shares that he does not like oral nutrition supplements, protein shakes, smoothies, or milkshakes.   Weight yesterday was 151 lb and weight on 07/08/21 was 159 lb. This indicates 8 lb weight loss (5% body weight) in the past 3 months; not significant for time frame. Weight on 03/09/21 was 168 lb which indicates 17 lb weight loss (10% body weight) in the past 7 months; not significant for time frame.  Patient reports UBW of 165 lb. He shares that he had lost weight after dentures were fitted but then gained back up to ~165 lb and that he has lost 21 lb in the past several months.    Labs reviewed; Ca: 8.2 mg/dl. Medications reviewed; 40 mg deltasone/day 8/9-8/13.    NUTRITION - FOCUSED PHYSICAL EXAM:  Flowsheet Row Most Recent Value  Orbital Region Severe depletion  Upper Arm Region Severe depletion  Thoracic and Lumbar Region Moderate depletion  Buccal Region Moderate depletion  Temple Region Mild depletion  Clavicle Bone Region Severe depletion  Clavicle and Acromion Bone Region Moderate depletion  Scapular Bone Region Moderate depletion  Dorsal Hand Moderate depletion  Patellar Region Moderate depletion  Anterior Thigh Region Moderate depletion  Posterior Calf Region Severe depletion  Edema (RD Assessment) None  Hair Reviewed  Eyes Reviewed  Mouth Reviewed  Skin Reviewed  Nails Reviewed       Diet Order:   Diet Order  Diet regular Room service appropriate? Yes; Fluid consistency: Thin  Diet effective now                   EDUCATION NEEDS:   Education needs have been addressed  Skin:  Skin Assessment: Reviewed RN Assessment  Last BM:  PTA/unknown  Height:   Ht Readings from Last 1 Encounters:  09/27/21 '6\' 1"'$  (1.854 m)    Weight:   Wt Readings from Last 1 Encounters:  09/27/21 68.7 kg     BMI:  Body mass index is 19.98 kg/m.  Estimated  Nutritional Needs:  Kcal:  1900-2200 kcal Protein:  95-110 grams Fluid:  >/= 2.2 L/day     Travis Matin, MS, RD, LDN, CNSC Registered Dietitian II Inpatient Clinical Nutrition RD pager # and on-call/weekend pager # available in [facility]

## 2021-09-28 NOTE — Telephone Encounter (Signed)
Post-hospital follow up with Pulmonology has been requested for 2-4 weeks after discharge.  Julian Hy, DO 09/28/21 10:14 AM  Pulmonary & Critical Care

## 2021-09-28 NOTE — Progress Notes (Signed)
  Transition of Care [facility]) Screening Note   Patient Details  Name: Sopheap Boehle Date of Birth: [birth year]   [facility] of [facility]) CM/SW Contact:    Dessa Phi, RN Phone Number: 09/28/2021, 11:08 AM    Transition of Care Department [facility]) has reviewed patient and no TOC needs have been identified at this time. We will continue to monitor patient advancement through interdisciplinary progression rounds. If new patient transition needs arise, please place a TOC consult.

## 2021-09-28 NOTE — Progress Notes (Addendum)
NAME:  Travis Hicks, MRN:  [id number], DOB:  11/04/1950, LOS: 2 ADMISSION DATE:  09/26/2021, CONSULTATION DATE:  09/27/2021 REFERRING MD:  Dr. Darrick Meigs , CHIEF COMPLAINT:  Pulmonary consult    History of Present Illness:  Travis Hicks is a 71 y.o. male with a PMH significant for COPD, tobacco use  overactive bladder, and ETOH use who presented to the ED 8/7 for complaints of shortness of breath and hypoxia from PCP office.  Of note at PCP office patient had saturations as low as 70% on room air.  She is not on oxygen at baseline.  Patient also reported generalized fatigue and increased weight loss greater than 21 pounds over the last months.   On admission to the ED patient was seen tachypneic, tachycardic, and mildly hypoxic requiring application supplemental oxygen.  Lab work on admission revealed a potassium of 2.9, chloride 96, creatinine 1.38, phosphorus 4.7, albumin 3.1, AST 47, total bilirubin 1.6, lactic acid 2.9, hemoglobin 10.9.  Chest x-ray on admission positive for right upper lobe pneumonia.  CTA chest negative for acute PE Admitted per [facility] service for management of likely CAP and COPD exacerbation.  PCCM consulted for pulmonary needs 8/8.  Pertinent  Medical History  COPD, tobacco use  overactive bladder, and ETOH  Significant Hospital Events: Including procedures, antibiotic start and stop dates in addition to other pertinent events   8/7 admitted from outpatient PCP office with complaints of shortness of breath and hypoxia.  Evaluation in ED revealed CAP and likely COPD exacerbation 8/8 PCCM consulted 8/9 Did not require use of BIPAP overnight, personally weaned oxygen to 4L [place] with stats maintained at 98. Will encourage continued titration   Interim History / Subjective:  Seen early this am still slightly sleepy  Denies any acute complaints   Objective   Blood pressure 109/74, pulse 68, temperature 98.1 F (36.7 C), temperature source Oral, resp. rate 16, height '6\' 1"'$  (1.854 m),  weight 68.7 kg, SpO2 96 %.        Intake/Output Summary (Last 24 hours) at 09/28/2021 0707 Last data filed at 09/28/2021 0000 Gross per 24 hour  Intake 625.89 ml  Output 900 ml  Net -274.11 ml    Filed Weights   09/26/21 1543 09/27/21 0300  Weight: 65.8 kg 68.7 kg    Examination: General: Well appearing elderly male lying in bed, in NAD HEENT: [place]/AT, MM pink/moist, PERRL,  Neuro: Alert and oriented x3, non-focal  CV: s1s2 regular rate and rhythm, no murmur, rubs, or gallops,  PULM:  Clear to ascultation, no increased work of breathing, on 4L [place]  GI: soft, bowel sounds active in all 4 quadrants, non-tender, non-distended, tolerating oral diet  Extremities: warm/dry, no edema  Skin: no rashes or lesions  Resolved Hospital Problem list     Assessment & Plan:  Community acquired pneumonia, present on admission  -Chest x-ray on admission positive for right upper lobe pneumonia.  CTA chest negative for acute PE. WBC 10.9 on admit. Does not appear to see a pulmonologist at baseline  -RVP negative  -Strep pneumoniae negative, Legionella pneumonia pending Hx of COPD in acute exacerbation on admit  -Required application of BiPAP on ED admission Chronic tobacco abuse  -Reports 1 pack/day tobacco use P: Continue azithromycin and ceftriaxone, once culture resulted can de-escalate to oral coverage  Continue to wean supplemental oxygen for sat goal 88-91% Continue scheduled Yupelri, Brovana, and Pulmocort PRN Albuterol nebs  Continue steroid taper  Follow sputum culture  Smoking cessation education  provided  Would benefit from establishing with pulmonologist at discharge    PCCM will sign off. Thank you for the opportunity to participate in this patient's care. Please contact if we can be of further assistance.   Best Practice (right click and "Reselect all SmartList Selections" daily)   Diet/type: Regular consistency (see orders) DVT prophylaxis: SCD GI prophylaxis: N/A Lines:  N/A Foley:  N/A Code Status:  full code Last date of multidisciplinary goals of care discussion: Per primary     Critical care time: NA  Wanita Derenzo D. Kenton Kingfisher, NP-C [facility] Personal contact information can be found on Amion  09/28/2021, 7:07 AM

## 2021-09-28 NOTE — Discharge Instructions (Signed)

## 2021-09-28 NOTE — Telephone Encounter (Signed)
Patient is scheduled 10/20/2021 at 2pm with Dr. Erin Fulling- reminder mailed to address on file. Nothing further needed.

## 2021-09-28 NOTE — Evaluation (Signed)
SLP Cancellation Note  Patient Details Name: Travis Hicks MRN: [id number] DOB: [birth year]   Cancelled treatment:       Reason Eval/Treat Not Completed: Other (comment);Patient at procedure or test/unavailable (pt getting blood drawn at this time; will continue efforts)   Macario Golds 09/28/2021, 7:54 AM   Kathleen Lime, MS [place] [facility] [phone] Pager [phone]

## 2021-09-28 NOTE — Progress Notes (Addendum)
PROGRESS NOTE    Travis Hicks  KGM:[id number] DOB: 11-30-1950 DOA: 09/26/2021 PCP: Cassandria Anger, MD   Brief Narrative: 71 year old with past medical history significant for COPD, tobacco abuse, alcohol abuse, glaucoma presents with shortness of breath and hypoxia from pulmonology office.  He was found to be hypoxic with oxygen saturation 70% on room air.  He was placed on 3 L of oxygen and transferred to the ED.  Chest x-ray positive for right upper lobe pneumonia.  CTA chest negative for PE.  Findings suggestive of atypical infection, mediastinal adenopathy.     Assessment & Plan:   Principal Problem:   CAP (community acquired pneumonia) Active Problems:   Tobacco abuse   Hematuria   Glaucoma   Malnutrition ([place])   COPD exacerbation (HCC)   Hypokalemia   Dementia without behavioral disturbance (HCC)   Acute respiratory failure with hypoxia (HCC)   Sepsis (HCC)   History of ETOH abuse   AKI (acute kidney injury) ([place])   1-Acute Hypoxic respiratory failure: COPD exacerbation and  community-acquired pneumonia: Respiratory panel negative.  Strept Pneumonia negative. Pending Follow sputum culture.  On IV ceftriaxone and azithromycin. Change ceftriaxone to Unasyn.  Speech swallow for aspiration.  Continue with LABA LAMA ICS.  Patient can be discharged on Trelegy at discharge. On prednisone  Hypokalemia: Replaced.   AKI: Received IV fluids.  Resolved  Dementia: Continue with Aricept  Mediastinal lymphadenopathy: CT follow-up  Constipation;  Report feeling abdominal fullness. No passing gas.  KUB: Nondescript loop of air distended bowel within the right hemiabdomen, measuring 4.9 cm in diameter.  Will proceed with CT abdomen.   Severe malnutrition in context of chronic illness.  Started on supplement.     Estimated body mass index is 19.98 kg/m as calculated from the following:   Height as of this encounter: '6\' 1"'$  (1.854 m).   Weight as of this  encounter: 68.7 kg.   DVT prophylaxis: Lovenox Code Status: Full code Family Communication: care discussed with patient.  Disposition Plan:  Status is: Inpatient Remains inpatient appropriate because: resp failure and PNA    Consultants:  Pulmonologist   Procedures:    Antimicrobials:    Subjective: He report cough, SOB. Feel fair.   Objective: Vitals:   09/28/21 0400 09/28/21 0500 09/28/21 0600 09/28/21 0700  BP: 131/75 119/84 109/74 108/77  Pulse: 82 (!) 56 68 63  Resp: (!) '25 15 16 14  '$ Temp:      TempSrc:      SpO2: 100% 99% 96% 97%  Weight:      Height:        Intake/Output Summary (Last 24 hours) at 09/28/2021 0720 Last data filed at 09/28/2021 0300 Gross per 24 hour  Intake 625.89 ml  Output 1100 ml  Net -474.11 ml   Filed Weights   09/26/21 1543 09/27/21 0300  Weight: 65.8 kg 68.7 kg    Examination:  General exam: Appears calm and comfortable  Respiratory system: BL ronchus Cardiovascular system: S1 & S2 heard, RRR.  Gastrointestinal system: Abdomen is nondistended, soft and nontender. No organomegaly or masses felt. Normal bowel sounds heard. Central nervous system: Alert and oriented. No focal neurological deficits. Extremities: Symmetric 5 x 5 power.    Data Reviewed: I have personally reviewed following labs and imaging studies  CBC: Recent Labs  Lab 09/26/21 1549 09/27/21 0608 09/28/21 0249  WBC 10.9* 9.9 12.5*  NEUTROABS 9.2* 9.0*  --   HGB 14.2 13.4 11.6*  HCT 41.9 41.1 36.2*  MCV 100.5* 103.0* 103.4*  PLT 264 219 680   Basic Metabolic Panel: Recent Labs  Lab 09/26/21 1549 09/27/21 0608 09/28/21 0249  NA 140 140 140  K 2.9* 3.7 3.5  CL 96* 100 99  CO2 '29 31 31  '$ GLUCOSE 199* 189* 158*  BUN '16 12 18  '$ CREATININE 1.38* 0.97 0.93  CALCIUM 8.9 8.3* 8.2*  MG 1.9  --   --   PHOS 4.7*  --   --    GFR: Estimated Creatinine Clearance: 70.8 mL/min (by C-G formula based on SCr of 0.93 mg/dL). Liver Function Tests: Recent Labs   Lab 09/26/21 1549 09/27/21 0608  AST 47*  46* 38  ALT '22  23 21  '$ ALKPHOS 93  96 81  BILITOT 1.6*  1.6* 0.7  PROT 7.8  7.7 7.0  ALBUMIN 3.1*  3.2* 2.9*   No results for input(s): "LIPASE", "AMYLASE" in the last 168 hours. No results for input(s): "AMMONIA" in the last 168 hours. Coagulation Profile: Recent Labs  Lab 09/26/21 2150  INR 0.9   Cardiac Enzymes: Recent Labs  Lab 09/26/21 1549  CKTOTAL 157   BNP (last 3 results) No results for input(s): "PROBNP" in the last 8760 hours. HbA1C: No results for input(s): "HGBA1C" in the last 72 hours. CBG: No results for input(s): "GLUCAP" in the last 168 hours. Lipid Profile: No results for input(s): "CHOL", "HDL", "LDLCALC", "TRIG", "CHOLHDL", "LDLDIRECT" in the last 72 hours. Thyroid Function Tests: Recent Labs    09/27/21 0608  TSH 0.225*   Anemia Panel: No results for input(s): "VITAMINB12", "FOLATE", "FERRITIN", "TIBC", "IRON", "RETICCTPCT" in the last 72 hours. Sepsis Labs: Recent Labs  Lab 09/26/21 1549 09/26/21 1814 09/26/21 2150 09/26/21 2359  PROCALCITON 0.32  --   --   --   LATICACIDVEN 2.9* 2.0* 1.7 1.5    Recent Results (from the past 240 hour(s))  Resp Panel by RT-PCR (Flu A&B, Covid) Anterior Nasal Swab     Status: None   Collection Time: 09/26/21  3:50 PM   Specimen: Anterior Nasal Swab  Result Value Ref Range Status   SARS Coronavirus 2 by RT PCR NEGATIVE NEGATIVE Final    Comment: (NOTE) SARS-CoV-2 target nucleic acids are NOT DETECTED.  The SARS-CoV-2 RNA is generally detectable in upper respiratory specimens during the acute phase of infection. The lowest concentration of SARS-CoV-2 viral copies this assay can detect is 138 copies/mL. A negative result does not preclude SARS-Cov-2 infection and should not be used as the sole basis for treatment or other patient management decisions. A negative result may occur with  improper specimen collection/handling, submission of specimen  other than nasopharyngeal swab, presence of viral mutation(s) within the areas targeted by this assay, and inadequate number of viral copies(<138 copies/mL). A negative result must be combined with clinical observations, patient history, and epidemiological information. The expected result is Negative.  Fact Sheet for Patients:  EntrepreneurPulse.com.au  Fact Sheet for Healthcare Providers:  IncredibleEmployment.be  This test is no t yet approved or cleared by the [place] FDA and  has been authorized for detection and/or diagnosis of SARS-CoV-2 by FDA under an Emergency Use Authorization (EUA). This EUA will remain  in effect (meaning this test can be used) for the duration of the COVID-19 declaration under Section 564(b)(1) of the Act, 21 U.S.C.section 360bbb-3(b)(1), unless the authorization is terminated  or revoked sooner.       Influenza A by PCR NEGATIVE NEGATIVE Final   Influenza B by PCR NEGATIVE  NEGATIVE Final    Comment: (NOTE) The Xpert Xpress SARS-CoV-2/FLU/RSV plus assay is intended as an aid in the diagnosis of influenza from Nasopharyngeal swab specimens and should not be used as a sole basis for treatment. Nasal washings and aspirates are unacceptable for Xpert Xpress SARS-CoV-2/FLU/RSV testing.  Fact Sheet for Patients: EntrepreneurPulse.com.au  Fact Sheet for Healthcare Providers: IncredibleEmployment.be  This test is not yet approved or cleared by the [place] FDA and has been authorized for detection and/or diagnosis of SARS-CoV-2 by FDA under an Emergency Use Authorization (EUA). This EUA will remain in effect (meaning this test can be used) for the duration of the COVID-19 declaration under Section 564(b)(1) of the Act, 21 U.S.C. section 360bbb-3(b)(1), unless the authorization is terminated or revoked.  Performed at [facility], [place] [address] [place], [place] [address]   Culture, blood (routine x 2)     Status: None (Preliminary result)   Collection Time: 09/26/21  4:06 PM   Specimen: BLOOD  Result Value Ref Range Status   Specimen Description   Final    BLOOD BLOOD LEFT FOREARM Performed at [facility] [address], [place], [place] [address]    Special Requests   Final    BOTTLES DRAWN AEROBIC AND ANAEROBIC Blood Culture adequate volume Performed at [facility] [address] [place], [place] [address]    Culture   Final    NO GROWTH < 12 HOURS Performed at [facility] [address] [place], [place] [address]    Report Status PENDING  Incomplete  Culture, blood (routine x 2)     Status: None (Preliminary result)   Collection Time: 09/26/21  4:13 PM   Specimen: BLOOD  Result Value Ref Range Status   Specimen Description   Final    BLOOD BLOOD RIGHT FOREARM Performed at [facility] [address] [place], [place] [address]    Special Requests   Final    BOTTLES DRAWN AEROBIC AND ANAEROBIC Blood Culture adequate volume Performed at [facility] [address] [place], [place] [address]    Culture   Final    NO GROWTH < 12 HOURS Performed at [facility] [address] [place], [place] [address]    Report Status PENDING  Incomplete  Respiratory (~20 pathogens) panel by PCR     Status: None   Collection Time: 09/26/21  9:50 PM   Specimen: Nasopharyngeal Swab; Respiratory  Result Value Ref Range Status   Adenovirus NOT DETECTED NOT DETECTED Final   Coronavirus 229E NOT DETECTED NOT DETECTED Final    Comment: (NOTE) The Coronavirus on the Respiratory Panel, DOES NOT test for the novel  Coronavirus (2019 nCoV)    Coronavirus HKU1 NOT DETECTED NOT DETECTED Final   Coronavirus NL63 NOT DETECTED NOT DETECTED Final   Coronavirus OC43 NOT DETECTED NOT DETECTED Final   Metapneumovirus NOT DETECTED NOT DETECTED  Final   Rhinovirus / Enterovirus NOT DETECTED NOT DETECTED Final   Influenza A NOT DETECTED NOT DETECTED Final   Influenza B NOT DETECTED NOT DETECTED Final   Parainfluenza Virus 1 NOT DETECTED NOT DETECTED Final   Parainfluenza Virus 2 NOT DETECTED NOT DETECTED Final   Parainfluenza Virus 3 NOT DETECTED NOT DETECTED Final   Parainfluenza Virus 4 NOT DETECTED NOT DETECTED Final   Respiratory Syncytial Virus NOT DETECTED NOT DETECTED Final   Bordetella pertussis NOT DETECTED NOT DETECTED Final   Bordetella  Parapertussis NOT DETECTED NOT DETECTED Final   Chlamydophila pneumoniae NOT DETECTED NOT DETECTED Final   Mycoplasma pneumoniae NOT DETECTED NOT DETECTED Final    Comment: Performed at [facility], [place] [address], [place], [place] [address]  Respiratory (~20 pathogens) panel by PCR     Status: None   Collection Time: 09/27/21  2:56 AM   Specimen: Nasopharyngeal Swab; Respiratory  Result Value Ref Range Status   Adenovirus NOT DETECTED NOT DETECTED Final   Coronavirus 229E NOT DETECTED NOT DETECTED Final    Comment: (NOTE) The Coronavirus on the Respiratory Panel, DOES NOT test for the novel  Coronavirus (2019 nCoV)    Coronavirus HKU1 NOT DETECTED NOT DETECTED Final   Coronavirus NL63 NOT DETECTED NOT DETECTED Final   Coronavirus OC43 NOT DETECTED NOT DETECTED Final   Metapneumovirus NOT DETECTED NOT DETECTED Final   Rhinovirus / Enterovirus NOT DETECTED NOT DETECTED Final   Influenza A NOT DETECTED NOT DETECTED Final   Influenza B NOT DETECTED NOT DETECTED Final   Parainfluenza Virus 1 NOT DETECTED NOT DETECTED Final   Parainfluenza Virus 2 NOT DETECTED NOT DETECTED Final   Parainfluenza Virus 3 NOT DETECTED NOT DETECTED Final   Parainfluenza Virus 4 NOT DETECTED NOT DETECTED Final   Respiratory Syncytial Virus NOT DETECTED NOT DETECTED Final   Bordetella pertussis NOT DETECTED NOT DETECTED Final   Bordetella Parapertussis NOT DETECTED NOT DETECTED Final    Chlamydophila pneumoniae NOT DETECTED NOT DETECTED Final   Mycoplasma pneumoniae NOT DETECTED NOT DETECTED Final    Comment: Performed at [facility], [place]. [address], [place], [place] [address]  MRSA Next Gen by PCR, Nasal     Status: None   Collection Time: 09/27/21  2:56 AM   Specimen: Nasal Mucosa; Nasal Swab  Result Value Ref Range Status   MRSA by PCR Next Gen NOT DETECTED NOT DETECTED Final    Comment: (NOTE) The GeneXpert MRSA Assay (FDA approved for NASAL specimens only), is one component of a comprehensive MRSA colonization surveillance program. It is not intended to diagnose MRSA infection nor to guide or monitor treatment for MRSA infections. Test performance is not FDA approved in patients less than 58 years old. Performed at [facility], [place] [address] [place], [place] [address]   Expectorated Sputum Assessment w Gram Stain, Rflx to Resp Cult     Status: None   Collection Time: 09/27/21 12:24 PM   Specimen: Expectorated Sputum  Result Value Ref Range Status   Specimen Description EXPECTORATED SPUTUM  Final   Special Requests NONE  Final   Sputum evaluation   Final    THIS SPECIMEN IS ACCEPTABLE FOR SPUTUM CULTURE Performed at [facility], [place] [address], [place], [place] [address]    Report Status 09/27/2021 FINAL  Final  Culture, Respiratory w Gram Stain     Status: None (Preliminary result)   Collection Time: 09/27/21 12:24 PM  Result Value Ref Range Status   Specimen Description   Final    EXPECTORATED SPUTUM Performed at [facility] [address], [place], [place] [address]    Special Requests   Final    NONE Reflexed from [id number] Performed at [facility], [place] [address], [place], [place] [address]    Gram Stain   Final    MODERATE SQUAMOUS EPITHELIAL CELLS PRESENT MODERATE WBC PRESENT,BOTH PMN AND MONONUCLEAR FEW GRAM VARIABLE ROD FEW GRAM NEGATIVE RODS FEW GRAM  POSITIVE COCCI IN  PAIRS AND CHAINS Performed at [facility], [place] [address] [place], [place] [address]    Culture PENDING  Incomplete   Report Status PENDING  Incomplete         Radiology Studies: CT Angio Chest PE W and/or Wo Contrast  Result Date: 09/26/2021 CLINICAL DATA:  Pulmonary embolism (PE) suspected, high prob; Abdominal pain, acute, nonlocalized Abd pain Pt via POV sent from Dr office due to low O2 saturation in office at appointment today, being seen for COPD eval. Pt arrives visibly SOB with O2 sat 70% on room air, increased to 80% on 3L nasal cannula. EXAM: CT ANGIOGRAPHY CHEST CT ABDOMEN AND PELVIS WITH CONTRAST TECHNIQUE: Multidetector CT imaging of the chest was performed using the standard protocol during bolus administration of intravenous contrast. Multiplanar CT image reconstructions and MIPs were obtained to evaluate the vascular anatomy. Multidetector CT imaging of the abdomen and pelvis was performed using the standard protocol during bolus administration of intravenous contrast. RADIATION DOSE REDUCTION: This exam was performed according to the departmental dose-optimization program which includes automated exposure control, adjustment of the mA and/or kV according to patient size and/or use of iterative reconstruction technique. CONTRAST:  158m OMNIPAQUE IOHEXOL 350 MG/ML SOLN COMPARISON:  None Available. FINDINGS: CTA CHEST FINDINGS Cardiovascular: Satisfactory opacification of the pulmonary arteries to the segmental level. No central or segmental evidence of pulmonary embolism. Limited evaluation of the subsegmental level due to timing of contrast. The main pulmonary artery measures at the upper limits of normal. Normal heart size. No significant pericardial effusion. The thoracic aorta is normal in caliber. Mild atherosclerotic plaque of the thoracic aorta. No coronary artery calcifications. Mediastinum/Nodes: Enlarged right hilar lymph node measuring up to 1.7 cm.  Prominent left hilar lymph nodes. Calcifications along the right left hilar lymph nodes may be sequelae of prior granulomatous disease. No enlarged mediastinal or axillary lymph nodes. Thyroid gland, trachea, and esophagus demonstrate no significant findings. Lungs/Pleura: Emphysematous changes. Diffuse bronchial wall thickening. Peribronchovascular tree-in-bud nodularity most prominent in the upper lung zones. Linear atelectasis versus scarring within the right middle lobe. No pulmonary nodule. No pulmonary mass. No pleural effusion. No pneumothorax. Musculoskeletal: No chest wall abnormality. No suspicious lytic or blastic osseous lesions. No acute displaced fracture. Multilevel degenerative changes of the spine. Review of the MIP images confirms the above findings. CT ABDOMEN and PELVIS FINDINGS Hepatobiliary: No focal liver abnormality. The gallbladder is contracted. No gallstones, gallbladder wall thickening, or pericholecystic fluid. No biliary dilatation. Pancreas: No focal lesion. Normal pancreatic contour. No surrounding inflammatory changes. No main pancreatic ductal dilatation. Spleen: Normal in size without focal abnormality. Adrenals/Urinary Tract: No adrenal nodule bilaterally. Bilateral kidneys enhance symmetrically. A left renal 1.4 cm fluid density lesion likely represents a simple renal cyst. Simple renal cysts, in the absence of clinically indicated signs/symptoms, require no independent follow-up. No hydronephrosis. No hydroureter. The urinary bladder is unremarkable. Stomach/Bowel: Stomach is within normal limits. No evidence of bowel wall thickening or dilatation. Scattered colonic diverticulosis appendix appears normal. Vascular/Lymphatic: No abdominal aorta or iliac aneurysm. Severe atherosclerotic plaque of the aorta and its branches. No abdominal, pelvic, or inguinal lymphadenopathy. Reproductive: Prostate is unremarkable. Other: No intraperitoneal free fluid. No intraperitoneal free gas.  No organized fluid collection. Musculoskeletal: No abdominal wall hernia or abnormality. 3.7 cm fat density lesion within the right gluteal musculature likely represents lipomatous lesion. No suspicious lytic or blastic osseous lesions. No acute displaced fracture. Grade 1 anterolisthesis of L4 on L5. Review of the MIP images  confirms the above findings. IMPRESSION: 1. No central or segmental pulmonary embolus. Limited evaluation of the subsegmental level due to timing of contrast. 2. Pulmonary findings suggestive of an atypical infection. No follow-up needed if patient is low-risk (and has no known or suspected primary neoplasm). Non-contrast chest CT can be considered in 12 months if patient is high-risk. This recommendation follows the consensus statement: Guidelines for Management of Incidental Pulmonary Nodules Detected on CT Images: From the Fleischner Society 2017; Radiology 2017; 284:228-243. 3. Right middle lobe linear atelectasis versus scarring. Developing infection not excluded. 4. Right hilar lymphadenopathy may be reactive in etiology. Recommend attention on follow-up. 5. Colonic diverticulosis with no acute diverticulitis. 6.  Aortic Atherosclerosis (ICD10-I70.0). Electronically Signed   By: Iven Finn M.D.   On: 09/26/2021 19:25   CT ABDOMEN PELVIS W CONTRAST  Result Date: 09/26/2021 CLINICAL DATA:  Pulmonary embolism (PE) suspected, high prob; Abdominal pain, acute, nonlocalized Abd pain Pt via POV sent from Dr office due to low O2 saturation in office at appointment today, being seen for COPD eval. Pt arrives visibly SOB with O2 sat 70% on room air, increased to 80% on 3L nasal cannula. EXAM: CT ANGIOGRAPHY CHEST CT ABDOMEN AND PELVIS WITH CONTRAST TECHNIQUE: Multidetector CT imaging of the chest was performed using the standard protocol during bolus administration of intravenous contrast. Multiplanar CT image reconstructions and MIPs were obtained to evaluate the vascular anatomy.  Multidetector CT imaging of the abdomen and pelvis was performed using the standard protocol during bolus administration of intravenous contrast. RADIATION DOSE REDUCTION: This exam was performed according to the departmental dose-optimization program which includes automated exposure control, adjustment of the mA and/or kV according to patient size and/or use of iterative reconstruction technique. CONTRAST:  165m OMNIPAQUE IOHEXOL 350 MG/ML SOLN COMPARISON:  None Available. FINDINGS: CTA CHEST FINDINGS Cardiovascular: Satisfactory opacification of the pulmonary arteries to the segmental level. No central or segmental evidence of pulmonary embolism. Limited evaluation of the subsegmental level due to timing of contrast. The main pulmonary artery measures at the upper limits of normal. Normal heart size. No significant pericardial effusion. The thoracic aorta is normal in caliber. Mild atherosclerotic plaque of the thoracic aorta. No coronary artery calcifications. Mediastinum/Nodes: Enlarged right hilar lymph node measuring up to 1.7 cm. Prominent left hilar lymph nodes. Calcifications along the right left hilar lymph nodes may be sequelae of prior granulomatous disease. No enlarged mediastinal or axillary lymph nodes. Thyroid gland, trachea, and esophagus demonstrate no significant findings. Lungs/Pleura: Emphysematous changes. Diffuse bronchial wall thickening. Peribronchovascular tree-in-bud nodularity most prominent in the upper lung zones. Linear atelectasis versus scarring within the right middle lobe. No pulmonary nodule. No pulmonary mass. No pleural effusion. No pneumothorax. Musculoskeletal: No chest wall abnormality. No suspicious lytic or blastic osseous lesions. No acute displaced fracture. Multilevel degenerative changes of the spine. Review of the MIP images confirms the above findings. CT ABDOMEN and PELVIS FINDINGS Hepatobiliary: No focal liver abnormality. The gallbladder is contracted. No  gallstones, gallbladder wall thickening, or pericholecystic fluid. No biliary dilatation. Pancreas: No focal lesion. Normal pancreatic contour. No surrounding inflammatory changes. No main pancreatic ductal dilatation. Spleen: Normal in size without focal abnormality. Adrenals/Urinary Tract: No adrenal nodule bilaterally. Bilateral kidneys enhance symmetrically. A left renal 1.4 cm fluid density lesion likely represents a simple renal cyst. Simple renal cysts, in the absence of clinically indicated signs/symptoms, require no independent follow-up. No hydronephrosis. No hydroureter. The urinary bladder is unremarkable. Stomach/Bowel: Stomach is within normal limits. No evidence of  bowel wall thickening or dilatation. Scattered colonic diverticulosis appendix appears normal. Vascular/Lymphatic: No abdominal aorta or iliac aneurysm. Severe atherosclerotic plaque of the aorta and its branches. No abdominal, pelvic, or inguinal lymphadenopathy. Reproductive: Prostate is unremarkable. Other: No intraperitoneal free fluid. No intraperitoneal free gas. No organized fluid collection. Musculoskeletal: No abdominal wall hernia or abnormality. 3.7 cm fat density lesion within the right gluteal musculature likely represents lipomatous lesion. No suspicious lytic or blastic osseous lesions. No acute displaced fracture. Grade 1 anterolisthesis of L4 on L5. Review of the MIP images confirms the above findings. IMPRESSION: 1. No central or segmental pulmonary embolus. Limited evaluation of the subsegmental level due to timing of contrast. 2. Pulmonary findings suggestive of an atypical infection. No follow-up needed if patient is low-risk (and has no known or suspected primary neoplasm). Non-contrast chest CT can be considered in 12 months if patient is high-risk. This recommendation follows the consensus statement: Guidelines for Management of Incidental Pulmonary Nodules Detected on CT Images: From the Fleischner Society 2017;  Radiology 2017; 284:228-243. 3. Right middle lobe linear atelectasis versus scarring. Developing infection not excluded. 4. Right hilar lymphadenopathy may be reactive in etiology. Recommend attention on follow-up. 5. Colonic diverticulosis with no acute diverticulitis. 6.  Aortic Atherosclerosis (ICD10-I70.0). Electronically Signed   By: Iven Finn M.D.   On: 09/26/2021 19:25   DG Chest Portable 1 View  Result Date: 09/26/2021 CLINICAL DATA:  Shortness of breath. EXAM: PORTABLE CHEST 1 VIEW COMPARISON:  Chest x-ray dated January 28, 2021. FINDINGS: The heart size and mediastinal contours are within normal limits. Normal pulmonary vascularity. Hazy airspace density in the right upper lobe. No pleural effusion or pneumothorax. No acute osseous abnormality. IMPRESSION: 1. Right upper lobe pneumonia. Electronically Signed   By: Titus Dubin M.D.   On: 09/26/2021 16:04        Scheduled Meds:  arformoterol  15 mcg Nebulization BID   budesonide (PULMICORT) nebulizer solution  0.25 mg Nebulization BID   Chlorhexidine Gluconate Cloth  6 each Topical Daily   ciprofloxacin  2 drop Left Eye Q4H while awake   donepezil  5 mg Oral QHS   enoxaparin (LOVENOX) injection  40 mg Subcutaneous Q24H   feeding supplement  237 mL Oral BID BM   guaiFENesin  600 mg Oral BID   latanoprost  1 drop Both Eyes QHS   nicotine  14 mg Transdermal Daily   mouth rinse  15 mL Mouth Rinse 4 times per day   predniSONE  40 mg Oral Q breakfast   revefenacin  175 mcg Nebulization Daily   Continuous Infusions:  azithromycin Stopped (09/27/21 1810)   cefTRIAXone (ROCEPHIN)  IV Stopped (09/27/21 1703)     LOS: 2 days    Time spent: 35 minutes.     Elmarie Shiley, MD [facility]   If 7PM-7AM, please contact night-coverage [email or web address]  09/28/2021, 7:20 AM

## 2021-09-28 NOTE — Evaluation (Signed)
Clinical/Bedside Swallow Evaluation Patient Details  Name: Travis Hicks MRN: [id number] Date of Birth: 08/26/1950  Today's Date: 09/28/2021 Time: SLP Start Time (ACUTE ONLY): 1003 SLP Stop Time (ACUTE ONLY): 1031 SLP Time Calculation (min) (ACUTE ONLY): 28 min  Past Medical History:  Past Medical History:  Diagnosis Date   Cataract    veru small per pt .   Emphysema of lung ([place])    Glaucoma 2018   Overactive bladder    Past Surgical History:  Past Surgical History:  Procedure Laterality Date   COLONOSCOPY     10-10-02,11-21-2005   DENTAL SURGERY     had 14 teeth pulled    HAND SURGERY Right    POLYPECTOMY     10-10-02,11-21-05   HPI:  71 yo male with h/o COPD, ETOH use, memory loss, glaucoma, tobacco use - admitted with respiratory deficits with hypoxia, COPD exacerbation, PCCM.  Imaging of chest "Pulmonary findings suggestive of an atypical infection. No  follow-up needed if patient is low-risk (and has no known or  suspected primary neoplasm). Non-contrast chest CT can be considered  in 12 months if patient is high-risk. This recommendation follows  the consensus statement: Guidelines for Management of Incidental  Pulmonary Nodules Detected on CT Images: From the Fleischner Society  2017; Radiology 2017; 284:228-243.  3. Right middle lobe linear atelectasis versus scarring. Developing  infection not excluded."  Pt has h/o excessive gagging with intake when wearing new dentures - starting in 2019. He has seen a dentist with modification of upper denture with resolution of gagging, difficulties.    Assessment / Plan / Recommendation  Clinical Impression  Patient presents with functional oropharyngeal swallow ability based on clinical swallow evaluation. No indication of aspiration or dysphagia with po including 3 ounces thin water, 2 ounces applesauce and solids.  He easily passed 3 ounce [place].  Pt does admit to occasional issues with ticking - pointing to proximal esophagus - but denies  refluxing.  Recommend regular/thin diet as tolerated.  Advised pt to importance to assure he is not dyspneic with po intake.  SLP to sign off. SLP Visit Diagnosis: Dysphagia, unspecified (R13.10)    Aspiration Risk  No limitations    Diet Recommendation Thin liquid;Regular   Liquid Administration via: Straw;Cup Medication Administration: Whole meds with liquid Supervision: Patient able to self feed Compensations: Slow rate;Small sips/bites Postural Changes: Seated upright at 90 degrees;Remain upright for at least 30 minutes after po intake    Other  Recommendations Oral Care Recommendations: Oral care BID    Recommendations for follow up therapy are one component of a multi-disciplinary discharge planning process, led by the attending physician.  Recommendations may be updated based on patient status, additional functional criteria and insurance authorization.  Follow up Recommendations No SLP follow up      Assistance Recommended at Discharge None  Functional Status Assessment Patient has not had a recent decline in their functional status  Frequency and Duration     N/a       Prognosis   N/a     Swallow Study   General Date of Onset: 09/28/21 HPI: 71 yo male with h/o COPD, ETOH use, memory loss, glaucoma, tobacco use - admitted with respiratory deficits with hypoxia, COPD exacerbation, PCCM.  Imaging of chest "Pulmonary findings suggestive of an atypical infection. No  follow-up needed if patient is low-risk (and has no known or  suspected primary neoplasm). Non-contrast chest CT can be considered  in 12 months if patient is high-risk.  This recommendation follows  the consensus statement: Guidelines for Management of Incidental  Pulmonary Nodules Detected on CT Images: From the Fleischner Society  2017; Radiology 2017; 284:228-243.  3. Right middle lobe linear atelectasis versus scarring. Developing  infection not excluded."  Pt has h/o excessive gagging with intake when wearing  new dentures - starting in 2019. He has seen a dentist with modification of upper denture with resolution of gagging, difficulties. Type of Study: Bedside Swallow Evaluation Previous Swallow Assessment: MBS 08/27/2019 gagging ? due to upper denture -  rec reg/thin - try po without dentures; DG esophagus 08/27/2019 negative Diet Prior to this Study: Regular;Thin liquids Temperature Spikes Noted: No Respiratory Status: Nasal cannula History of Recent Intubation: No Behavior/Cognition: Cooperative;Alert;Pleasant mood Oral Cavity Assessment: Within Functional Limits Oral Care Completed by SLP: Other (Comment) (set up pt and he brushed his teeth prior to po administration) Oral Cavity - Dentition: Dentures, top Vision: Functional for self-feeding Self-Feeding Abilities: Able to feed self Patient Positioning: Upright in bed Baseline Vocal Quality: Normal Volitional Cough: Strong Volitional Swallow: Able to elicit    Oral/Motor/Sensory Function Overall Oral Motor/Sensory Function: Within functional limits   Ice Chips Ice chips: Not tested   Thin Liquid Thin Liquid: Within functional limits Presentation: Cup;Self Fed    Nectar Thick Nectar Thick Liquid: Not tested   Honey Thick Honey Thick Liquid: Not tested   Puree Puree: Within functional limits Presentation: Self Fed;Spoon   Solid     Solid: Within functional limits Presentation: [name], [name] 09/28/2021,10:52 AM  Kathleen Lime, MS [place] Office [phone] Pager [phone]

## 2021-09-28 NOTE — Progress Notes (Signed)
Occupational Therapy Treatment Patient Details Name: Travis Hicks MRN: [id number] DOB: June 02, 1950 Today's Date: 09/28/2021   History of present illness Travis Hicks is a 71 y.o. male with a PMH significant for COPD, tobacco use  overactive bladder, and ETOH use who presented to the ED 8/7 for complaints of shortness of breath and hypoxia from PCP office.Chest x-ray on admission positive for right upper lobe pneumonia.  CTA chest negative for acute PE   OT comments  Treatment focused on out of bed activity- standing and ambulating as well as education. Patient reports he had only been out of  bed to bathroom x 1. Therapist educated patient that he needed be out of bed to and moving more to maintain strength. Patient found on 5 L [place] and ambulated on 4 L . O2 sats maintained above 92% but patient having some dyspnea. Continue POC.    Recommendations for follow up therapy are one component of a multi-disciplinary discharge planning process, led by the attending physician.  Recommendations may be updated based on patient status, additional functional criteria and insurance authorization.    Follow Up Recommendations  No OT follow up    Assistance Recommended at Discharge Frequent or constant Supervision/Assistance  Patient can return home with the following  A little help with bathing/dressing/bathroom;Assistance with cooking/housework;Direct supervision/assist for financial management;Assist for transportation;Help with stairs or ramp for entrance;Direct supervision/assist for medications management   Equipment Recommendations  None recommended by OT    Recommendations for Other Services      Precautions / Restrictions Precautions Precautions: None Precaution Comments: monitor sats Restrictions Weight Bearing Restrictions: No       Mobility Bed Mobility Overal bed mobility: Needs Assistance Bed Mobility: Supine to Sit     Supine to sit: Supervision     General bed mobility  comments: increased time. Reports dyspnea (breathing out) with movement    Transfers Overall transfer level: Needs assistance Equipment used: None Transfers: Sit to/from Stand Sit to Stand: Supervision           General transfer comment: Supervision to stand. Min guard to stand and ambulate 30 feet on 4 L [place]. with patient holding and manuevering IV pole.     Balance Overall balance assessment: No apparent balance deficits (not formally assessed)                                         ADL either performed or assessed with clinical judgement   ADL                                              Extremity/Trunk Assessment Upper Extremity Assessment Upper Extremity Assessment: Overall WFL for tasks assessed   Lower Extremity Assessment Lower Extremity Assessment: Defer to PT evaluation   Cervical / Trunk Assessment Cervical / Trunk Assessment: Normal    Vision Patient Visual Report: No change from baseline     Perception     Praxis      Cognition   Behavior During Therapy: WFL for tasks assessed/performed Overall Cognitive Status: Within Functional Limits for tasks assessed  Exercises      Shoulder Instructions       General Comments      Pertinent Vitals/ Pain       Pain Assessment Pain Assessment: No/denies pain  Home Living                                          Prior Functioning/Environment              Frequency  Min 2X/week        Progress Toward Goals  OT Goals(current goals can now be found in the care plan section)  Progress towards OT goals: Progressing toward goals  Acute Rehab OT Goals OT Goal Formulation: With patient Time For Goal Achievement: 10/11/21 Potential to Achieve Goals: Good  Plan Discharge plan remains appropriate    Co-evaluation          OT goals addressed during session: Other (comment)  (activity tolerance)      AM-PAC OT "6 Clicks" Daily Activity     Outcome Measure   Help from another person eating meals?: None Help from another person taking care of personal grooming?: A Little Help from another person toileting, which includes using toliet, bedpan, or urinal?: A Little Help from another person bathing (including washing, rinsing, drying)?: A Little Help from another person to put on and taking off regular upper body clothing?: A Little Help from another person to put on and taking off regular lower body clothing?: A Little 6 Click Score: 19    End of Session Equipment Utilized During Treatment: Oxygen  OT Visit Diagnosis: Unsteadiness on feet (R26.81)   Activity Tolerance Patient tolerated treatment well   Patient Left in chair;with call bell/phone within reach;with chair alarm set   Nurse Communication Mobility status        Time: 0349-1791 OT Time Calculation (min): 15 min  Charges: OT General Charges $OT Visit: 1 Visit OT Treatments $Therapeutic Activity: 8-22 mins  Travis Hicks, OTR/L [place]  Office [phone] Pager: [place] 09/28/2021, 4:41 PM

## 2021-09-29 DIAGNOSIS — J189 Pneumonia, unspecified organism: Secondary | ICD-10-CM | POA: Diagnosis not present

## 2021-09-29 LAB — CBC
HCT: 38.8 % — ABNORMAL LOW (ref 39.0–52.0)
Hemoglobin: 12.7 g/dL — ABNORMAL LOW (ref 13.0–17.0)
MCH: 32.9 pg (ref 26.0–34.0)
MCHC: 32.7 g/dL (ref 30.0–36.0)
MCV: 100.5 fL — ABNORMAL HIGH (ref 80.0–100.0)
Platelets: 324 10*3/uL (ref 150–400)
RBC: 3.86 MIL/uL — ABNORMAL LOW (ref 4.22–5.81)
RDW: 13.5 % (ref 11.5–15.5)
WBC: 12.2 10*3/uL — ABNORMAL HIGH (ref 4.0–10.5)
nRBC: 0 % (ref 0.0–0.2)

## 2021-09-29 LAB — BASIC METABOLIC PANEL
Anion gap: 10 (ref 5–15)
BUN: 14 mg/dL (ref 8–23)
CO2: 37 mmol/L — ABNORMAL HIGH (ref 22–32)
Calcium: 8.8 mg/dL — ABNORMAL LOW (ref 8.9–10.3)
Chloride: 95 mmol/L — ABNORMAL LOW (ref 98–111)
Creatinine, Ser: 0.81 mg/dL (ref 0.61–1.24)
GFR, Estimated: >60 mL/min (ref >60)
Glucose, Bld: 152 mg/dL — ABNORMAL HIGH (ref 70–99)
Potassium: 2.7 mmol/L — CL (ref 3.5–5.1)
Sodium: 142 mmol/L (ref 135–145)

## 2021-09-29 LAB — T3, FREE: T3, Free: 1.5 pg/mL — ABNORMAL LOW (ref 2.0–4.4)

## 2021-09-29 LAB — CULTURE, RESPIRATORY W GRAM STAIN: Culture: NORMAL

## 2021-09-29 MED ORDER — AZITHROMYCIN 250 MG PO TABS
500.0000 mg | ORAL_TABLET | Freq: Every day | ORAL | Status: DC
Start: 2021-09-29 — End: 2021-10-01
  Administered 2021-09-29 – 2021-09-30 (×2): 500 mg via ORAL
  Filled 2021-09-29 (×2): qty 2

## 2021-09-29 MED ORDER — POTASSIUM CHLORIDE 10 MEQ/100ML IV SOLN
10.0000 meq | INTRAVENOUS | Status: AC
  Administered 2021-09-29 (×2): 10 meq via INTRAVENOUS
  Filled 2021-09-29: qty 100

## 2021-09-29 MED ORDER — POTASSIUM CHLORIDE CRYS ER 20 MEQ PO TBCR
40.0000 meq | EXTENDED_RELEASE_TABLET | ORAL | Status: AC
  Administered 2021-09-29 (×2): 40 meq via ORAL
  Filled 2021-09-29 (×2): qty 2

## 2021-09-29 NOTE — Progress Notes (Signed)
PROGRESS NOTE    Travis Hicks  ZPH:[id number] DOB: 13-Jun-1950 DOA: 09/26/2021 PCP: Cassandria Anger, MD   Brief Narrative: 71 year old with past medical history significant for COPD, tobacco abuse, alcohol abuse, glaucoma presents with shortness of breath and hypoxia from pulmonology office.  He was found to be hypoxic with oxygen saturation 70% on room air.  He was placed on 3 L of oxygen and transferred to the ED.  Chest x-ray positive for right upper lobe pneumonia.  CTA chest negative for PE.  Findings suggestive of atypical infection, mediastinal adenopathy.  [name]  He has been receiving IV antibiotics, IVF steroids subsequently transition to oral prednisone.    Assessment & Plan:   Principal Problem:   CAP (community acquired pneumonia) Active Problems:   Tobacco abuse   Hematuria   Glaucoma   Malnutrition ([place])   COPD exacerbation (HCC)   Hypokalemia   Dementia without behavioral disturbance (HCC)   Acute respiratory failure with hypoxia (HCC)   Sepsis (HCC)   History of ETOH abuse   AKI (acute kidney injury) ([place])   Protein-calorie malnutrition, severe   1-Acute Hypoxic respiratory failure: COPD exacerbation and  community-acquired pneumonia: Respiratory panel negative.  Strept Pneumonia negative. Legionella antigen negative.  Follow sputum culture.  Few normal respiratory flora, no a staph or Pseudomonas. On IV ceftriaxone and azithromycin. Change ceftriaxone to Unasyn 8/09.Marland Kitchen  Speech swallow for aspiration. ;  Recommended thin liquid regular diet. Continue with LABA LAMA ICS.  Patient can be discharged on Trelegy at discharge. On prednisone  Hypokalemia: Replete IV and oral potassium  AKI: Received IV fluids.  Resolved  Dementia: Continue with Aricept  Mediastinal lymphadenopathy: CT follow-up  Constipation;  Report feeling abdominal  fullness. No passing gas.  KUB: Nondescript loop of air distended bowel within the right CT abdomen pelvis: Negative He had a bowel movement.  Severe malnutrition in context of chronic illness.  Started on supplement.     Estimated body mass index is 19.98 kg/m as calculated from the following:   Height as of this encounter: '6\' 1"'$  (1.854 m).   Weight as of this encounter: 68.7 kg.   DVT prophylaxis: Lovenox Code Status: Full code Family Communication: care discussed with patient.  Disposition Plan:  Status is: Inpatient Remains inpatient appropriate because: resp failure and PNA    Consultants:  Pulmonologist   Procedures:    Antimicrobials:    Subjective: He continues to feel fair.  He report cough and shortness of breath.  He had a bowel movement yesterday.  Objective: Vitals:   09/29/21 0908 09/29/21 0909 09/29/21 0957 09/29/21 1128  BP:   113/74   Pulse:   87   Resp:   19   Temp:   97.6 F (36.4 C)   TempSrc:   Oral   SpO2: 92% 92% 92% 92%  Weight:      Height:        Intake/Output Summary (Last 24 hours) at 09/29/2021 1254 Last data filed at 09/29/2021 1000 Gross per 24 hour  Intake 920 ml  Output 1175 ml  Net -255 ml    Filed Weights   09/26/21 1543 09/27/21 0300  Weight: 65.8 kg 68.7 kg    Examination:  General exam: NAD Respiratory system: BL ronchus Cardiovascular system: S 1, S 2 RRR Gastrointestinal system: BS present, soft, nt Central nervous system: alert, follows command Extremities: No edema.    Data Reviewed: I  have personally reviewed following labs and imaging studies  CBC: Recent Labs  Lab 09/26/21 1549 09/27/21 0608 09/28/21 0249 09/29/21 1013  WBC 10.9* 9.9 12.5* 12.2*  NEUTROABS 9.2* 9.0*  --   --   HGB 14.2 13.4 11.6* 12.7*  HCT 41.9 41.1 36.2* 38.8*  MCV 100.5* 103.0* 103.4* 100.5*  PLT 264 219 251 270    Basic Metabolic Panel: Recent Labs  Lab 09/26/21 1549 09/27/21 0608 09/28/21 0249 09/29/21 1013   NA 140 140 140 142  K 2.9* 3.7 3.5 2.7*  CL 96* 100 99 95*  CO2 '29 31 31 '$ 37*  GLUCOSE 199* 189* 158* 152*  BUN '16 12 18 14  '$ CREATININE 1.38* 0.97 0.93 0.81  CALCIUM 8.9 8.3* 8.2* 8.8*  MG 1.9  --   --   --   PHOS 4.7*  --   --   --     GFR: Estimated Creatinine Clearance: 81.3 mL/min (by C-G formula based on SCr of 0.81 mg/dL). Liver Function Tests: Recent Labs  Lab 09/26/21 1549 09/27/21 0608  AST 47*  46* 38  ALT '22  23 21  '$ ALKPHOS 93  96 81  BILITOT 1.6*  1.6* 0.7  PROT 7.8  7.7 7.0  ALBUMIN 3.1*  3.2* 2.9*    No results for input(s): "LIPASE", "AMYLASE" in the last 168 hours. No results for input(s): "AMMONIA" in the last 168 hours. Coagulation Profile: Recent Labs  Lab 09/26/21 2150  INR 0.9    Cardiac Enzymes: Recent Labs  Lab 09/26/21 1549  CKTOTAL 157    BNP (last 3 results) No results for input(s): "PROBNP" in the last 8760 hours. HbA1C: No results for input(s): "HGBA1C" in the last 72 hours. CBG: No results for input(s): "GLUCAP" in the last 168 hours. Lipid Profile: No results for input(s): "CHOL", "HDL", "LDLCALC", "TRIG", "CHOLHDL", "LDLDIRECT" in the last 72 hours. Thyroid Function Tests: Recent Labs    09/27/21 0608 09/28/21 0759  TSH 0.225*  --   FREET4  --  0.88    Anemia Panel: No results for input(s): "VITAMINB12", "FOLATE", "FERRITIN", "TIBC", "IRON", "RETICCTPCT" in the last 72 hours. Sepsis Labs: Recent Labs  Lab 09/26/21 1549 09/26/21 1814 09/26/21 2150 09/26/21 2359  PROCALCITON 0.32  --   --   --   LATICACIDVEN 2.9* 2.0* 1.7 1.5     Recent Results (from the past 240 hour(s))  Resp Panel by RT-PCR (Flu A&B, Covid) Anterior Nasal Swab     Status: None   Collection Time: 09/26/21  3:50 PM   Specimen: Anterior Nasal Swab  Result Value Ref Range Status   SARS Coronavirus 2 by RT PCR NEGATIVE NEGATIVE Final    Comment: (NOTE) SARS-CoV-2 target nucleic acids are NOT DETECTED.  The SARS-CoV-2 RNA is generally  detectable in upper respiratory specimens during the acute phase of infection. The lowest concentration of SARS-CoV-2 viral copies this assay can detect is 138 copies/mL. A negative result does not preclude SARS-Cov-2 infection and should not be used as the sole basis for treatment or other patient management decisions. A negative result may occur with  improper specimen collection/handling, submission of specimen other than nasopharyngeal swab, presence of viral mutation(s) within the areas targeted by this assay, and inadequate number of viral copies(<138 copies/mL). A negative result must be combined with clinical observations, patient history, and epidemiological information. The expected result is Negative.  Fact Sheet for Patients:  EntrepreneurPulse.com.au  Fact Sheet for Healthcare Providers:  IncredibleEmployment.be  This test is no  t yet approved or cleared by the [place] and  has been authorized for detection and/or diagnosis of SARS-CoV-2 by FDA under an Emergency Use Authorization (EUA). This EUA will remain  in effect (meaning this test can be used) for the duration of the COVID-19 declaration under Section 564(b)(1) of the Act, 21 U.S.C.section 360bbb-3(b)(1), unless the authorization is terminated  or revoked sooner.       Influenza A by PCR NEGATIVE NEGATIVE Final   Influenza B by PCR NEGATIVE NEGATIVE Final    Comment: (NOTE) The Xpert Xpress SARS-CoV-2/FLU/RSV plus assay is intended as an aid in the diagnosis of influenza from Nasopharyngeal swab specimens and should not be used as a sole basis for treatment. Nasal washings and aspirates are unacceptable for Xpert Xpress SARS-CoV-2/FLU/RSV testing.  Fact Sheet for Patients: EntrepreneurPulse.com.au  Fact Sheet for Healthcare Providers: IncredibleEmployment.be  This test is not yet approved or cleared by the [place] FDA  and has been authorized for detection and/or diagnosis of SARS-CoV-2 by FDA under an Emergency Use Authorization (EUA). This EUA will remain in effect (meaning this test can be used) for the duration of the COVID-19 declaration under Section 564(b)(1) of the Act, 21 U.S.C. section 360bbb-3(b)(1), unless the authorization is terminated or revoked.  Performed at [facility], [place] [address], [place], [place] [address]   Culture, blood (routine x 2)     Status: None (Preliminary result)   Collection Time: 09/26/21  4:06 PM   Specimen: BLOOD  Result Value Ref Range Status   Specimen Description   Final    BLOOD BLOOD LEFT FOREARM Performed at [facility] [address] [place], [place] [address]    Special Requests   Final    BOTTLES DRAWN AEROBIC AND ANAEROBIC Blood Culture adequate volume Performed at [facility] [address] [place], [place] [address]    Culture   Final    NO GROWTH 3 DAYS Performed at [facility], [place] [address] [place], [place] [address]    Report Status PENDING  Incomplete  Culture, blood (routine x 2)     Status: None (Preliminary result)   Collection Time: 09/26/21  4:13 PM   Specimen: BLOOD  Result Value Ref Range Status   Specimen Description   Final    BLOOD BLOOD RIGHT FOREARM Performed at [place] [address] [place], [place] [address]    Special Requests   Final    BOTTLES DRAWN AEROBIC AND ANAEROBIC Blood Culture adequate volume Performed at [place] [address] [place], [place] [address]    Culture   Final    NO GROWTH 3 DAYS Performed at [facility], [place] [address], [place], [place] [address]    Report Status PENDING  Incomplete  Urine Culture     Status: None   Collection Time: 09/26/21  8:08 PM   Specimen: Urine, Clean Catch  Result Value Ref Range Status   Specimen Description   Final    URINE, CLEAN  CATCH Performed at [facility], [place] [address] [place], [place] [address]    Special Requests   Final    NONE Performed at [place], [place] [address] [place], [place] [address]    Culture   Final    NO GROWTH Performed at [facility], [place] [address] [place],  [address]    Report Status 09/28/2021 FINAL  Final  Respiratory (~20 pathogens) panel by PCR     Status: None   Collection Time: 09/26/21  9:50 PM   Specimen: Nasopharyngeal Swab; Respiratory  Result Value Ref Range Status   Adenovirus NOT DETECTED NOT DETECTED Final   Coronavirus 229E NOT DETECTED NOT DETECTED Final    Comment: (NOTE) The Coronavirus on the Respiratory Panel, DOES NOT test for the novel  Coronavirus (2019 nCoV)    Coronavirus HKU1 NOT DETECTED NOT DETECTED Final   Coronavirus NL63 NOT DETECTED NOT DETECTED Final   Coronavirus OC43 NOT DETECTED NOT DETECTED Final   Metapneumovirus NOT DETECTED NOT DETECTED Final   Rhinovirus / Enterovirus NOT DETECTED NOT DETECTED Final   Influenza A NOT DETECTED NOT DETECTED Final   Influenza B NOT DETECTED NOT DETECTED Final   Parainfluenza Virus 1 NOT DETECTED NOT DETECTED Final   Parainfluenza Virus 2 NOT DETECTED NOT DETECTED Final   Parainfluenza Virus 3 NOT DETECTED NOT DETECTED Final   Parainfluenza Virus 4 NOT DETECTED NOT DETECTED Final   Respiratory Syncytial Virus NOT DETECTED NOT DETECTED Final   Bordetella pertussis NOT DETECTED NOT DETECTED Final   Bordetella Parapertussis NOT DETECTED NOT DETECTED Final   Chlamydophila pneumoniae NOT DETECTED NOT DETECTED Final   Mycoplasma pneumoniae NOT DETECTED NOT DETECTED Final    Comment: Performed at [facility], [place]. [address], [place], [place] [address]  Respiratory (~20 pathogens) panel by PCR     Status: None   Collection Time: 09/27/21  2:56 AM   Specimen: Nasopharyngeal Swab; Respiratory  Result Value Ref Range Status   Adenovirus NOT DETECTED  NOT DETECTED Final   Coronavirus 229E NOT DETECTED NOT DETECTED Final    Comment: (NOTE) The Coronavirus on the Respiratory Panel, DOES NOT test for the novel  Coronavirus (2019 nCoV)    Coronavirus HKU1 NOT DETECTED NOT DETECTED Final   Coronavirus NL63 NOT DETECTED NOT DETECTED Final   Coronavirus OC43 NOT DETECTED NOT DETECTED Final   Metapneumovirus NOT DETECTED NOT DETECTED Final   Rhinovirus / Enterovirus NOT DETECTED NOT DETECTED Final   Influenza A NOT DETECTED NOT DETECTED Final   Influenza B NOT DETECTED NOT DETECTED Final   Parainfluenza Virus 1 NOT DETECTED NOT DETECTED Final   Parainfluenza Virus 2 NOT DETECTED NOT DETECTED Final   Parainfluenza Virus 3 NOT DETECTED NOT DETECTED Final   Parainfluenza Virus 4 NOT DETECTED NOT DETECTED Final   Respiratory Syncytial Virus NOT DETECTED NOT DETECTED Final   Bordetella pertussis NOT DETECTED NOT DETECTED Final   Bordetella Parapertussis NOT DETECTED NOT DETECTED Final   Chlamydophila pneumoniae NOT DETECTED NOT DETECTED Final   Mycoplasma pneumoniae NOT DETECTED NOT DETECTED Final    Comment: Performed at [facility], [place]. [address], [place], [place] [address]  MRSA Next Gen by PCR, Nasal     Status: None   Collection Time: 09/27/21  2:56 AM   Specimen: Nasal Mucosa; Nasal Swab  Result Value Ref Range Status   MRSA by PCR Next Gen NOT DETECTED NOT DETECTED Final    Comment: (NOTE) The GeneXpert MRSA Assay (FDA approved for NASAL specimens only), is one component of a comprehensive MRSA colonization surveillance program. It is not intended to diagnose MRSA infection nor to guide or monitor treatment for MRSA infections. Test performance is not FDA approved in patients less than 44 years old. Performed at [facility], [place] [address] [place], [place] [address]   Culture, blood (routine x 2) Call  MD if unable to obtain prior to antibiotics being given     Status: None (Preliminary result)    Collection Time: 09/27/21  6:08 AM   Specimen: BLOOD LEFT ARM  Result Value Ref Range Status   Specimen Description   Final    BLOOD LEFT ARM Performed at [place] [address] [place], [place] [address]    Special Requests   Final    BOTTLES DRAWN AEROBIC AND ANAEROBIC Blood Culture adequate volume Performed at [facility] [address] [place], [place] [address]    Culture   Final    NO GROWTH 2 DAYS Performed at [facility] [address] [place], [place] [address]    Report Status PENDING  Incomplete  Culture, blood (routine x 2) Call MD if unable to obtain prior to antibiotics being given     Status: None (Preliminary result)   Collection Time: 09/27/21  6:17 AM   Specimen: BLOOD LEFT WRIST  Result Value Ref Range Status   Specimen Description   Final    BLOOD LEFT WRIST Performed at [facility] [address] [place], [place] [address]    Special Requests   Final    BOTTLES DRAWN AEROBIC AND ANAEROBIC Blood Culture adequate volume Performed at [place] [address] [place], [place] [address]    Culture   Final    NO GROWTH 2 DAYS Performed at [facility] [address], [place], [place] [address]    Report Status PENDING  Incomplete  Expectorated Sputum Assessment w Gram Stain, Rflx to Resp Cult     Status: None   Collection Time: 09/27/21 12:24 PM   Specimen: Expectorated Sputum  Result Value Ref Range Status   Specimen Description EXPECTORATED SPUTUM  Final   Special Requests NONE  Final   Sputum evaluation   Final    THIS SPECIMEN IS ACCEPTABLE FOR SPUTUM CULTURE Performed at [facility], [place] [address], [place], [place] [address]    Report Status 09/27/2021 FINAL  Final  Culture, Respiratory w Gram Stain     Status: None (Preliminary result)   Collection Time: 09/27/21 12:24 PM  Result Value Ref Range Status   Specimen  Description   Final    EXPECTORATED SPUTUM Performed at [place] [facility] [address], [place], [place] [address]    Special Requests   Final    NONE Reflexed from [id number] Performed at [facility], [place] [address] [place], [place] [address]    Gram Stain   Final    MODERATE SQUAMOUS EPITHELIAL CELLS PRESENT MODERATE WBC PRESENT,BOTH PMN AND MONONUCLEAR FEW GRAM VARIABLE ROD FEW GRAM NEGATIVE RODS FEW GRAM POSITIVE COCCI IN PAIRS AND CHAINS    Culture   Final    FEW Normal respiratory flora-no Staph aureus or Pseudomonas seen Performed at [facility], [place] [address], [place], [place] [address]    Report Status PENDING  Incomplete         Radiology Studies: CT ABDOMEN PELVIS WO CONTRAST  Result Date: 09/28/2021 CLINICAL DATA:  Bowel obstruction suspected EXAM: CT ABDOMEN AND PELVIS WITHOUT CONTRAST TECHNIQUE: Multidetector CT imaging of the abdomen and pelvis was performed following the standard protocol without IV contrast. RADIATION DOSE REDUCTION: This exam was performed according to the departmental dose-optimization program which includes automated exposure control, adjustment of the mA and/or kV according to patient size and/or use  of iterative reconstruction technique. COMPARISON:  09/26/2021 FINDINGS: Lower chest: Dependent bibasilar opacities, likely atelectasis. No effusions. Hepatobiliary: No focal hepatic abnormality. Gallbladder unremarkable. Pancreas: No focal abnormality or ductal dilatation. Spleen: No focal abnormality.  Normal size. Adrenals/Urinary Tract: 14 mm low-density lesion in the midpole of the left kidney compatible with cyst, stable. No follow-up imaging recommended. No stones or hydronephrosis. Adrenal glands and urinary bladder unremarkable. Stomach/Bowel: Normal appendix. Stomach, large and small bowel grossly unremarkable. Vascular/Lymphatic: Aortic atherosclerosis. No evidence of aneurysm or adenopathy.  Reproductive: No visible focal abnormality. Other: No free fluid or free air. Musculoskeletal: No acute bony abnormality. IMPRESSION: Dependent bibasilar opacities, likely atelectasis. Aortic atherosclerosis. No acute findings in the abdomen or pelvis. Electronically Signed   By: Rolm Baptise M.D.   On: 09/28/2021 22:30   DG Abd 1 View  Result Date: 09/28/2021 CLINICAL DATA:  Provided history: Abdominal distension. EXAM: ABDOMEN - 1 VIEW COMPARISON:  CT abdomen/pelvis 09/26/2021. FINDINGS: Nondescript loop of air distended bowel within the right hemiabdomen, measuring 4.9 cm in diameter. Elsewhere, no dilated loops of bowel are appreciated. No acute bony abnormality is identified. Lower lumbar spondylosis. IMPRESSION: Nondescript loop of air distended bowel within the right hemiabdomen, measuring 4.9 cm in diameter. This finding is nonspecific. If there is concern for developing small bowel obstruction, consider short-interval radiographic follow-up or a CT abdomen/pelvis for further evaluation. Electronically Signed   By: Kellie Simmering D.O.   On: 09/28/2021 13:07        Scheduled Meds:  arformoterol  15 mcg Nebulization BID   azithromycin  500 mg Oral q1800   budesonide (PULMICORT) nebulizer solution  0.25 mg Nebulization BID   Chlorhexidine Gluconate Cloth  6 each Topical Daily   ciprofloxacin  2 drop Left Eye Q4H while awake   donepezil  5 mg Oral QHS   enoxaparin (LOVENOX) injection  40 mg Subcutaneous Q24H   feeding supplement  237 mL Oral BID BM   guaiFENesin  600 mg Oral BID   latanoprost  1 drop Both Eyes QHS   multivitamin with minerals  1 tablet Oral Daily   nicotine  14 mg Transdermal Daily   mouth rinse  15 mL Mouth Rinse 4 times per day   potassium chloride  40 mEq Oral Q4H   predniSONE  40 mg Oral Q breakfast   revefenacin  175 mcg Nebulization Daily   Continuous Infusions:  ampicillin-sulbactam (UNASYN) IV 3 g (09/29/21 1220)   potassium chloride       LOS: 3 days     Time spent: 35 minutes.     Elmarie Shiley, MD [facility]   If 7PM-7AM, please contact night-coverage [email or web address]  09/29/2021, 12:54 PM

## 2021-09-29 NOTE — Progress Notes (Signed)
PHARMACIST - PHYSICIAN COMMUNICATION  CONCERNING: Antibiotic IV to Oral Route Change Policy  RECOMMENDATION: This patient is receiving azithromycin by the intravenous route.  Based on criteria approved by the Pharmacy and Therapeutics Committee, the antibiotic(s) is/are being converted to the equivalent oral dose form(s).   DESCRIPTION: These criteria include:  Patient being treated for a respiratory tract infection, urinary tract infection, cellulitis or clostridium difficile associated diarrhea if on metronidazole  The patient is not neutropenic and does not exhibit a GI malabsorption state  The patient is eating (either orally or via tube) and/or has been taking other orally administered medications for a least 24 hours  The patient is improving clinically and has a Tmax < 100.5  If you have questions about this conversion, please contact the Pharmacy Department  []  ( [phone] )  [place] []  ( [phone]  [facility] []  ( [phone]  [place] []  ( [phone] )  [facility] [x]  ( [phone] )  [facility]  

## 2021-09-30 DIAGNOSIS — J189 Pneumonia, unspecified organism: Secondary | ICD-10-CM | POA: Diagnosis not present

## 2021-09-30 LAB — CBC
HCT: 35.8 % — ABNORMAL LOW (ref 39.0–52.0)
Hemoglobin: 11.9 g/dL — ABNORMAL LOW (ref 13.0–17.0)
MCH: 33.2 pg (ref 26.0–34.0)
MCHC: 33.2 g/dL (ref 30.0–36.0)
MCV: 100 fL (ref 80.0–100.0)
Platelets: 338 10*3/uL (ref 150–400)
RBC: 3.58 MIL/uL — ABNORMAL LOW (ref 4.22–5.81)
RDW: 13.4 % (ref 11.5–15.5)
WBC: 10.8 10*3/uL — ABNORMAL HIGH (ref 4.0–10.5)
nRBC: 0 % (ref 0.0–0.2)

## 2021-09-30 LAB — BASIC METABOLIC PANEL
Anion gap: 6 (ref 5–15)
BUN: 11 mg/dL (ref 8–23)
CO2: 37 mmol/L — ABNORMAL HIGH (ref 22–32)
Calcium: 8.6 mg/dL — ABNORMAL LOW (ref 8.9–10.3)
Chloride: 100 mmol/L (ref 98–111)
Creatinine, Ser: 0.89 mg/dL (ref 0.61–1.24)
GFR, Estimated: >60 mL/min (ref >60)
Glucose, Bld: 95 mg/dL (ref 70–99)
Potassium: 3 mmol/L — ABNORMAL LOW (ref 3.5–5.1)
Sodium: 143 mmol/L (ref 135–145)

## 2021-09-30 LAB — MAGNESIUM: Magnesium: 1.9 mg/dL (ref 1.7–2.4)

## 2021-09-30 MED ORDER — POTASSIUM CHLORIDE CRYS ER 20 MEQ PO TBCR
40.0000 meq | EXTENDED_RELEASE_TABLET | ORAL | Status: AC
  Administered 2021-09-30 (×2): 40 meq via ORAL
  Filled 2021-09-30 (×2): qty 2

## 2021-09-30 NOTE — Progress Notes (Addendum)
Physical Therapy Treatment Patient Details Name: Travis Hicks MRN: [id number] DOB: [birth year] Today's Date: 09/30/2021   History of Present Illness Travis Hicks is a 71 y.o. male with a PMH significant for COPD, tobacco use  overactive bladder, and ETOH use who presented to the ED 8/7 for complaints of shortness of breath and hypoxia from PCP office.Chest x-ray on admission positive for right upper lobe pneumonia.  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  Assisted OOB to amb in hallway while monitoring sats went well.  General bed mobility comments: increased time. Self able.  Reports dyspnea (breathing out) with movement.  General transfer comment: good use of hands to staedy self.  Good safety cognition.General Gait Details: tolerated amb a functional distance withy NO AD needed.  Good alternating gait.  Trial RA decreased to 85% with HR 114.  Requires 3 lts during avtivity to achieve sats >90%.  Requires 2 lts at rest.  Cough is congested/junky.  Encouraged use of flutter.  Educated he will need home oxygen but can works towards weaning with increased activity once home. Will consult LPT, pt may need HH PT to monitor/wean pt off oxygen.   SATURATION QUALIFICATIONS: (This note is used to comply with regulatory documentation for home oxygen)   Patient Saturations on Room Air at Rest =88%   Patient Saturations on Room Air while Ambulating > 75 feet = 85%   Patient Saturations on 3 Liters of oxygen while Ambulating >75 feet = 90%   Please briefly explain why patient needs home oxygen:  Currently pt requires 2 lts at rest and 3 lts with activity to achieve sats at/above Therapeutic level.    Recommendations for follow up therapy are one component of a multi-disciplinary discharge planning process, led by the attending physician.  Recommendations may be updated based on patient status, additional functional criteria and  insurance authorization.  Follow Up Recommendations  Home health PT     Assistance Recommended at Discharge PRN  Patient can return home with the following A little help with walking and/or transfers   Equipment Recommendations  None recommended by PT    Recommendations for Other Services       Precautions / Restrictions Precautions Precautions: None Precaution Comments: monitor sats, COPD Restrictions Weight Bearing Restrictions: No     Mobility  Bed Mobility Overal bed mobility: Needs Assistance Bed Mobility: Supine to Sit, Sit to Supine     Supine to sit: Supervision Sit to supine: Supervision   General bed mobility comments: increased time. Self able.  Reports dyspnea (breathing out) with movement    Transfers Overall transfer level: Needs assistance Equipment used: None Transfers: Sit to/from Stand Sit to Stand: Supervision           General transfer comment: good use of hands to staedy self.  Good safety cognition.    Ambulation/Gait Ambulation/Gait assistance: Supervision Gait Distance (Feet): 135 Feet Assistive device: None Gait Pattern/deviations: WFL(Within Functional Limits) Gait velocity: decreased     General Gait Details: tolerated amb a functional distance withy NO AD needed.  Good alternating gait.  Trial RA decreased to 85% with HR 114.  Requires 3 lts during avtivity to achieve sats >90%.  Requires 2 lts at rest.  Cough is congested/junky.  Encouraged use of flutter.  Educated he will need home oxygen but can works towards weaning with increased activity once home.   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        Exercises      General Comments        Pertinent Vitals/Pain Pain Assessment Pain Assessment: No/denies pain    Home Living                          Prior Function            PT Goals (current goals can now be found in the care plan section) Progress towards PT goals: Progressing toward goals    Frequency    Min 3X/week      PT Plan Current plan remains appropriate    Co-evaluation              AM-PAC PT "6 Clicks" Mobility   Outcome Measure  Help needed turning from your back to your side while in a flat bed without using bedrails?: A Little Help needed moving from lying on your back to sitting on the side of a flat bed without using bedrails?: A Little Help needed moving to and from a bed to a chair (including a wheelchair)?: A Little Help needed standing up from a chair using your arms (e.g., wheelchair or bedside chair)?: A Little Help needed to walk in hospital room?: A Little Help needed climbing 3-5 steps with a railing? : A Little 6 Click Score: 18    End of Session Equipment Utilized During Treatment: Oxygen;Gait belt Activity Tolerance: Patient tolerated treatment well Patient left: in bed;with call bell/phone within reach;with bed alarm set Nurse Communication: Mobility status PT Visit Diagnosis: Unsteadiness on feet (R26.81)     Time: 3546-5681 PT Time Calculation (min) (ACUTE ONLY): 16 min  Charges:  $Gait Training: 8-22 mins                     {Hana Trippett  PTA Acute  [employer] M-F          [phone] Weekend pager [phone]

## 2021-09-30 NOTE — Progress Notes (Signed)
Refusal of SQ lovenox today & 8/10. SCD's on & ambulating with assistance.

## 2021-09-30 NOTE — TOC Progression Note (Signed)
Transition of Care [facility]) - Progression Note    Patient Details  Name: Travis Hicks MRN: [id number] Date of Birth: 11-Nov-1950  [facility] CM/SW Contact  Rosemae Mcquown, Juliann Pulse, RN Phone Number: 09/30/2021, 2:26 PM  Clinical Narrative:   [facility] for home 02-rep Danielle aware of orders, & sats;Checking on [place] agency to accept for HHPT/OT-await response.    Expected Discharge Plan: [place] Barriers to Discharge: Continued Medical Work up  Expected Discharge Plan and Services Expected Discharge Plan: [place]   Discharge Planning Services: CM Consult Post Acute Care Choice: Home Health, Durable Medical Equipment Living arrangements for the past 2 months: Single Family Home                 DME Arranged: Oxygen DME Agency: [facility] Date DME Agency Contacted: 09/30/21 Time DME Agency Contacted: [phone] Representative spoke with at DME Agency: Andee Poles             Social Determinants of Health ([place]) Interventions    Readmission Risk Interventions     No data to display

## 2021-09-30 NOTE — Progress Notes (Signed)
PROGRESS NOTE    Travis Hicks  UUV:[id number] DOB: 26-Feb-1950 DOA: 09/26/2021 PCP: Cassandria Anger, MD   Brief Narrative: 71 year old with past medical history significant for COPD, tobacco abuse, alcohol abuse, glaucoma presents with shortness of breath and hypoxia from pulmonology office.  He was found to be hypoxic with oxygen saturation 70% on room air.  He was placed on 3 L of oxygen and transferred to the ED.  Chest x-ray positive for right upper lobe pneumonia.  CTA chest negative for PE.  Findings suggestive of atypical infection, mediastinal adenopathy.  [name]  He has been receiving IV antibiotics, IVF steroids subsequently transition to oral prednisone.    Assessment & Plan:   Principal Problem:   CAP (community acquired pneumonia) Active Problems:   Tobacco abuse   Hematuria   Glaucoma   Malnutrition ([place])   COPD exacerbation (HCC)   Hypokalemia   Dementia without behavioral disturbance (HCC)   Acute respiratory failure with hypoxia (HCC)   Sepsis (HCC)   History of ETOH abuse   AKI (acute kidney injury) ([place])   Protein-calorie malnutrition, severe   1-Acute Hypoxic respiratory failure: COPD exacerbation and  community-acquired pneumonia: Respiratory panel negative.  Strept Pneumonia negative. Legionella antigen negative.  Follow sputum culture.  Few normal respiratory flora, no a staph or Pseudomonas. On IV ceftriaxone and azithromycin. Change ceftriaxone to Unasyn 8/09.[place] Kitchen  Speech swallow for aspiration. ;  Recommended thin liquid regular diet. Continue with LABA LAMA ICS.  Patient can be discharged on Trelegy at discharge. On prednisone Improving. Hopefully home tomorrow.   Hypokalemia: Replete orally.   AKI: Received IV fluids.  Resolved  Dementia: Continue with Aricept  Mediastinal lymphadenopathy: CT follow-up  Constipation;   Report feeling abdominal fullness. No passing gas.  KUB: Nondescript loop of air distended bowel within the right CT abdomen pelvis: Negative He had a bowel movement.  Severe malnutrition in context of chronic illness.  Started on supplement.     Estimated body mass index is 19.98 kg/m as calculated from the following:   Height as of this encounter: '6\' 1"'$  (1.854 m).   Weight as of this encounter: 68.7 kg.   DVT prophylaxis: Lovenox Code Status: Full code Family Communication: care discussed with patient.  Disposition Plan:  Status is: Inpatient Remains inpatient appropriate because: resp failure and PNA    Consultants:  Pulmonologist   Procedures:    Antimicrobials:    Subjective: He is breathing better, cough persist. Plan to walk today.   Objective: Vitals:   09/29/21 2046 09/29/21 2100 09/30/21 0500 09/30/21 0818  BP: (!) 116/96  (!) 155/90   Pulse: 65  81   Resp: 18  16   Temp: (!) 97.5 F (36.4 C) 97.8 F (36.6 C) (!) 97.5 F (36.4 C)   TempSrc: Oral Oral Oral   SpO2: 95%  93% 94%  Weight:      Height:        Intake/Output Summary (Last 24 hours) at 09/30/2021 1337 Last data filed at 09/30/2021 1330 Gross per 24 hour  Intake 1671.67 ml  Output 2275 ml  Net -603.33 ml    Filed Weights   09/26/21 1543 09/27/21 0300  Weight: 65.8 kg 68.7 kg    Examination:  General exam: NAD Respiratory system: BL ronchus.  Cardiovascular system: S 1, S 2 RRR Gastrointestinal system: BS present, soft, soft, nt Central nervous system: Alert, follows command Extremities: no edema  Data Reviewed: I have personally reviewed following labs and imaging studies  CBC: Recent Labs  Lab 09/26/21 1549 09/27/21 0608 09/28/21 0249 09/29/21 1013 09/30/21 0514  WBC 10.9* 9.9 12.5* 12.2* 10.8*  NEUTROABS 9.2* 9.0*  --   --   --   HGB 14.2 13.4 11.6* 12.7* 11.9*  HCT 41.9 41.1 36.2* 38.8* 35.8*  MCV 100.5* 103.0* 103.4* 100.5* 100.0  PLT 264 219 251 324 338     Basic Metabolic Panel: Recent Labs  Lab 09/26/21 1549 09/27/21 0608 09/28/21 0249 09/29/21 1013 09/30/21 0514  NA 140 140 140 142 143  K 2.9* 3.7 3.5 2.7* 3.0*  CL 96* 100 99 95* 100  CO2 '29 31 31 '$ 37* 37*  GLUCOSE 199* 189* 158* 152* 95  BUN '16 12 18 14 11  '$ CREATININE 1.38* 0.97 0.93 0.81 0.89  CALCIUM 8.9 8.3* 8.2* 8.8* 8.6*  MG 1.9  --   --   --  1.9  PHOS 4.7*  --   --   --   --     GFR: Estimated Creatinine Clearance: 74 mL/min (by C-G formula based on SCr of 0.89 mg/dL). Liver Function Tests: Recent Labs  Lab 09/26/21 1549 09/27/21 0608  AST 47*  46* 38  ALT '22  23 21  '$ ALKPHOS 93  96 81  BILITOT 1.6*  1.6* 0.7  PROT 7.8  7.7 7.0  ALBUMIN 3.1*  3.2* 2.9*    No results for input(s): "LIPASE", "AMYLASE" in the last 168 hours. No results for input(s): "AMMONIA" in the last 168 hours. Coagulation Profile: Recent Labs  Lab 09/26/21 2150  INR 0.9    Cardiac Enzymes: Recent Labs  Lab 09/26/21 1549  CKTOTAL 157    BNP (last 3 results) No results for input(s): "PROBNP" in the last 8760 hours. HbA1C: No results for input(s): "HGBA1C" in the last 72 hours. CBG: No results for input(s): "GLUCAP" in the last 168 hours. Lipid Profile: No results for input(s): "CHOL", "HDL", "LDLCALC", "TRIG", "CHOLHDL", "LDLDIRECT" in the last 72 hours. Thyroid Function Tests: Recent Labs    09/28/21 0759  FREET4 0.88  T3FREE 1.5*    Anemia Panel: No results for input(s): "VITAMINB12", "FOLATE", "FERRITIN", "TIBC", "IRON", "RETICCTPCT" in the last 72 hours. Sepsis Labs: Recent Labs  Lab 09/26/21 1549 09/26/21 1814 09/26/21 2150 09/26/21 2359  PROCALCITON 0.32  --   --   --   LATICACIDVEN 2.9* 2.0* 1.7 1.5     Recent Results (from the past 240 hour(s))  Resp Panel by RT-PCR (Flu A&B, Covid) Anterior Nasal Swab     Status: None   Collection Time: 09/26/21  3:50 PM   Specimen: Anterior Nasal Swab  Result Value Ref Range Status   SARS Coronavirus 2  by RT PCR NEGATIVE NEGATIVE Final    Comment: (NOTE) SARS-CoV-2 target nucleic acids are NOT DETECTED.  The SARS-CoV-2 RNA is generally detectable in upper respiratory specimens during the acute phase of infection. The lowest concentration of SARS-CoV-2 viral copies this assay can detect is 138 copies/mL. A negative result does not preclude SARS-Cov-2 infection and should not be used as the sole basis for treatment or other patient management decisions. A negative result may occur with  improper specimen collection/handling, submission of specimen other than nasopharyngeal swab, presence of viral mutation(s) within the areas targeted by this assay, and inadequate number of viral copies(<138 copies/mL). A negative result must be combined with clinical observations, patient history, and epidemiological information. The expected result is Negative.  Fact Sheet for Patients:  EntrepreneurPulse.com.au  Fact Sheet for Healthcare Providers:  IncredibleEmployment.be  This test is no t yet approved or cleared by the [place] FDA and  has been authorized for detection and/or diagnosis of SARS-CoV-2 by FDA under an Emergency Use Authorization (EUA). This EUA will remain  in effect (meaning this test can be used) for the duration of the COVID-19 declaration under Section 564(b)(1) of the Act, 21 U.S.C.section 360bbb-3(b)(1), unless the authorization is terminated  or revoked sooner.       Influenza A by PCR NEGATIVE NEGATIVE Final   Influenza B by PCR NEGATIVE NEGATIVE Final    Comment: (NOTE) The Xpert Xpress SARS-CoV-2/FLU/RSV plus assay is intended as an aid in the diagnosis of influenza from Nasopharyngeal swab specimens and should not be used as a sole basis for treatment. Nasal washings and aspirates are unacceptable for Xpert Xpress SARS-CoV-2/FLU/RSV testing.  Fact Sheet for Patients: EntrepreneurPulse.com.au  Fact  Sheet for Healthcare Providers: IncredibleEmployment.be  This test is not yet approved or cleared by the [place] FDA and has been authorized for detection and/or diagnosis of SARS-CoV-2 by FDA under an Emergency Use Authorization (EUA). This EUA will remain in effect (meaning this test can be used) for the duration of the COVID-19 declaration under Section 564(b)(1) of the Act, 21 U.S.C. section 360bbb-3(b)(1), unless the authorization is terminated or revoked.  Performed at [facility], [place] [address], [place], [place] [address]   Culture, blood (routine x 2)     Status: None (Preliminary result)   Collection Time: 09/26/21  4:06 PM   Specimen: BLOOD  Result Value Ref Range Status   Specimen Description   Final    BLOOD BLOOD LEFT FOREARM Performed at [facility] [address], [place], [place] [address]    Special Requests   Final    BOTTLES DRAWN AEROBIC AND ANAEROBIC Blood Culture adequate volume Performed at [place] [address], [place], [place] [address]    Culture   Final    NO GROWTH 4 DAYS Performed at [facility], [place] [address], [place], [place] [address]    Report Status PENDING  Incomplete  Culture, blood (routine x 2)     Status: None (Preliminary result)   Collection Time: 09/26/21  4:13 PM   Specimen: BLOOD  Result Value Ref Range Status   Specimen Description   Final    BLOOD BLOOD RIGHT FOREARM Performed at [facility] [address], [place], [place] [address]    Special Requests   Final    BOTTLES DRAWN AEROBIC AND ANAEROBIC Blood Culture adequate volume Performed at [place] [address] [place], [place] [address]    Culture   Final    NO GROWTH 4 DAYS Performed at [facility], [place] [address], [place], [place] [address]    Report Status PENDING  Incomplete  Urine Culture     Status: None    Collection Time: 09/26/21  8:08 PM   Specimen: Urine, Clean Catch  Result Value Ref Range Status   Specimen Description   Final    URINE, CLEAN CATCH Performed at [facility], [place] [address] [place], [place] [address]    Special Requests   Final    NONE Performed at [facility], [place] [address], [place],  [address]    Culture   Final    NO GROWTH Performed  at [facility], [place] [address] [place], [place] [address]    Report Status 09/28/2021 FINAL  Final  Respiratory (~20 pathogens) panel by PCR     Status: None   Collection Time: 09/26/21  9:50 PM   Specimen: Nasopharyngeal Swab; Respiratory  Result Value Ref Range Status   Adenovirus NOT DETECTED NOT DETECTED Final   Coronavirus 229E NOT DETECTED NOT DETECTED Final    Comment: (NOTE) The Coronavirus on the Respiratory Panel, DOES NOT test for the novel  Coronavirus (2019 nCoV)    Coronavirus HKU1 NOT DETECTED NOT DETECTED Final   Coronavirus NL63 NOT DETECTED NOT DETECTED Final   Coronavirus OC43 NOT DETECTED NOT DETECTED Final   Metapneumovirus NOT DETECTED NOT DETECTED Final   Rhinovirus / Enterovirus NOT DETECTED NOT DETECTED Final   Influenza A NOT DETECTED NOT DETECTED Final   Influenza B NOT DETECTED NOT DETECTED Final   Parainfluenza Virus 1 NOT DETECTED NOT DETECTED Final   Parainfluenza Virus 2 NOT DETECTED NOT DETECTED Final   Parainfluenza Virus 3 NOT DETECTED NOT DETECTED Final   Parainfluenza Virus 4 NOT DETECTED NOT DETECTED Final   Respiratory Syncytial Virus NOT DETECTED NOT DETECTED Final   Bordetella pertussis NOT DETECTED NOT DETECTED Final   Bordetella Parapertussis NOT DETECTED NOT DETECTED Final   Chlamydophila pneumoniae NOT DETECTED NOT DETECTED Final   Mycoplasma pneumoniae NOT DETECTED NOT DETECTED Final    Comment: Performed at [facility], [place] [address], [place], [place] [address]  Respiratory (~20 pathogens) panel by PCR     Status:  None   Collection Time: 09/27/21  2:56 AM   Specimen: Nasopharyngeal Swab; Respiratory  Result Value Ref Range Status   Adenovirus NOT DETECTED NOT DETECTED Final   Coronavirus 229E NOT DETECTED NOT DETECTED Final    Comment: (NOTE) The Coronavirus on the Respiratory Panel, DOES NOT test for the novel  Coronavirus (2019 nCoV)    Coronavirus HKU1 NOT DETECTED NOT DETECTED Final   Coronavirus NL63 NOT DETECTED NOT DETECTED Final   Coronavirus OC43 NOT DETECTED NOT DETECTED Final   Metapneumovirus NOT DETECTED NOT DETECTED Final   Rhinovirus / Enterovirus NOT DETECTED NOT DETECTED Final   Influenza A NOT DETECTED NOT DETECTED Final   Influenza B NOT DETECTED NOT DETECTED Final   Parainfluenza Virus 1 NOT DETECTED NOT DETECTED Final   Parainfluenza Virus 2 NOT DETECTED NOT DETECTED Final   Parainfluenza Virus 3 NOT DETECTED NOT DETECTED Final   Parainfluenza Virus 4 NOT DETECTED NOT DETECTED Final   Respiratory Syncytial Virus NOT DETECTED NOT DETECTED Final   Bordetella pertussis NOT DETECTED NOT DETECTED Final   Bordetella Parapertussis NOT DETECTED NOT DETECTED Final   Chlamydophila pneumoniae NOT DETECTED NOT DETECTED Final   Mycoplasma pneumoniae NOT DETECTED NOT DETECTED Final    Comment: Performed at [facility], [place] [address], [place], [place] [address]  MRSA Next Gen by PCR, Nasal     Status: None   Collection Time: 09/27/21  2:56 AM   Specimen: Nasal Mucosa; Nasal Swab  Result Value Ref Range Status   MRSA by PCR Next Gen NOT DETECTED NOT DETECTED Final    Comment: (NOTE) The GeneXpert MRSA Assay (FDA approved for NASAL specimens only), is one component of a comprehensive MRSA colonization surveillance program. It is not intended to diagnose MRSA infection nor to guide or monitor treatment for MRSA infections. Test performance is not FDA approved in patients less than 9 years old. Performed  at [facility], [place] [address] [place], [place]  [address]   Culture, blood (routine x 2) Call MD if unable to obtain prior to antibiotics being given     Status: None (Preliminary result)   Collection Time: 09/27/21  6:08 AM   Specimen: BLOOD LEFT ARM  Result Value Ref Range Status   Specimen Description   Final    BLOOD LEFT ARM Performed at [place] [address] [place], [place] [address]    Special Requests   Final    BOTTLES DRAWN AEROBIC AND ANAEROBIC Blood Culture adequate volume Performed at [place] [address], [place], [place] [address]    Culture   Final    NO GROWTH 3 DAYS Performed at [facility], [place] [address] [place], [place] [address]    Report Status PENDING  Incomplete  Culture, blood (routine x 2) Call MD if unable to obtain prior to antibiotics being given     Status: None (Preliminary result)   Collection Time: 09/27/21  6:17 AM   Specimen: BLOOD LEFT WRIST  Result Value Ref Range Status   Specimen Description   Final    BLOOD LEFT WRIST Performed at [place] [address] [place], [place] [address]    Special Requests   Final    BOTTLES DRAWN AEROBIC AND ANAEROBIC Blood Culture adequate volume Performed at [place] [address], [place], [place] [address]    Culture   Final    NO GROWTH 3 DAYS Performed at [facility], [place] [address], [place], [place] [address]    Report Status PENDING  Incomplete  Expectorated Sputum Assessment w Gram Stain, Rflx to Resp Cult     Status: None   Collection Time: 09/27/21 12:24 PM   Specimen: Expectorated Sputum  Result Value Ref Range Status   Specimen Description EXPECTORATED SPUTUM  Final   Special Requests NONE  Final   Sputum evaluation   Final    THIS SPECIMEN IS ACCEPTABLE FOR SPUTUM CULTURE Performed at [facility], [place] [address], [place], [place] [address]    Report Status 09/27/2021 FINAL  Final  Culture, Respiratory  w Gram Stain     Status: None   Collection Time: 09/27/21 12:24 PM  Result Value Ref Range Status   Specimen Description   Final    EXPECTORATED SPUTUM Performed at [place] [address] [place], [place] [address]    Special Requests   Final    NONE Reflexed from [id number] Performed at [facility], [place] [address], [place], [place] [address]    Gram Stain   Final    MODERATE SQUAMOUS EPITHELIAL CELLS PRESENT MODERATE WBC PRESENT,BOTH PMN AND MONONUCLEAR FEW GRAM VARIABLE ROD FEW GRAM NEGATIVE RODS FEW GRAM POSITIVE COCCI IN PAIRS AND CHAINS    Culture   Final    FEW Normal respiratory flora-no Staph aureus or Pseudomonas seen Performed at [facility], [place] [address] [place], [place] [address]    Report Status 09/29/2021 FINAL  Final         Radiology Studies: CT ABDOMEN PELVIS WO CONTRAST  Result Date: 09/28/2021 CLINICAL DATA:  Bowel obstruction suspected EXAM: CT ABDOMEN AND PELVIS WITHOUT CONTRAST TECHNIQUE: Multidetector CT imaging of the abdomen and pelvis was performed following the standard protocol without IV contrast. RADIATION DOSE REDUCTION: This exam was performed according to the departmental  dose-optimization program which includes automated exposure control, adjustment of the mA and/or kV according to patient size and/or use of iterative reconstruction technique. COMPARISON:  09/26/2021 FINDINGS: Lower chest: Dependent bibasilar opacities, likely atelectasis. No effusions. Hepatobiliary: No focal hepatic abnormality. Gallbladder unremarkable. Pancreas: No focal abnormality or ductal dilatation. Spleen: No focal abnormality.  Normal size. Adrenals/Urinary Tract: 14 mm low-density lesion in the midpole of the left kidney compatible with cyst, stable. No follow-up imaging recommended. No stones or hydronephrosis. Adrenal glands and urinary bladder unremarkable. Stomach/Bowel: Normal appendix. Stomach, large and small bowel  grossly unremarkable. Vascular/Lymphatic: Aortic atherosclerosis. No evidence of aneurysm or adenopathy. Reproductive: No visible focal abnormality. Other: No free fluid or free air. Musculoskeletal: No acute bony abnormality. IMPRESSION: Dependent bibasilar opacities, likely atelectasis. Aortic atherosclerosis. No acute findings in the abdomen or pelvis. Electronically Signed   By: Rolm Baptise M.D.   On: 09/28/2021 22:30        Scheduled Meds:  arformoterol  15 mcg Nebulization BID   azithromycin  500 mg Oral q1800   budesonide (PULMICORT) nebulizer solution  0.25 mg Nebulization BID   Chlorhexidine Gluconate Cloth  6 each Topical Daily   ciprofloxacin  2 drop Left Eye Q4H while awake   donepezil  5 mg Oral QHS   enoxaparin (LOVENOX) injection  40 mg Subcutaneous Q24H   feeding supplement  237 mL Oral BID BM   guaiFENesin  600 mg Oral BID   latanoprost  1 drop Both Eyes QHS   multivitamin with minerals  1 tablet Oral Daily   nicotine  14 mg Transdermal Daily   mouth rinse  15 mL Mouth Rinse 4 times per day   predniSONE  40 mg Oral Q breakfast   revefenacin  175 mcg Nebulization Daily   Continuous Infusions:  ampicillin-sulbactam (UNASYN) IV 3 g (09/30/21 1323)     LOS: 4 days    Time spent: 35 minutes.     Elmarie Shiley, MD [facility]   If 7PM-7AM, please contact night-coverage [email or web address]  09/30/2021, 1:37 PM

## 2021-09-30 NOTE — Progress Notes (Signed)
PHYSICAL THERAPY  SATURATION QUALIFICATIONS: (This note is used to comply with regulatory documentation for home oxygen)  Patient Saturations on Room Air at Rest =88%  Patient Saturations on Room Air while Ambulating > 75 feet = 85%  Patient Saturations on 3 Liters of oxygen while Ambulating >75 feet = 90%  Please briefly explain why patient needs home oxygen:  Currently pt requires 2 lts at rest and 3 lts with activity to achieve sats at/above Therapeutic level.    Rica Koyanagi  PTA Acute  Rehabilitation Services Office M-F          [phone] Weekend pager [phone]

## 2021-10-01 DIAGNOSIS — J189 Pneumonia, unspecified organism: Secondary | ICD-10-CM | POA: Diagnosis not present

## 2021-10-01 LAB — BASIC METABOLIC PANEL
Anion gap: 7 (ref 5–15)
BUN: 8 mg/dL (ref 8–23)
CO2: 38 mmol/L — ABNORMAL HIGH (ref 22–32)
Calcium: 8.9 mg/dL (ref 8.9–10.3)
Chloride: 96 mmol/L — ABNORMAL LOW (ref 98–111)
Creatinine, Ser: 0.78 mg/dL (ref 0.61–1.24)
GFR, Estimated: >60 mL/min (ref >60)
Glucose, Bld: 134 mg/dL — ABNORMAL HIGH (ref 70–99)
Potassium: 3.7 mmol/L (ref 3.5–5.1)
Sodium: 141 mmol/L (ref 135–145)

## 2021-10-01 LAB — CULTURE, BLOOD (ROUTINE X 2)
Culture: NO GROWTH
Culture: NO GROWTH
Special Requests: ADEQUATE
Special Requests: ADEQUATE

## 2021-10-01 MED ORDER — AMOXICILLIN-POT CLAVULANATE 875-125 MG PO TABS
1.0000 | ORAL_TABLET | Freq: Two times a day (BID) | ORAL | 0 refills | Status: AC
Start: 2021-10-01 — End: 2021-10-03

## 2021-10-01 MED ORDER — ARNUITY ELLIPTA 50 MCG/ACT IN AEPB: 1.0000 | INHALATION_SPRAY | Freq: Every day | RESPIRATORY_TRACT | 0 refills | Status: DC

## 2021-10-01 MED ORDER — GUAIFENESIN ER 600 MG PO TB12: 600.0000 mg | ORAL_TABLET | Freq: Two times a day (BID) | ORAL | 1 refills | Status: AC

## 2021-10-01 MED ORDER — NICOTINE 14 MG/24HR TD PT24: 14.0000 mg | MEDICATED_PATCH | Freq: Every day | TRANSDERMAL | 0 refills | Status: DC

## 2021-10-01 MED ORDER — ALBUTEROL SULFATE (2.5 MG/3ML) 0.083% IN NEBU: 2.5000 mg | INHALATION_SOLUTION | Freq: Four times a day (QID) | RESPIRATORY_TRACT | 12 refills | Status: DC | PRN

## 2021-10-01 MED ORDER — ADULT MULTIVITAMIN W/MINERALS CH
1.0000 | ORAL_TABLET | Freq: Every day | ORAL | 0 refills | Status: AC
Start: 2021-10-02 — End: ?

## 2021-10-01 NOTE — TOC Transition Note (Signed)
Transition of Care [facility]) - CM/SW Discharge Note   Patient Details  Name: Travis Hicks MRN: [id number] Date of Birth: 1950/10/09  Transition of [facility] CM/SW Contact:  Ross Ludwig, LCSW Phone Number: 10/01/2021, 10:48 AM   Clinical Narrative:     CSW was informed that patient will need a nebulizer.  CSW spoke to [place], they will provide one for patient before she discharges.  CSW unable to find [facility] PT and OT due to her insurance.  CSW to sign off, please reconsult if social work needs arise.  Patient will be discharging home with a nebulizer and oxygen.  Final next level of care: [place] Barriers to Discharge: Barriers Resolved   Patient Goals and CMS Choice Patient states their goals for this hospitalization and ongoing recovery are:: To return back home with [facility]. CMS Medicare.gov Compare Post Acute Care list provided to:: Patient Choice offered to / list presented to : Patient  Discharge Placement                       Discharge Plan and Services   Discharge Planning Services: CM Consult Post Acute Care Choice: Home Health, Durable Medical Equipment          DME Arranged: Oxygen, Nebulizer machine DME Agency: [employer] Date DME Agency Contacted: 10/01/21 Time DME Agency Contacted: [phone] Representative spoke with at DME Agency: [place]: PT, OT          Social Determinants of Health ([place]) Interventions     Readmission Risk Interventions     No data to display

## 2021-10-01 NOTE — Discharge Summary (Addendum)
Physician Discharge Summary   Patient: Travis Hicks MRN: [id number] DOB: 1950-10-06  Admit date:     09/26/2021  Discharge date: 10/01/21  Discharge Physician: Travis Hicks   PCP: Travis Anger, MD   Recommendations at discharge:    PCP and pulmonologist for resolution of PNA>  Consider chantix out patient for smoking cessation.  Needs CT chest follow up on Mediastinal lymphadenopathy.   Discharge Diagnoses: Principal Problem:   CAP (community acquired pneumonia) Active Problems:   Tobacco abuse   Hematuria   Glaucoma   Malnutrition ([place])   COPD exacerbation (HCC)   Hypokalemia   Dementia without behavioral disturbance (HCC)   Acute respiratory failure with hypoxia (HCC)   Sepsis (HCC)   History of ETOH abuse   AKI (acute kidney injury) ([place])   Protein-calorie malnutrition, severe  Resolved Problems:   * No resolved hospital problems. *  Hospital Course: 71 year old with past medical history significant for COPD, tobacco abuse, alcohol abuse, glaucoma presents with shortness of breath and hypoxia from pulmonology office.  He was found to be hypoxic with oxygen saturation 70% on room air.  He was placed on 3 L of oxygen and transferred to the ED.  Chest x-ray positive for right upper lobe pneumonia.  CTA chest negative for PE.  Findings suggestive of atypical infection, mediastinal adenopathy.   [name]  He has been receiving IV antibiotics, IVF steroids subsequently transition to oral prednisone.    Assessment and Plan:   1-Acute Hypoxic respiratory failure: COPD exacerbation and  community-acquired pneumonia: Respiratory panel negative.  Strept Pneumonia negative. Legionella antigen negative.  Follow sputum culture.  Few normal respiratory flora, no a staph or Pseudomonas. Treated IV Unasyn  and azithromycin. For 5 days in the hsopital. He will  be discharge on Augmentin for 2 more days.  Speech swallow for aspiration. ;  Recommended thin liquid regular diet. Continue with LABA LAMA ICS.  Discharge on Arnuity and Anoro.  Completed prednisone.  Improved. Home today.    Hypokalemia: Replaced.    AKI: Received IV fluids.  Resolved   Dementia: Continue with Aricept   Mediastinal lymphadenopathy: CT follow-up   Constipation;  Report feeling abdominal fullness. No passing gas.  KUB: Nondescript loop of air distended bowel within the right CT abdomen pelvis: Negative He had a bowel movement.   Severe malnutrition in context of chronic illness.  Started on supplement.    Sepsis/ Clarification not severe sepsis. No pressors or hypotension, and lactic acidosis could have be related to hypoxia.  POA In setting of PNA.  Lactic acid 2.  Tachycardia, tachypnea.         Consultants: Pulmonologist  Procedures performed: None Disposition: Home Diet recommendation:  Discharge Diet Orders (From admission, onward)     Start     Ordered   10/01/21 0000  Diet - low sodium heart healthy        10/01/21 1011           Cardiac diet DISCHARGE MEDICATION: Allergies as of 10/01/2021       Reactions   Viagra [sildenafil Citrate] Other (See Comments)   Unknown per Pt        Medication List     TAKE these medications    albuterol (2.5 MG/3ML) 0.083% nebulizer solution Commonly known as: PROVENTIL Take 3 mLs (2.5 mg total) by nebulization every 6 (six) hours as needed for wheezing.   amoxicillin-clavulanate  875-125 MG tablet Commonly known as: AUGMENTIN Take 1 tablet by mouth 2 (two) times daily for 2 days.   Anoro Ellipta 62.5-25 MCG/ACT Aepb Generic drug: umeclidinium-vilanterol Inhale 1 puff into the lungs daily.   Arnuity Ellipta 50 MCG/ACT Aepb Generic drug: Fluticasone Furoate Inhale 1 Inhalation into the lungs daily.   b complex vitamins tablet Take 1 tablet by mouth daily.   donepezil 5 MG  tablet Commonly known as: ARICEPT Take 1 tablet (5 mg total) by mouth at bedtime.   guaiFENesin 600 MG 12 hr tablet Commonly known as: MUCINEX Take 1 tablet (600 mg total) by mouth 2 (two) times daily.   latanoprost 0.005 % ophthalmic solution Commonly known as: XALATAN Place 1 drop into the left eye at bedtime.   multivitamin with minerals Tabs tablet Take 1 tablet by mouth daily. Start taking on: October 02, 2021   nicotine 14 mg/24hr patch Commonly known as: NICODERM CQ - dosed in mg/24 hours Place 1 patch (14 mg total) onto the skin daily. Start taking on: October 02, 2021   triamcinolone ointment 0.5 % Commonly known as: KENALOG Apply 1 application topically 3 (three) times daily.   Vitamin D3 50 MCG (2000 UT) capsule Take 2 capsules (4,000 Units total) by mouth daily. What changed: how much to take               Durable Medical Equipment  (From admission, onward)           Start     Ordered   10/01/21 1012  For home use only DME Nebulizer machine  Once       Question Answer Comment  Patient needs a nebulizer to treat with the following condition COPD (chronic obstructive pulmonary disease) ([place])   Length of Need 6 Months      10/01/21 1011   09/30/21 1027  For home use only DME oxygen  Once       Question Answer Comment  Length of Need 6 Months   Mode or (Route) Nasal cannula   Liters per Minute 3   Frequency Continuous (stationary and portable oxygen unit needed)   Oxygen delivery system Gas      09/30/21 1026            Follow-up Information     Hicks, Travis Lacks, MD Follow up in 1 week(s).   Specialty: Internal Medicine Contact information: [place] [address] [phone]         Hicks, Reisig, DO Follow up in 4 week(s).   Specialty: Pulmonary Disease Why: office will call you with appointment. Contact information: [address] [place] [address] [phone]                 Discharge Exam: Danley Danker Weights   09/26/21 1543 09/27/21 0300  Weight: 65.8 kg 68.7 kg  General; NAD Lung CTA Condition at discharge: stable  The results of significant diagnostics from this hospitalization (including imaging, microbiology, ancillary and laboratory) are listed below for reference.   Imaging Studies: CT ABDOMEN PELVIS WO CONTRAST  Result Date: 09/28/2021 CLINICAL DATA:  Bowel obstruction suspected EXAM: CT ABDOMEN AND PELVIS WITHOUT CONTRAST TECHNIQUE: Multidetector CT imaging of the abdomen and pelvis was performed following the standard protocol without IV contrast. RADIATION DOSE REDUCTION: This exam was performed according to the departmental dose-optimization program which includes automated exposure control, adjustment of the mA and/or kV according to patient size and/or use of iterative reconstruction technique.  COMPARISON:  09/26/2021 FINDINGS: Lower chest: Dependent bibasilar opacities, likely atelectasis. No effusions. Hepatobiliary: No focal hepatic abnormality. Gallbladder unremarkable. Pancreas: No focal abnormality or ductal dilatation. Spleen: No focal abnormality.  Normal size. Adrenals/Urinary Tract: 14 mm low-density lesion in the midpole of the left kidney compatible with cyst, stable. No follow-up imaging recommended. No stones or hydronephrosis. Adrenal glands and urinary bladder unremarkable. Stomach/Bowel: Normal appendix. Stomach, large and small bowel grossly unremarkable. Vascular/Lymphatic: Aortic atherosclerosis. No evidence of aneurysm or adenopathy. Reproductive: No visible focal abnormality. Other: No free fluid or free air. Musculoskeletal: No acute bony abnormality. IMPRESSION: Dependent bibasilar opacities, likely atelectasis. Aortic atherosclerosis. No acute findings in the abdomen or pelvis. Electronically Signed   By: Rolm Baptise M.D.   On: 09/28/2021 22:30   DG Abd 1 View  Result Date: 09/28/2021 CLINICAL DATA:  Provided history: Abdominal  distension. EXAM: ABDOMEN - 1 VIEW COMPARISON:  CT abdomen/pelvis 09/26/2021. FINDINGS: Nondescript loop of air distended bowel within the right hemiabdomen, measuring 4.9 cm in diameter. Elsewhere, no dilated loops of bowel are appreciated. No acute bony abnormality is identified. Lower lumbar spondylosis. IMPRESSION: Nondescript loop of air distended bowel within the right hemiabdomen, measuring 4.9 cm in diameter. This finding is nonspecific. If there is concern for developing small bowel obstruction, consider short-interval radiographic follow-up or a CT abdomen/pelvis for further evaluation. Electronically Signed   By: Kellie Simmering D.O.   On: 09/28/2021 13:07   CT Angio Chest PE W and/or Wo Contrast  Result Date: 09/26/2021 CLINICAL DATA:  Pulmonary embolism (PE) suspected, high prob; Abdominal pain, acute, nonlocalized Abd pain Pt via POV sent from Dr office due to low O2 saturation in office at appointment today, being seen for COPD eval. Pt arrives visibly SOB with O2 sat 70% on room air, increased to 80% on 3L nasal cannula. EXAM: CT ANGIOGRAPHY CHEST CT ABDOMEN AND PELVIS WITH CONTRAST TECHNIQUE: Multidetector CT imaging of the chest was performed using the standard protocol during bolus administration of intravenous contrast. Multiplanar CT image reconstructions and MIPs were obtained to evaluate the vascular anatomy. Multidetector CT imaging of the abdomen and pelvis was performed using the standard protocol during bolus administration of intravenous contrast. RADIATION DOSE REDUCTION: This exam was performed according to the departmental dose-optimization program which includes automated exposure control, adjustment of the mA and/or kV according to patient size and/or use of iterative reconstruction technique. CONTRAST:  143m OMNIPAQUE IOHEXOL 350 MG/ML SOLN COMPARISON:  None Available. FINDINGS: CTA CHEST FINDINGS Cardiovascular: Satisfactory opacification of the pulmonary arteries to the segmental  level. No central or segmental evidence of pulmonary embolism. Limited evaluation of the subsegmental level due to timing of contrast. The main pulmonary artery measures at the upper limits of normal. Normal heart size. No significant pericardial effusion. The thoracic aorta is normal in caliber. Mild atherosclerotic plaque of the thoracic aorta. No coronary artery calcifications. Mediastinum/Nodes: Enlarged right hilar lymph node measuring up to 1.7 cm. Prominent left hilar lymph nodes. Calcifications along the right left hilar lymph nodes may be sequelae of prior granulomatous disease. No enlarged mediastinal or axillary lymph nodes. Thyroid gland, trachea, and esophagus demonstrate no significant findings. Lungs/Pleura: Emphysematous changes. Diffuse bronchial wall thickening. Peribronchovascular tree-in-bud nodularity most prominent in the upper lung zones. Linear atelectasis versus scarring within the right middle lobe. No pulmonary nodule. No pulmonary mass. No pleural effusion. No pneumothorax. Musculoskeletal: No chest wall abnormality. No suspicious lytic or blastic osseous lesions. No acute displaced fracture. Multilevel degenerative changes of the spine.  Review of the MIP images confirms the above findings. CT ABDOMEN and PELVIS FINDINGS Hepatobiliary: No focal liver abnormality. The gallbladder is contracted. No gallstones, gallbladder wall thickening, or pericholecystic fluid. No biliary dilatation. Pancreas: No focal lesion. Normal pancreatic contour. No surrounding inflammatory changes. No main pancreatic ductal dilatation. Spleen: Normal in size without focal abnormality. Adrenals/Urinary Tract: No adrenal nodule bilaterally. Bilateral kidneys enhance symmetrically. A left renal 1.4 cm fluid density lesion likely represents a simple renal cyst. Simple renal cysts, in the absence of clinically indicated signs/symptoms, require no independent follow-up. No hydronephrosis. No hydroureter. The urinary  bladder is unremarkable. Stomach/Bowel: Stomach is within normal limits. No evidence of bowel wall thickening or dilatation. Scattered colonic diverticulosis appendix appears normal. Vascular/Lymphatic: No abdominal aorta or iliac aneurysm. Severe atherosclerotic plaque of the aorta and its branches. No abdominal, pelvic, or inguinal lymphadenopathy. Reproductive: Prostate is unremarkable. Other: No intraperitoneal free fluid. No intraperitoneal free gas. No organized fluid collection. Musculoskeletal: No abdominal wall hernia or abnormality. 3.7 cm fat density lesion within the right gluteal musculature likely represents lipomatous lesion. No suspicious lytic or blastic osseous lesions. No acute displaced fracture. Grade 1 anterolisthesis of L4 on L5. Review of the MIP images confirms the above findings. IMPRESSION: 1. No central or segmental pulmonary embolus. Limited evaluation of the subsegmental level due to timing of contrast. 2. Pulmonary findings suggestive of an atypical infection. No follow-up needed if patient is low-risk (and has no known or suspected primary neoplasm). Non-contrast chest CT can be considered in 12 months if patient is high-risk. This recommendation follows the consensus statement: Guidelines for Management of Incidental Pulmonary Nodules Detected on CT Images: From the Fleischner Society 2017; Radiology 2017; 284:228-243. 3. Right middle lobe linear atelectasis versus scarring. Developing infection not excluded. 4. Right hilar lymphadenopathy may be reactive in etiology. Recommend attention on follow-up. 5. Colonic diverticulosis with no acute diverticulitis. 6.  Aortic Atherosclerosis (ICD10-I70.0). Electronically Signed   By: Iven Finn M.D.   On: 09/26/2021 19:25   CT ABDOMEN PELVIS W CONTRAST  Result Date: 09/26/2021 CLINICAL DATA:  Pulmonary embolism (PE) suspected, high prob; Abdominal pain, acute, nonlocalized Abd pain Pt via POV sent from Dr office due to low O2  saturation in office at appointment today, being seen for COPD eval. Pt arrives visibly SOB with O2 sat 70% on room air, increased to 80% on 3L nasal cannula. EXAM: CT ANGIOGRAPHY CHEST CT ABDOMEN AND PELVIS WITH CONTRAST TECHNIQUE: Multidetector CT imaging of the chest was performed using the standard protocol during bolus administration of intravenous contrast. Multiplanar CT image reconstructions and MIPs were obtained to evaluate the vascular anatomy. Multidetector CT imaging of the abdomen and pelvis was performed using the standard protocol during bolus administration of intravenous contrast. RADIATION DOSE REDUCTION: This exam was performed according to the departmental dose-optimization program which includes automated exposure control, adjustment of the mA and/or kV according to patient size and/or use of iterative reconstruction technique. CONTRAST:  175m OMNIPAQUE IOHEXOL 350 MG/ML SOLN COMPARISON:  None Available. FINDINGS: CTA CHEST FINDINGS Cardiovascular: Satisfactory opacification of the pulmonary arteries to the segmental level. No central or segmental evidence of pulmonary embolism. Limited evaluation of the subsegmental level due to timing of contrast. The main pulmonary artery measures at the upper limits of normal. Normal heart size. No significant pericardial effusion. The thoracic aorta is normal in caliber. Mild atherosclerotic plaque of the thoracic aorta. No coronary artery calcifications. Mediastinum/Nodes: Enlarged right hilar lymph node measuring up to 1.7 cm. Prominent  left hilar lymph nodes. Calcifications along the right left hilar lymph nodes may be sequelae of prior granulomatous disease. No enlarged mediastinal or axillary lymph nodes. Thyroid gland, trachea, and esophagus demonstrate no significant findings. Lungs/Pleura: Emphysematous changes. Diffuse bronchial wall thickening. Peribronchovascular tree-in-bud nodularity most prominent in the upper lung zones. Linear atelectasis  versus scarring within the right middle lobe. No pulmonary nodule. No pulmonary mass. No pleural effusion. No pneumothorax. Musculoskeletal: No chest wall abnormality. No suspicious lytic or blastic osseous lesions. No acute displaced fracture. Multilevel degenerative changes of the spine. Review of the MIP images confirms the above findings. CT ABDOMEN and PELVIS FINDINGS Hepatobiliary: No focal liver abnormality. The gallbladder is contracted. No gallstones, gallbladder wall thickening, or pericholecystic fluid. No biliary dilatation. Pancreas: No focal lesion. Normal pancreatic contour. No surrounding inflammatory changes. No main pancreatic ductal dilatation. Spleen: Normal in size without focal abnormality. Adrenals/Urinary Tract: No adrenal nodule bilaterally. Bilateral kidneys enhance symmetrically. A left renal 1.4 cm fluid density lesion likely represents a simple renal cyst. Simple renal cysts, in the absence of clinically indicated signs/symptoms, require no independent follow-up. No hydronephrosis. No hydroureter. The urinary bladder is unremarkable. Stomach/Bowel: Stomach is within normal limits. No evidence of bowel wall thickening or dilatation. Scattered colonic diverticulosis appendix appears normal. Vascular/Lymphatic: No abdominal aorta or iliac aneurysm. Severe atherosclerotic plaque of the aorta and its branches. No abdominal, pelvic, or inguinal lymphadenopathy. Reproductive: Prostate is unremarkable. Other: No intraperitoneal free fluid. No intraperitoneal free gas. No organized fluid collection. Musculoskeletal: No abdominal wall hernia or abnormality. 3.7 cm fat density lesion within the right gluteal musculature likely represents lipomatous lesion. No suspicious lytic or blastic osseous lesions. No acute displaced fracture. Grade 1 anterolisthesis of L4 on L5. Review of the MIP images confirms the above findings. IMPRESSION: 1. No central or segmental pulmonary embolus. Limited evaluation  of the subsegmental level due to timing of contrast. 2. Pulmonary findings suggestive of an atypical infection. No follow-up needed if patient is low-risk (and has no known or suspected primary neoplasm). Non-contrast chest CT can be considered in 12 months if patient is high-risk. This recommendation follows the consensus statement: Guidelines for Management of Incidental Pulmonary Nodules Detected on CT Images: From the Fleischner Society 2017; Radiology 2017; 284:228-243. 3. Right middle lobe linear atelectasis versus scarring. Developing infection not excluded. 4. Right hilar lymphadenopathy may be reactive in etiology. Recommend attention on follow-up. 5. Colonic diverticulosis with no acute diverticulitis. 6.  Aortic Atherosclerosis (ICD10-I70.0). Electronically Signed   By: Iven Finn M.D.   On: 09/26/2021 19:25   DG Chest Portable 1 View  Result Date: 09/26/2021 CLINICAL DATA:  Shortness of breath. EXAM: PORTABLE CHEST 1 VIEW COMPARISON:  Chest x-ray dated January 28, 2021. FINDINGS: The heart size and mediastinal contours are within normal limits. Normal pulmonary vascularity. Hazy airspace density in the right upper lobe. No pleural effusion or pneumothorax. No acute osseous abnormality. IMPRESSION: 1. Right upper lobe pneumonia. Electronically Signed   By: Titus Dubin M.D.   On: 09/26/2021 16:04    Microbiology: Results for orders placed or performed during the hospital encounter of 09/26/21  Resp Panel by RT-PCR (Flu A&B, Covid) Anterior Nasal Swab     Status: None   Collection Time: 09/26/21  3:50 PM   Specimen: Anterior Nasal Swab  Result Value Ref Range Status   SARS Coronavirus 2 by RT PCR NEGATIVE NEGATIVE Final    Comment: (NOTE) SARS-CoV-2 target nucleic acids are NOT DETECTED.  The SARS-CoV-2 RNA is  generally detectable in upper respiratory specimens during the acute phase of infection. The lowest concentration of SARS-CoV-2 viral copies this assay can detect is 138  copies/mL. A negative result does not preclude SARS-Cov-2 infection and should not be used as the sole basis for treatment or other patient management decisions. A negative result may occur with  improper specimen collection/handling, submission of specimen other than nasopharyngeal swab, presence of viral mutation(s) within the areas targeted by this assay, and inadequate number of viral copies(<138 copies/mL). A negative result must be combined with clinical observations, patient history, and epidemiological information. The expected result is Negative.  Fact Sheet for Patients:  EntrepreneurPulse.com.au  Fact Sheet for Healthcare Providers:  IncredibleEmployment.be  This test is no t yet approved or cleared by the [place] FDA and  has been authorized for detection and/or diagnosis of SARS-CoV-2 by FDA under an Emergency Use Authorization (EUA). This EUA will remain  in effect (meaning this test can be used) for the duration of the COVID-19 declaration under Section 564(b)(1) of the Act, 21 U.S.C.section 360bbb-3(b)(1), unless the authorization is terminated  or revoked sooner.       Influenza A by PCR NEGATIVE NEGATIVE Final   Influenza B by PCR NEGATIVE NEGATIVE Final    Comment: (NOTE) The Xpert Xpress SARS-CoV-2/FLU/RSV plus assay is intended as an aid in the diagnosis of influenza from Nasopharyngeal swab specimens and should not be used as a sole basis for treatment. Nasal washings and aspirates are unacceptable for Xpert Xpress SARS-CoV-2/FLU/RSV testing.  Fact Sheet for Patients: EntrepreneurPulse.com.au  Fact Sheet for Healthcare Providers: IncredibleEmployment.be  This test is not yet approved or cleared by the [place] FDA and has been authorized for detection and/or diagnosis of SARS-CoV-2 by FDA under an Emergency Use Authorization (EUA). This EUA will remain in effect (meaning  this test can be used) for the duration of the COVID-19 declaration under Section 564(b)(1) of the Act, 21 U.S.C. section 360bbb-3(b)(1), unless the authorization is terminated or revoked.  Performed at [facility], [place] [address], [place], [place] [address]   Culture, blood (routine x 2)     Status: None   Collection Time: 09/26/21  4:06 PM   Specimen: BLOOD  Result Value Ref Range Status   Specimen Description   Final    BLOOD BLOOD LEFT FOREARM Performed at [facility] [address], [place], [place] [address]    Special Requests   Final    BOTTLES DRAWN AEROBIC AND ANAEROBIC Blood Culture adequate volume Performed at [facility] [address], [place], [place] [address]    Culture   Final    NO GROWTH 5 DAYS Performed at [facility], [place] [address], [place], [place] [address]    Report Status 10/01/2021 FINAL  Final  Culture, blood (routine x 2)     Status: None   Collection Time: 09/26/21  4:13 PM   Specimen: BLOOD  Result Value Ref Range Status   Specimen Description   Final    BLOOD BLOOD RIGHT FOREARM Performed at [facility] [address] [place], [place] [address]    Special Requests   Final    BOTTLES DRAWN AEROBIC AND ANAEROBIC Blood Culture adequate volume Performed at [facility] [address] [place], [place] [address]    Culture   Final    NO GROWTH 5 DAYS Performed at [facility], [place] [address], [place], [place]  [address]    Report Status 10/01/2021 FINAL  Final  Urine Culture     Status: None   Collection Time: 09/26/21  8:08 PM   Specimen: Urine, Clean Catch  Result Value Ref Range Status   Specimen Description   Final    URINE, CLEAN CATCH Performed at [facility], [place] [address], [place], [place] [address]    Special Requests   Final    NONE Performed at [facility], [place] [address], [place], [place] [address]    Culture   Final    NO GROWTH Performed at [facility], [place] [address] [place], [place] [address]    Report Status 09/28/2021 FINAL  Final  Respiratory (~20 pathogens) panel by PCR     Status: None   Collection Time: 09/26/21  9:50 PM   Specimen: Nasopharyngeal Swab; Respiratory  Result Value Ref Range Status   Adenovirus NOT DETECTED NOT DETECTED Final   Coronavirus 229E NOT DETECTED NOT DETECTED Final    Comment: (NOTE) The Coronavirus on the Respiratory Panel, DOES NOT test for the novel  Coronavirus (2019 nCoV)    Coronavirus HKU1 NOT DETECTED NOT DETECTED Final   Coronavirus NL63 NOT DETECTED NOT DETECTED Final   Coronavirus OC43 NOT DETECTED NOT DETECTED Final   Metapneumovirus NOT DETECTED NOT DETECTED Final   Rhinovirus / Enterovirus NOT DETECTED NOT DETECTED Final   Influenza A NOT DETECTED NOT DETECTED Final   Influenza B NOT DETECTED NOT DETECTED Final   Parainfluenza Virus 1 NOT DETECTED NOT DETECTED Final   Parainfluenza Virus 2 NOT DETECTED NOT DETECTED Final   Parainfluenza Virus 3 NOT DETECTED NOT DETECTED Final   Parainfluenza Virus 4 NOT DETECTED NOT DETECTED Final   Respiratory Syncytial Virus NOT DETECTED NOT DETECTED Final   Bordetella pertussis NOT DETECTED NOT DETECTED Final   Bordetella Parapertussis NOT DETECTED NOT DETECTED Final   Chlamydophila pneumoniae NOT DETECTED NOT DETECTED Final   Mycoplasma pneumoniae NOT DETECTED NOT DETECTED Final    Comment: Performed at [facility], [place] [address] [place], [place] [address]  Respiratory (~20 pathogens) panel by PCR     Status: None   Collection Time: 09/27/21  2:56 AM   Specimen: Nasopharyngeal Swab; Respiratory  Result Value Ref Range Status   Adenovirus NOT DETECTED NOT DETECTED Final   Coronavirus 229E NOT DETECTED NOT DETECTED Final    Comment: (NOTE) The Coronavirus on the Respiratory Panel, DOES NOT test for the novel  Coronavirus (2019 nCoV)     Coronavirus HKU1 NOT DETECTED NOT DETECTED Final   Coronavirus NL63 NOT DETECTED NOT DETECTED Final   Coronavirus OC43 NOT DETECTED NOT DETECTED Final   Metapneumovirus NOT DETECTED NOT DETECTED Final   Rhinovirus / Enterovirus NOT DETECTED NOT DETECTED Final   Influenza A NOT DETECTED NOT DETECTED Final   Influenza B NOT DETECTED NOT DETECTED Final   Parainfluenza Virus 1 NOT DETECTED NOT DETECTED Final   Parainfluenza Virus 2 NOT DETECTED NOT DETECTED Final   Parainfluenza Virus 3 NOT DETECTED NOT DETECTED Final   Parainfluenza Virus 4 NOT DETECTED NOT DETECTED Final   Respiratory Syncytial Virus NOT DETECTED NOT DETECTED Final   Bordetella pertussis NOT DETECTED NOT DETECTED Final   Bordetella Parapertussis NOT DETECTED NOT DETECTED Final   Chlamydophila pneumoniae NOT DETECTED NOT DETECTED Final   Mycoplasma pneumoniae NOT DETECTED NOT DETECTED Final    Comment: Performed at [facility], [place] [place],  [place] [address]  MRSA Next Gen by PCR, Nasal     Status: None   Collection Time: 09/27/21  2:56 AM   Specimen: Nasal Mucosa; Nasal Swab  Result Value Ref Range Status   MRSA by PCR Next Gen NOT DETECTED NOT DETECTED Final    Comment: (NOTE) The GeneXpert MRSA Assay (FDA approved for NASAL specimens only), is one component of a comprehensive MRSA colonization surveillance program. It is not intended to diagnose MRSA infection nor to guide or monitor treatment for MRSA infections. Test performance is not FDA approved in patients less than 42 years old. Performed at [facility], [place] [address] [place], [place] [address]   Culture, blood (routine x 2) Call MD if unable to obtain prior to antibiotics being given     Status: None (Preliminary result)   Collection Time: 09/27/21  6:08 AM   Specimen: BLOOD LEFT ARM  Result Value Ref Range Status   Specimen Description   Final    BLOOD LEFT ARM Performed at [facility]  [address], [place], [place] [address]    Special Requests   Final    BOTTLES DRAWN AEROBIC AND ANAEROBIC Blood Culture adequate volume Performed at [facility] [address] [place], [place] [address]    Culture   Final    NO GROWTH 4 DAYS Performed at [facility], [place] [address], [place], [place] [address]    Report Status PENDING  Incomplete  Culture, blood (routine x 2) Call MD if unable to obtain prior to antibiotics being given     Status: None (Preliminary result)   Collection Time: 09/27/21  6:17 AM   Specimen: BLOOD LEFT WRIST  Result Value Ref Range Status   Specimen Description   Final    BLOOD LEFT WRIST Performed at [facility] [address], [place], [place] [address]    Special Requests   Final    BOTTLES DRAWN AEROBIC AND ANAEROBIC Blood Culture adequate volume Performed at [facility] [address] [place], [place] [address]    Culture   Final    NO GROWTH 4 DAYS Performed at [facility], [place] [address] [place], [place] [address]    Report Status PENDING  Incomplete  Expectorated Sputum Assessment w Gram Stain, Rflx to Resp Cult     Status: None   Collection Time: 09/27/21 12:24 PM   Specimen: Expectorated Sputum  Result Value Ref Range Status   Specimen Description EXPECTORATED SPUTUM  Final   Special Requests NONE  Final   Sputum evaluation   Final    THIS SPECIMEN IS ACCEPTABLE FOR SPUTUM CULTURE Performed at [facility], [place] [address] [place], [place] [address]    Report Status 09/27/2021 FINAL  Final  Culture, Respiratory w Gram Stain     Status: None   Collection Time: 09/27/21 12:24 PM  Result Value Ref Range Status   Specimen Description   Final    EXPECTORATED SPUTUM Performed at [facility] [address] [place],  [address]    Special Requests   Final    NONE Reflexed from [id number] Performed at [facility], [place] [address], [place], [place] [address]    Gram Stain   Final    MODERATE SQUAMOUS EPITHELIAL CELLS PRESENT MODERATE WBC PRESENT,BOTH PMN AND MONONUCLEAR FEW GRAM VARIABLE ROD FEW GRAM NEGATIVE RODS FEW GRAM POSITIVE COCCI IN PAIRS  AND CHAINS    Culture   Final    FEW Normal respiratory flora-no Staph aureus or Pseudomonas seen Performed at [facility], [place] [address], [place], [place] [address]    Report Status 09/29/2021 FINAL  Final    Labs: CBC: Recent Labs  Lab 09/26/21 1549 09/27/21 0608 09/28/21 0249 09/29/21 1013 09/30/21 0514  WBC 10.9* 9.9 12.5* 12.2* 10.8*  NEUTROABS 9.2* 9.0*  --   --   --   HGB 14.2 13.4 11.6* 12.7* 11.9*  HCT 41.9 41.1 36.2* 38.8* 35.8*  MCV 100.5* 103.0* 103.4* 100.5* 100.0  PLT 264 219 251 324 712   Basic Metabolic Panel: Recent Labs  Lab 09/26/21 1549 09/27/21 0608 09/28/21 0249 09/29/21 1013 09/30/21 0514 10/01/21 0842  NA 140 140 140 142 143 141  K 2.9* 3.7 3.5 2.7* 3.0* 3.7  CL 96* 100 99 95* 100 96*  CO2 '29 31 31 '$ 37* 37* 38*  GLUCOSE 199* 189* 158* 152* 95 134*  BUN '16 12 18 14 11 8  '$ CREATININE 1.38* 0.97 0.93 0.81 0.89 0.78  CALCIUM 8.9 8.3* 8.2* 8.8* 8.6* 8.9  MG 1.9  --   --   --  1.9  --   PHOS 4.7*  --   --   --   --   --    Liver Function Tests: Recent Labs  Lab 09/26/21 1549 09/27/21 0608  AST 47*  46* 38  ALT '22  23 21  '$ ALKPHOS 93  96 81  BILITOT 1.6*  1.6* 0.7  PROT 7.8  7.7 7.0  ALBUMIN 3.1*  3.2* 2.9*   CBG: No results for input(s): "GLUCAP" in the last 168 hours.  Discharge time spent: greater than 30 minutes.  Signed: Elmarie Shiley, MD [facility] 10/01/2021

## 2021-10-02 LAB — CULTURE, BLOOD (ROUTINE X 2)
Culture: NO GROWTH
Culture: NO GROWTH
Special Requests: ADEQUATE
Special Requests: ADEQUATE

## 2021-10-02 NOTE — Progress Notes (Signed)
The patients 's daughter called the writer at shift change regarding her father's oxygen tank, once they got home and was discharged. Upon arriving home, they faced difficulties and had doubts about how to use the oxygen tank.. I suggested that the patient's daughter Baxter Flattery, call 911 for assistance and stayed on the phone with her until they arrived. The day shift charge nurse Abby attempted to reach out to [place] she was able to connect with someone They assured her that they would follow up with the patient at this time.

## 2021-10-03 ENCOUNTER — Telehealth: Payer: Self-pay

## 2021-10-03 NOTE — Telephone Encounter (Signed)
TCM not needed - °

## 2021-10-10 ENCOUNTER — Encounter: Payer: Self-pay | Admitting: Internal Medicine

## 2021-10-10 ENCOUNTER — Ambulatory Visit: Payer: Federal, State, Local not specified - PPO | Admitting: Internal Medicine

## 2021-10-10 VITALS — BP 128/82 | HR 91 | Temp 98.3°F | Ht 72.0 in | Wt 148.8 lb

## 2021-10-10 DIAGNOSIS — E43 Unspecified severe protein-calorie malnutrition: Secondary | ICD-10-CM | POA: Diagnosis not present

## 2021-10-10 DIAGNOSIS — R634 Abnormal weight loss: Secondary | ICD-10-CM

## 2021-10-10 DIAGNOSIS — F1091 Alcohol use, unspecified, in remission: Secondary | ICD-10-CM

## 2021-10-10 DIAGNOSIS — J189 Pneumonia, unspecified organism: Secondary | ICD-10-CM | POA: Diagnosis not present

## 2021-10-10 DIAGNOSIS — J441 Chronic obstructive pulmonary disease with (acute) exacerbation: Secondary | ICD-10-CM | POA: Diagnosis not present

## 2021-10-10 NOTE — Assessment & Plan Note (Signed)
Doing well now 

## 2021-10-10 NOTE — Assessment & Plan Note (Addendum)
On O2 Needs to stop smoking F/u w/Dr Erin Fulling (Pul[facility])

## 2021-10-10 NOTE — Assessment & Plan Note (Signed)
On O2 Needs to stop smoking F/u w/Dr Erin Fulling (Pul[facility])

## 2021-10-10 NOTE — Assessment & Plan Note (Signed)
Drink Boost or Ensure

## 2021-10-10 NOTE — Progress Notes (Signed)
Subjective:  Patient ID: Travis Hicks, male    DOB: Mar 04, 1950  Age: 71 y.o. MRN: [id number]  CC: Follow-up (Hosp follow-up)   HPI Travis Hicks presents for CAP, COPD, hypoxemia f/u. Smoking 1/2 ppd now  Per hx: " Admit date:     09/26/2021  Discharge date: 10/01/21  Discharge Physician: Elmarie Shiley    PCP: Cassandria Anger, MD    Recommendations at discharge:     PCP and pulmonologist for resolution of PNA>  Consider chantix out patient for smoking cessation.  Needs CT chest follow up on Mediastinal lymphadenopathy.    Discharge Diagnoses: Principal Problem:   CAP (community acquired pneumonia) Active Problems:   Tobacco abuse   Hematuria   Glaucoma   Malnutrition ([place])   COPD exacerbation (HCC)   Hypokalemia   Dementia without behavioral disturbance (HCC)   Acute respiratory failure with hypoxia (HCC)   Sepsis (HCC)   History of ETOH abuse   AKI (acute kidney injury) ([place])   Protein-calorie malnutrition, severe   Resolved Problems:   * No resolved hospital problems. *   Hospital Course: 71 year old with past medical history significant for COPD, tobacco abuse, alcohol abuse, glaucoma presents with shortness of breath and hypoxia from pulmonology office.  He was found to be hypoxic with oxygen saturation 70% on room air.  He was placed on 3 L of oxygen and transferred to the ED.  Chest x-ray positive for right upper lobe pneumonia.  CTA chest negative for PE.  Findings suggestive of atypical infection, mediastinal adenopathy.   [name]  He has been receiving IV antibiotics, IVF steroids subsequently transition to oral prednisone.     Assessment and Plan:   1-Acute Hypoxic respiratory failure: COPD exacerbation and  community-acquired pneumonia: Respiratory panel negative.  Strept Pneumonia negative. Legionella antigen negative.  Follow  sputum culture.  Few normal respiratory flora, no a staph or Pseudomonas. Treated IV Unasyn  and azithromycin. For 5 days in the hsopital. He will be discharge on Augmentin for 2 more days.  Speech swallow for aspiration. ;  Recommended thin liquid regular diet. Continue with LABA LAMA ICS.  Discharge on Arnuity and Anoro.  Completed prednisone.  Improved. Home today.    Hypokalemia: Replaced.    AKI: Received IV fluids.  Resolved   Dementia: Continue with Aricept   Mediastinal lymphadenopathy: CT follow-up   Constipation;  Report feeling abdominal fullness. No passing gas.  KUB: Nondescript loop of air distended bowel within the right CT abdomen pelvis: Negative He had a bowel movement.   Severe malnutrition in context of chronic illness.  Started on supplement.    Consultants: Pulmonologist "  Outpatient Medications Prior to Visit  Medication Sig Dispense Refill   albuterol (PROVENTIL) (2.5 MG/3ML) 0.083% nebulizer solution Take 3 mLs (2.5 mg total) by nebulization every 6 (six) hours as needed for wheezing. 75 mL 12   b complex vitamins tablet Take 1 tablet by mouth daily. 100 tablet 3   Cholecalciferol (VITAMIN D3) 50 MCG (2000 UT) capsule Take 2 capsules (4,000 Units total) by mouth daily. (Patient taking differently: Take 2,000 Units by mouth daily.) 100 capsule 3   donepezil (ARICEPT) 5 MG tablet Take 1 tablet (5 mg total) by mouth at bedtime. 90 tablet 3   Fluticasone Furoate (ARNUITY ELLIPTA) 50 MCG/ACT AEPB Inhale 1 Inhalation into the lungs daily. 60 each 0   guaiFENesin (MUCINEX) 600 MG 12 hr  tablet Take 1 tablet (600 mg total) by mouth 2 (two) times daily. 60 tablet 1   latanoprost (XALATAN) 0.005 % ophthalmic solution Place 1 drop into the left eye at bedtime.     Multiple Vitamin (MULTIVITAMIN WITH MINERALS) TABS tablet Take 1 tablet by mouth daily. 30 tablet 0   nicotine (NICODERM CQ - DOSED IN MG/24 HOURS) 14 mg/24hr patch Place 1 patch (14 mg total) onto the  skin daily. 28 patch 0   triamcinolone ointment (KENALOG) 0.5 % Apply 1 application topically 3 (three) times daily. 120 g 1   umeclidinium-vilanterol (ANORO ELLIPTA) 62.5-25 MCG/ACT AEPB Inhale 1 puff into the lungs daily. 1 each 11   No facility-administered medications prior to visit.    ROS: Review of Systems  Constitutional:  Negative for appetite change, fatigue and unexpected weight change.  HENT:  Positive for congestion. Negative for nosebleeds, sneezing, sore throat and trouble swallowing.   Eyes:  Negative for itching and visual disturbance.  Respiratory:  Positive for cough and shortness of breath.   Cardiovascular:  Negative for chest pain, palpitations and leg swelling.  Gastrointestinal:  Negative for abdominal distention, blood in stool, diarrhea and nausea.  Genitourinary:  Negative for frequency and hematuria.  Musculoskeletal:  Negative for back pain, gait problem, joint swelling and neck pain.  Skin:  Negative for rash.  Neurological:  Negative for dizziness, tremors, speech difficulty and weakness.  Psychiatric/Behavioral:  Negative for agitation, dysphoric mood and sleep disturbance. The patient is not nervous/anxious.     Objective:  BP 128/82 (BP Location: Left Arm)   Pulse 91   Temp 98.3 F (36.8 C) (Oral)   Ht 6' (1.829 m)   Wt 148 lb 12.8 oz (67.5 kg)   SpO2 97% Comment: 3 ML  BMI 20.18 kg/m   BP Readings from Last 3 Encounters:  10/10/21 128/82  10/01/21 (!) 142/97  09/26/21 138/80    Wt Readings from Last 3 Encounters:  10/10/21 148 lb 12.8 oz (67.5 kg)  09/27/21 151 lb 7.3 oz (68.7 kg)  09/26/21 147 lb 3.2 oz (66.8 kg)    Physical Exam Constitutional:      General: He is not in acute distress.    Appearance: He is well-developed.     Comments: NAD  Eyes:     Conjunctiva/sclera: Conjunctivae normal.     Pupils: Pupils are equal, round, and reactive to light.  Neck:     Thyroid: No thyromegaly.     Vascular: No JVD.  Cardiovascular:      Rate and Rhythm: Normal rate and regular rhythm.     Heart sounds: Normal heart sounds. No murmur heard.    No friction rub. No gallop.  Pulmonary:     Effort: Pulmonary effort is normal. No respiratory distress.     Breath sounds: Normal breath sounds. No wheezing or rales.  Chest:     Chest wall: No tenderness.  Abdominal:     General: Bowel sounds are normal. There is no distension.     Palpations: Abdomen is soft. There is no mass.     Tenderness: There is no abdominal tenderness. There is no guarding or rebound.  Musculoskeletal:        General: No tenderness. Normal range of motion.     Cervical back: Normal range of motion.  Lymphadenopathy:     Cervical: No cervical adenopathy.  Skin:    General: Skin is warm and dry.     Findings: No rash.  Neurological:  Mental Status: He is alert and oriented to person, place, and time.     Cranial Nerves: No cranial nerve deficit.     Motor: No abnormal muscle tone.     Coordination: Coordination normal.     Gait: Gait normal.     Deep Tendon Reflexes: Reflexes are normal and symmetric.  Psychiatric:        Behavior: Behavior normal.        Thought Content: Thought content normal.        Judgment: Judgment normal.     Lab Results  Component Value Date   WBC 10.8 (H) 09/30/2021   HGB 11.9 (L) 09/30/2021   HCT 35.8 (L) 09/30/2021   PLT 338 09/30/2021   GLUCOSE 134 (H) 10/01/2021   CHOL 140 03/05/2020   TRIG 166 (H) 03/05/2020   HDL 73 03/05/2020   LDLCALC 43 03/05/2020   ALT 21 09/27/2021   AST 38 09/27/2021   NA 141 10/01/2021   K 3.7 10/01/2021   CL 96 (L) 10/01/2021   CREATININE 0.78 10/01/2021   BUN 8 10/01/2021   CO2 38 (H) 10/01/2021   TSH 0.225 (L) 09/27/2021   PSA 0.37 03/05/2020   INR 0.9 09/26/2021    CT Angio Chest PE W and/or Wo Contrast  Result Date: 09/26/2021 CLINICAL DATA:  Pulmonary embolism (PE) suspected, high prob; Abdominal pain, acute, nonlocalized Abd pain Pt via POV sent from Dr  office due to low O2 saturation in office at appointment today, being seen for COPD eval. Pt arrives visibly SOB with O2 sat 70% on room air, increased to 80% on 3L nasal cannula. EXAM: CT ANGIOGRAPHY CHEST CT ABDOMEN AND PELVIS WITH CONTRAST TECHNIQUE: Multidetector CT imaging of the chest was performed using the standard protocol during bolus administration of intravenous contrast. Multiplanar CT image reconstructions and MIPs were obtained to evaluate the vascular anatomy. Multidetector CT imaging of the abdomen and pelvis was performed using the standard protocol during bolus administration of intravenous contrast. RADIATION DOSE REDUCTION: This exam was performed according to the departmental dose-optimization program which includes automated exposure control, adjustment of the mA and/or kV according to patient size and/or use of iterative reconstruction technique. CONTRAST:  120m OMNIPAQUE IOHEXOL 350 MG/ML SOLN COMPARISON:  None Available. FINDINGS: CTA CHEST FINDINGS Cardiovascular: Satisfactory opacification of the pulmonary arteries to the segmental level. No central or segmental evidence of pulmonary embolism. Limited evaluation of the subsegmental level due to timing of contrast. The main pulmonary artery measures at the upper limits of normal. Normal heart size. No significant pericardial effusion. The thoracic aorta is normal in caliber. Mild atherosclerotic plaque of the thoracic aorta. No coronary artery calcifications. Mediastinum/Nodes: Enlarged right hilar lymph node measuring up to 1.7 cm. Prominent left hilar lymph nodes. Calcifications along the right left hilar lymph nodes may be sequelae of prior granulomatous disease. No enlarged mediastinal or axillary lymph nodes. Thyroid gland, trachea, and esophagus demonstrate no significant findings. Lungs/Pleura: Emphysematous changes. Diffuse bronchial wall thickening. Peribronchovascular tree-in-bud nodularity most prominent in the upper lung  zones. Linear atelectasis versus scarring within the right middle lobe. No pulmonary nodule. No pulmonary mass. No pleural effusion. No pneumothorax. Musculoskeletal: No chest wall abnormality. No suspicious lytic or blastic osseous lesions. No acute displaced fracture. Multilevel degenerative changes of the spine. Review of the MIP images confirms the above findings. CT ABDOMEN and PELVIS FINDINGS Hepatobiliary: No focal liver abnormality. The gallbladder is contracted. No gallstones, gallbladder wall thickening, or pericholecystic fluid. No biliary dilatation. Pancreas:  No focal lesion. Normal pancreatic contour. No surrounding inflammatory changes. No main pancreatic ductal dilatation. Spleen: Normal in size without focal abnormality. Adrenals/Urinary Tract: No adrenal nodule bilaterally. Bilateral kidneys enhance symmetrically. A left renal 1.4 cm fluid density lesion likely represents a simple renal cyst. Simple renal cysts, in the absence of clinically indicated signs/symptoms, require no independent follow-up. No hydronephrosis. No hydroureter. The urinary bladder is unremarkable. Stomach/Bowel: Stomach is within normal limits. No evidence of bowel wall thickening or dilatation. Scattered colonic diverticulosis appendix appears normal. Vascular/Lymphatic: No abdominal aorta or iliac aneurysm. Severe atherosclerotic plaque of the aorta and its branches. No abdominal, pelvic, or inguinal lymphadenopathy. Reproductive: Prostate is unremarkable. Other: No intraperitoneal free fluid. No intraperitoneal free gas. No organized fluid collection. Musculoskeletal: No abdominal wall hernia or abnormality. 3.7 cm fat density lesion within the right gluteal musculature likely represents lipomatous lesion. No suspicious lytic or blastic osseous lesions. No acute displaced fracture. Grade 1 anterolisthesis of L4 on L5. Review of the MIP images confirms the above findings. IMPRESSION: 1. No central or segmental pulmonary  embolus. Limited evaluation of the subsegmental level due to timing of contrast. 2. Pulmonary findings suggestive of an atypical infection. No follow-up needed if patient is low-risk (and has no known or suspected primary neoplasm). Non-contrast chest CT can be considered in 12 months if patient is high-risk. This recommendation follows the consensus statement: Guidelines for Management of Incidental Pulmonary Nodules Detected on CT Images: From the Fleischner Society 2017; Radiology 2017; 284:228-243. 3. Right middle lobe linear atelectasis versus scarring. Developing infection not excluded. 4. Right hilar lymphadenopathy may be reactive in etiology. Recommend attention on follow-up. 5. Colonic diverticulosis with no acute diverticulitis. 6.  Aortic Atherosclerosis (ICD10-I70.0). Electronically Signed   By: Iven Finn M.D.   On: 09/26/2021 19:25   CT ABDOMEN PELVIS W CONTRAST  Result Date: 09/26/2021 CLINICAL DATA:  Pulmonary embolism (PE) suspected, high prob; Abdominal pain, acute, nonlocalized Abd pain Pt via POV sent from Dr office due to low O2 saturation in office at appointment today, being seen for COPD eval. Pt arrives visibly SOB with O2 sat 70% on room air, increased to 80% on 3L nasal cannula. EXAM: CT ANGIOGRAPHY CHEST CT ABDOMEN AND PELVIS WITH CONTRAST TECHNIQUE: Multidetector CT imaging of the chest was performed using the standard protocol during bolus administration of intravenous contrast. Multiplanar CT image reconstructions and MIPs were obtained to evaluate the vascular anatomy. Multidetector CT imaging of the abdomen and pelvis was performed using the standard protocol during bolus administration of intravenous contrast. RADIATION DOSE REDUCTION: This exam was performed according to the departmental dose-optimization program which includes automated exposure control, adjustment of the mA and/or kV according to patient size and/or use of iterative reconstruction technique. CONTRAST:   149m OMNIPAQUE IOHEXOL 350 MG/ML SOLN COMPARISON:  None Available. FINDINGS: CTA CHEST FINDINGS Cardiovascular: Satisfactory opacification of the pulmonary arteries to the segmental level. No central or segmental evidence of pulmonary embolism. Limited evaluation of the subsegmental level due to timing of contrast. The main pulmonary artery measures at the upper limits of normal. Normal heart size. No significant pericardial effusion. The thoracic aorta is normal in caliber. Mild atherosclerotic plaque of the thoracic aorta. No coronary artery calcifications. Mediastinum/Nodes: Enlarged right hilar lymph node measuring up to 1.7 cm. Prominent left hilar lymph nodes. Calcifications along the right left hilar lymph nodes may be sequelae of prior granulomatous disease. No enlarged mediastinal or axillary lymph nodes. Thyroid gland, trachea, and esophagus demonstrate no significant findings.  Lungs/Pleura: Emphysematous changes. Diffuse bronchial wall thickening. Peribronchovascular tree-in-bud nodularity most prominent in the upper lung zones. Linear atelectasis versus scarring within the right middle lobe. No pulmonary nodule. No pulmonary mass. No pleural effusion. No pneumothorax. Musculoskeletal: No chest wall abnormality. No suspicious lytic or blastic osseous lesions. No acute displaced fracture. Multilevel degenerative changes of the spine. Review of the MIP images confirms the above findings. CT ABDOMEN and PELVIS FINDINGS Hepatobiliary: No focal liver abnormality. The gallbladder is contracted. No gallstones, gallbladder wall thickening, or pericholecystic fluid. No biliary dilatation. Pancreas: No focal lesion. Normal pancreatic contour. No surrounding inflammatory changes. No main pancreatic ductal dilatation. Spleen: Normal in size without focal abnormality. Adrenals/Urinary Tract: No adrenal nodule bilaterally. Bilateral kidneys enhance symmetrically. A left renal 1.4 cm fluid density lesion likely  represents a simple renal cyst. Simple renal cysts, in the absence of clinically indicated signs/symptoms, require no independent follow-up. No hydronephrosis. No hydroureter. The urinary bladder is unremarkable. Stomach/Bowel: Stomach is within normal limits. No evidence of bowel wall thickening or dilatation. Scattered colonic diverticulosis appendix appears normal. Vascular/Lymphatic: No abdominal aorta or iliac aneurysm. Severe atherosclerotic plaque of the aorta and its branches. No abdominal, pelvic, or inguinal lymphadenopathy. Reproductive: Prostate is unremarkable. Other: No intraperitoneal free fluid. No intraperitoneal free gas. No organized fluid collection. Musculoskeletal: No abdominal wall hernia or abnormality. 3.7 cm fat density lesion within the right gluteal musculature likely represents lipomatous lesion. No suspicious lytic or blastic osseous lesions. No acute displaced fracture. Grade 1 anterolisthesis of L4 on L5. Review of the MIP images confirms the above findings. IMPRESSION: 1. No central or segmental pulmonary embolus. Limited evaluation of the subsegmental level due to timing of contrast. 2. Pulmonary findings suggestive of an atypical infection. No follow-up needed if patient is low-risk (and has no known or suspected primary neoplasm). Non-contrast chest CT can be considered in 12 months if patient is high-risk. This recommendation follows the consensus statement: Guidelines for Management of Incidental Pulmonary Nodules Detected on CT Images: From the Fleischner Society 2017; Radiology 2017; 284:228-243. 3. Right middle lobe linear atelectasis versus scarring. Developing infection not excluded. 4. Right hilar lymphadenopathy may be reactive in etiology. Recommend attention on follow-up. 5. Colonic diverticulosis with no acute diverticulitis. 6.  Aortic Atherosclerosis (ICD10-I70.0). Electronically Signed   By: Iven Finn M.D.   On: 09/26/2021 19:25   DG Chest Portable 1  View  Result Date: 09/26/2021 CLINICAL DATA:  Shortness of breath. EXAM: PORTABLE CHEST 1 VIEW COMPARISON:  Chest x-ray dated January 28, 2021. FINDINGS: The heart size and mediastinal contours are within normal limits. Normal pulmonary vascularity. Hazy airspace density in the right upper lobe. No pleural effusion or pneumothorax. No acute osseous abnormality. IMPRESSION: 1. Right upper lobe pneumonia. Electronically Signed   By: Titus Dubin M.D.   On: 09/26/2021 16:04    Assessment & Plan:   Problem List Items Addressed This Visit     Alcohol use disorder in remission    Doing well now      CAP (community acquired pneumonia) - Primary    On O2 Needs to stop smoking F/u w/Dr Erin Fulling (Pulm)      Relevant Orders   CBC with Differential/Platelet   Comprehensive metabolic panel   COPD exacerbation (HCC)    On O2 Needs to stop smoking F/u w/Dr Erin Fulling (Pulm)      Protein-calorie malnutrition, severe    Drink Boost or Ensure      Weight loss    Try Boost  Relevant Orders   CBC with Differential/Platelet   Comprehensive metabolic panel      No orders of the defined types were placed in this encounter.     Follow-up: Return in about 3 months (around 01/10/2022) for a follow-up visit.  Walker Kehr, MD

## 2021-10-10 NOTE — Assessment & Plan Note (Signed)
Try Boost

## 2021-10-11 LAB — CBC WITH DIFFERENTIAL/PLATELET
Basophils Absolute: 0.1 10*3/uL (ref 0.0–0.1)
Basophils Relative: 0.8 % (ref 0.0–3.0)
Eosinophils Absolute: 0.1 10*3/uL (ref 0.0–0.7)
Eosinophils Relative: 1.4 % (ref 0.0–5.0)
HCT: 39.7 % (ref 39.0–52.0)
Hemoglobin: 13.4 g/dL (ref 13.0–17.0)
Lymphocytes Relative: 13.6 % (ref 12.0–46.0)
Lymphs Abs: 1.1 10*3/uL (ref 0.7–4.0)
MCHC: 33.8 g/dL (ref 30.0–36.0)
MCV: 99.1 fl (ref 78.0–100.0)
Monocytes Absolute: 0.6 10*3/uL (ref 0.1–1.0)
Monocytes Relative: 7.6 % (ref 3.0–12.0)
Neutro Abs: 6.2 10*3/uL (ref 1.4–7.7)
Neutrophils Relative %: 76.6 % (ref 43.0–77.0)
Platelets: 475 10*3/uL — ABNORMAL HIGH (ref 150.0–400.0)
RBC: 4 Mil/uL — ABNORMAL LOW (ref 4.22–5.81)
RDW: 15 % (ref 11.5–15.5)
WBC: 8.1 10*3/uL (ref 4.0–10.5)

## 2021-10-11 LAB — COMPREHENSIVE METABOLIC PANEL
ALT: 18 U/L (ref 0–53)
AST: 24 U/L (ref 0–37)
Albumin: 3.9 g/dL (ref 3.5–5.2)
Alkaline Phosphatase: 76 U/L (ref 39–117)
BUN: 11 mg/dL (ref 6–23)
CO2: 32 mEq/L (ref 19–32)
Calcium: 9.8 mg/dL (ref 8.4–10.5)
Chloride: 99 mEq/L (ref 96–112)
Creatinine, Ser: 0.82 mg/dL (ref 0.40–1.50)
GFR: 88.55 mL/min (ref >60.00)
Glucose, Bld: 92 mg/dL (ref 70–99)
Potassium: 4.9 mEq/L (ref 3.5–5.1)
Sodium: 138 mEq/L (ref 135–145)
Total Bilirubin: 0.9 mg/dL (ref 0.2–1.2)
Total Protein: 7 g/dL (ref 6.0–8.3)

## 2021-10-17 DIAGNOSIS — H409 Unspecified glaucoma: Secondary | ICD-10-CM

## 2021-10-17 DIAGNOSIS — N3281 Overactive bladder: Secondary | ICD-10-CM | POA: Diagnosis not present

## 2021-10-17 DIAGNOSIS — Z9981 Dependence on supplemental oxygen: Secondary | ICD-10-CM

## 2021-10-17 DIAGNOSIS — J439 Emphysema, unspecified: Secondary | ICD-10-CM | POA: Diagnosis not present

## 2021-10-17 DIAGNOSIS — K5900 Constipation, unspecified: Secondary | ICD-10-CM

## 2021-10-17 DIAGNOSIS — E43 Unspecified severe protein-calorie malnutrition: Secondary | ICD-10-CM | POA: Diagnosis not present

## 2021-10-17 DIAGNOSIS — F17210 Nicotine dependence, cigarettes, uncomplicated: Secondary | ICD-10-CM

## 2021-10-17 DIAGNOSIS — Z8701 Personal history of pneumonia (recurrent): Secondary | ICD-10-CM

## 2021-10-17 DIAGNOSIS — F03A0 Unspecified dementia, mild, without behavioral disturbance, psychotic disturbance, mood disturbance, and anxiety: Secondary | ICD-10-CM | POA: Diagnosis not present

## 2021-10-17 DIAGNOSIS — H269 Unspecified cataract: Secondary | ICD-10-CM

## 2021-10-17 DIAGNOSIS — Z9181 History of falling: Secondary | ICD-10-CM

## 2021-10-17 DIAGNOSIS — F1011 Alcohol abuse, in remission: Secondary | ICD-10-CM

## 2021-10-20 ENCOUNTER — Other Ambulatory Visit (HOSPITAL_COMMUNITY): Payer: Self-pay

## 2021-10-20 ENCOUNTER — Encounter: Payer: Self-pay | Admitting: Pulmonary Disease

## 2021-10-20 ENCOUNTER — Ambulatory Visit: Payer: Federal, State, Local not specified - PPO | Admitting: Pulmonary Disease

## 2021-10-20 VITALS — BP 116/76 | HR 114 | Temp 98.0°F | Ht 73.0 in | Wt 146.4 lb

## 2021-10-20 DIAGNOSIS — J9601 Acute respiratory failure with hypoxia: Secondary | ICD-10-CM

## 2021-10-20 DIAGNOSIS — J432 Centrilobular emphysema: Secondary | ICD-10-CM | POA: Diagnosis not present

## 2021-10-20 MED ORDER — TRELEGY ELLIPTA 100-62.5-25 MCG/ACT IN AEPB: 1.0000 | INHALATION_SPRAY | Freq: Every day | RESPIRATORY_TRACT | 11 refills | Status: DC

## 2021-10-20 MED ORDER — IPRATROPIUM-ALBUTEROL 0.5-2.5 (3) MG/3ML IN SOLN: 3.0000 mL | RESPIRATORY_TRACT | 6 refills | Status: DC | PRN

## 2021-10-20 MED ORDER — TRELEGY ELLIPTA 100-62.5-25 MCG/ACT IN AEPB: 1.0000 | INHALATION_SPRAY | Freq: Every day | RESPIRATORY_TRACT | 0 refills | Status: DC

## 2021-10-20 NOTE — Patient Instructions (Addendum)
Goal oxygen saturation is 88-92%  Recommend using 2L of oxygen, if your oxygen is less than 88% then go back to 3L.  Start trelegy ellipta 1 puff daily - rinse mouth out after each use  Stop arnuity ellipta inhaler while on trelegy  We will check with our pharmacy team on the best inhaler regimen for you based on cost  Use duoneb nebulizer treatments twice daily over the next week or two followed by flutter valve therapy to clear out the mucous  To quit smoking: Recommend using '14mg'$  nicotin patch daily and mini nicotine lozenges as needed for break through craving  Follow up in 2 months with pulmonary function tests. We will assess your need to continue oxygen at that time.

## 2021-10-20 NOTE — Progress Notes (Addendum)
Synopsis: Referred in August 2023 for COPD by Lew Dawes, MD  Subjective:   PATIENT ID: Travis Hicks GENDER: male DOB: 1950-12-26, MRN: [id number]  HPI  Chief Complaint  Patient presents with   Hospitalization Follow-up    3L . SOB on short walks, ADLs, etc. Still has non-productive cough. No wheezing.   Travis Hicks is a 71 year old male, daily smoker with COPD/emphysema who is referred to pulmonary clinic for hospital follow up.   Patient was recently admitted 8/8 to 8/12 for community acquired pneumonia and acute hypoxemic respiratory failure. He was discharged on arnuity and anoro inhaler therapy. He was on anoro therapy prior to admission.   Since discharge he has only been using arnuity ellipta daily as his pharmacy said he did not need both inhalers.   He reports his breathing has not improved much since discharge. He has productive cough. He was discharged on supplemental oxygen and is using 3L day/night. He has run out of albuterol nebulizer solution. Prior to admission he was able to walk to his mail box and back but now he struggles to do that.  He is a retired [employer]. He smokes 8 cigs per day. He was smoking 1 pack per day and has been smoking for 20+ years.  Past Medical History:  Diagnosis Date   Cataract    veru small per pt .   Emphysema of lung ([place])    Glaucoma 2018   Overactive bladder      Family History  Problem Relation Age of Onset   Pancreatic cancer Father    Glaucoma Brother    Colon cancer Neg Hx    Rectal cancer Neg Hx    Stomach cancer Neg Hx    Esophageal cancer Neg Hx    Prostate cancer Neg Hx    Colon polyps Neg Hx      Social History   Socioeconomic History   Marital status: Married    Spouse name: Not on file   Number of children: 3   Years of education: Not on file   Highest education level: Not on file  Occupational History   Occupation: retired  Tobacco Use   Smoking status: Every Day    Packs/day: 1.00     Types: Cigarettes   Smokeless tobacco: Never   Tobacco comments:    tobacco info given  Vaping Use   Vaping Use: Never used  Substance and Sexual Activity   Alcohol use: Not Currently   Drug use: No   Sexual activity: Yes  Other Topics Concern   Not on file  Social History Narrative   Not on file   Social Determinants of Health   Financial Resource Strain: Not on file  Food Insecurity: Not on file  Transportation Needs: Not on file  Physical Activity: Not on file  Stress: Not on file  Social Connections: Not on file  Intimate Partner Violence: Not on file     Allergies  Allergen Reactions   Viagra [Sildenafil Citrate] Other (See Comments)    Unknown per Pt     Outpatient Medications Prior to Visit  Medication Sig Dispense Refill   b complex vitamins tablet Take 1 tablet by mouth daily. 100 tablet 3   Cholecalciferol (VITAMIN D3) 50 MCG (2000 UT) capsule Take 2 capsules (4,000 Units total) by mouth daily. (Patient taking differently: Take 2,000 Units by mouth daily.) 100 capsule 3   donepezil (ARICEPT) 5 MG tablet Take 1 tablet (5 mg total) by  mouth at bedtime. 90 tablet 3   Fluticasone Furoate (ARNUITY ELLIPTA) 50 MCG/ACT AEPB Inhale 1 Inhalation into the lungs daily. 60 each 0   guaiFENesin (MUCINEX) 600 MG 12 hr tablet Take 1 tablet (600 mg total) by mouth 2 (two) times daily. 60 tablet 1   latanoprost (XALATAN) 0.005 % ophthalmic solution Place 1 drop into the left eye at bedtime.     Multiple Vitamin (MULTIVITAMIN WITH MINERALS) TABS tablet Take 1 tablet by mouth daily. 30 tablet 0   nicotine (NICODERM CQ - DOSED IN MG/24 HOURS) 14 mg/24hr patch Place 1 patch (14 mg total) onto the skin daily. 28 patch 0   umeclidinium-vilanterol (ANORO ELLIPTA) 62.5-25 MCG/ACT AEPB Inhale 1 puff into the lungs daily. 1 each 11   albuterol (PROVENTIL) (2.5 MG/3ML) 0.083% nebulizer solution Take 3 mLs (2.5 mg total) by nebulization every 6 (six) hours as needed for wheezing. 75 mL 12    triamcinolone ointment (KENALOG) 0.5 % Apply 1 application topically 3 (three) times daily. 120 g 1   No facility-administered medications prior to visit.    Review of Systems  Constitutional:  Negative for chills, fever, malaise/fatigue and weight loss.  HENT:  Negative for congestion, sinus pain and sore throat.   Eyes: Negative.   Respiratory:  Positive for cough, sputum production, shortness of breath and wheezing. Negative for hemoptysis.   Cardiovascular:  Negative for chest pain, palpitations, orthopnea, claudication and leg swelling.  Gastrointestinal:  Negative for abdominal pain, heartburn, nausea and vomiting.  Genitourinary: Negative.   Musculoskeletal:  Negative for joint pain and myalgias.  Skin:  Negative for rash.  Neurological:  Negative for weakness.  Endo/Heme/Allergies: Negative.   Psychiatric/Behavioral: Negative.        Objective:   Vitals:   10/20/21 1408  BP: 116/76  Pulse: (!) 114  Temp: 98 F (36.7 C)  TempSrc: Oral  SpO2: 98%  Weight: 146 lb 6.4 oz (66.4 kg)  Height: '6\' 1"'$  (1.854 m)     Physical Exam Constitutional:      General: He is not in acute distress. HENT:     Head: Normocephalic and atraumatic.  Eyes:     Extraocular Movements: Extraocular movements intact.     Conjunctiva/sclera: Conjunctivae normal.     Pupils: Pupils are equal, round, and reactive to light.  Cardiovascular:     Rate and Rhythm: Normal rate and regular rhythm.     Pulses: Normal pulses.     Heart sounds: Normal heart sounds. No murmur heard. Pulmonary:     Effort: Pulmonary effort is normal.     Breath sounds: Decreased air movement present. No wheezing, rhonchi or rales.  Abdominal:     General: Bowel sounds are normal.     Palpations: Abdomen is soft.  Musculoskeletal:     Right lower leg: No edema.     Left lower leg: No edema.  Lymphadenopathy:     Cervical: No cervical adenopathy.  Skin:    General: Skin is warm and dry.  Neurological:      General: No focal deficit present.     Mental Status: He is alert.  Psychiatric:        Mood and Affect: Mood normal.        Behavior: Behavior normal.        Thought Content: Thought content normal.        Judgment: Judgment normal.     CBC    Component Value Date/Time   WBC 8.1 10/10/2021 1639  RBC 4.00 (L) 10/10/2021 1639   HGB 13.4 10/10/2021 1639   HCT 39.7 10/10/2021 1639   PLT 475.0 (H) 10/10/2021 1639   MCV 99.1 10/10/2021 1639   MCH 33.2 09/30/2021 0514   MCHC 33.8 10/10/2021 1639   RDW 15.0 10/10/2021 1639   LYMPHSABS 1.1 10/10/2021 1639   MONOABS 0.6 10/10/2021 1639   EOSABS 0.1 10/10/2021 1639   BASOSABS 0.1 10/10/2021 1639      Latest Ref Rng & Units 10/10/2021    4:39 PM 10/01/2021    8:42 AM 09/30/2021    5:14 AM  BMP  Glucose 70 - 99 mg/dL 92  134  95   BUN 6 - 23 mg/dL '11  8  11   '$ Creatinine 0.40 - 1.50 mg/dL 0.82  0.78  0.89   Sodium 135 - 145 mEq/L 138  141  143   Potassium 3.5 - 5.1 mEq/L 4.9  3.7  3.0   Chloride 96 - 112 mEq/L 99  96  100   CO2 19 - 32 mEq/L 32  38  37   Calcium 8.4 - 10.5 mg/dL 9.8  8.9  8.6    Chest imaging: CT Chest 09/26/21 Mediastinum/Nodes: Enlarged right hilar lymph node measuring up to 1.7 cm. Prominent left hilar lymph nodes. Calcifications along the right left hilar lymph nodes may be sequelae of prior granulomatous disease. No enlarged mediastinal or axillary lymph nodes. Thyroid gland, trachea, and esophagus demonstrate no significant findings.   Lungs/Pleura: Emphysematous changes. Diffuse bronchial wall thickening. Peribronchovascular tree-in-bud nodularity most prominent in the upper lung zones. Linear atelectasis versus scarring within the right middle lobe. No pulmonary nodule. No pulmonary mass. No pleural effusion. No pneumothorax.  PFT:     No data to display          Labs:  Path:  Echo:  Heart Catheterization:  Assessment & Plan:   Centrilobular emphysema ([place]) - Plan: Pulmonary Function  Test, ipratropium-albuterol (DUONEB) 0.5-2.5 (3) MG/3ML SOLN  Acute hypoxemic respiratory failure ([facility])  Discussion: Travis Hicks is a 71 year old male, daily smoker with COPD/emphysema who is referred to pulmonary clinic for hospital follow up.   He has centrilobular emphysema based on recent CT Chest scan.   He is to start trelegy ellipta 1 puff daily. Samples provided today.  We will message our pharmacy team to determine which inhalers are best covered to provide him ICS/LAMA/LABA therapy.   Follow up in 2 months with PFTs and evaluation of continued need for oxygen.  Travis Jackson, MD [facility] Office: [phone]   ADDENDUM: Trelegy Covered for $35 co-pay per pharmacy PA team. Prescription sent to pharmacy.    Current Outpatient Medications:    Fluticasone-Umeclidin-Vilant (TRELEGY ELLIPTA) 100-62.5-25 MCG/ACT AEPB, Inhale 1 puff into the lungs daily., Disp: 1 each, Rfl: 0   ipratropium-albuterol (DUONEB) 0.5-2.5 (3) MG/3ML SOLN, Take 3 mLs by nebulization every 4 (four) hours as needed., Disp: 360 mL, Rfl: 6   b complex vitamins tablet, Take 1 tablet by mouth daily., Disp: 100 tablet, Rfl: 3   Cholecalciferol (VITAMIN D3) 50 MCG (2000 UT) capsule, Take 2 capsules (4,000 Units total) by mouth daily. (Patient taking differently: Take 2,000 Units by mouth daily.), Disp: 100 capsule, Rfl: 3   donepezil (ARICEPT) 5 MG tablet, Take 1 tablet (5 mg total) by mouth at bedtime., Disp: 90 tablet, Rfl: 3   Fluticasone Furoate (ARNUITY ELLIPTA) 50 MCG/ACT AEPB, Inhale 1 Inhalation into the lungs daily., Disp: 60 each, Rfl:  0   guaiFENesin (MUCINEX) 600 MG 12 hr tablet, Take 1 tablet (600 mg total) by mouth 2 (two) times daily., Disp: 60 tablet, Rfl: 1   latanoprost (XALATAN) 0.005 % ophthalmic solution, Place 1 drop into the left eye at bedtime., Disp: , Rfl:    Multiple Vitamin (MULTIVITAMIN WITH MINERALS) TABS tablet, Take 1 tablet by mouth daily., Disp: 30  tablet, Rfl: 0   nicotine (NICODERM CQ - DOSED IN MG/24 HOURS) 14 mg/24hr patch, Place 1 patch (14 mg total) onto the skin daily., Disp: 28 patch, Rfl: 0   umeclidinium-vilanterol (ANORO ELLIPTA) 62.5-25 MCG/ACT AEPB, Inhale 1 puff into the lungs daily., Disp: 1 each, Rfl: 11

## 2021-10-20 NOTE — Addendum Note (Signed)
Addended by: Freda Jackson on: 10/20/2021 04:04 PM   Modules accepted: Orders

## 2021-10-31 ENCOUNTER — Ambulatory Visit (INDEPENDENT_AMBULATORY_CARE_PROVIDER_SITE_OTHER): Payer: Federal, State, Local not specified - PPO

## 2021-10-31 ENCOUNTER — Encounter: Payer: Self-pay | Admitting: Adult Health

## 2021-10-31 ENCOUNTER — Ambulatory Visit (INDEPENDENT_AMBULATORY_CARE_PROVIDER_SITE_OTHER): Payer: Federal, State, Local not specified - PPO | Admitting: Adult Health

## 2021-10-31 VITALS — BP 124/80 | HR 81 | Temp 97.8°F | Ht 73.0 in | Wt 149.8 lb

## 2021-10-31 DIAGNOSIS — J441 Chronic obstructive pulmonary disease with (acute) exacerbation: Secondary | ICD-10-CM

## 2021-10-31 DIAGNOSIS — J432 Centrilobular emphysema: Secondary | ICD-10-CM

## 2021-10-31 DIAGNOSIS — R3915 Urgency of urination: Secondary | ICD-10-CM

## 2021-10-31 DIAGNOSIS — E43 Unspecified severe protein-calorie malnutrition: Secondary | ICD-10-CM | POA: Diagnosis not present

## 2021-10-31 DIAGNOSIS — J9611 Chronic respiratory failure with hypoxia: Secondary | ICD-10-CM | POA: Insufficient documentation

## 2021-10-31 MED ORDER — PREDNISONE 20 MG PO TABS: 20.0000 mg | ORAL_TABLET | Freq: Every day | ORAL | 0 refills | Status: DC

## 2021-10-31 MED ORDER — ALBUTEROL SULFATE (2.5 MG/3ML) 0.083% IN NEBU: 2.5000 mg | INHALATION_SOLUTION | RESPIRATORY_TRACT | 5 refills | Status: DC | PRN

## 2021-10-31 NOTE — Assessment & Plan Note (Signed)
Urinary urgency, frequency-we will check urinalysis and urine culture.  Patient is on Trelegy and DuoNeb.  Will change DuoNeb over to albuterol only as LAMA and SAMA may be contributing to urinary issues.  Plan  Patient Instructions  Prednisone '20mg'$  daily for 5 days.  Mucinex DM or Robitussin DM 1 tsp every 4hr as needed for cough/congestion .  Continue on TRELEGY 1 puff daily  Stop Duoneb (Albuterol /Ipratropium) . Begin Albuterol neb every 4hr as needed  Urine sample today .  Continue on Oxygen 3l/m  Chest xray today  Follow up with Dr. Erin Fulling next month as planned with PFT and As needed   Please contact office for sooner follow up if symptoms do not improve or worsen or seek emergency care

## 2021-10-31 NOTE — Assessment & Plan Note (Signed)
Encouraged on a high-protein diet 

## 2021-10-31 NOTE — Assessment & Plan Note (Addendum)
A COPD exacerbation.  Patient is encouraged on smoking cessation.  Check chest x-ray today.  We will give a short course of prednisone. Patient education on COPD, pursed lip breathing PFT pending   Plan  Patient Instructions  Prednisone '20mg'$  daily for 5 days.  Mucinex DM or Robitussin DM 1 tsp every 4hr as needed for cough/congestion .  Continue on TRELEGY 1 puff daily  Stop Duoneb (Albuterol /Ipratropium) . Begin Albuterol neb every 4hr as needed  Urine sample today .  Continue on Oxygen 3l/m  Chest xray today  Follow up with Dr. Erin Fulling next month as planned with PFT and As needed   Please contact office for sooner follow up if symptoms do not improve or worsen or seek emergency care

## 2021-10-31 NOTE — Assessment & Plan Note (Signed)
Continue on oxygen to maintain O2 saturations greater than 88 to 90%. 

## 2021-10-31 NOTE — Patient Instructions (Addendum)
Prednisone '20mg'$  daily for 5 days.  Mucinex DM or Robitussin DM 1 tsp every 4hr as needed for cough/congestion .  Continue on TRELEGY 1 puff daily  Stop Duoneb (Albuterol /Ipratropium) . Begin Albuterol neb every 4hr as needed  Urine sample today .  Continue on Oxygen 3l/m  Chest xray today  Follow up with Dr. Erin Fulling next month as planned with PFT and As needed   Please contact office for sooner follow up if symptoms do not improve or worsen or seek emergency care

## 2021-10-31 NOTE — Progress Notes (Signed)
$'@Patient'w$  ID: Travis Hicks, male    DOB: [birth year], 71 y.o.   MRN: [id number]  Chief Complaint  Patient presents with   Follow-up    Referring provider: Plotnikov, Evie Lacks, MD  HPI: 71 year old male active smoker followed for COPD with emphysema and chronic respiratory failure on home oxygen  TEST/EVENTS :  Mediastinum/Nodes: Enlarged right hilar lymph node measuring up to 1.7 cm. Prominent left hilar lymph nodes. Calcifications along the right left hilar lymph nodes may be sequelae of prior granulomatous disease. No enlarged mediastinal or axillary lymph nodes. Thyroid gland, trachea, and esophagus demonstrate no significant findings.   Lungs/Pleura: Emphysematous changes. Diffuse bronchial wall thickening. Peribronchovascular tree-in-bud nodularity most prominent in the upper lung zones. Linear atelectasis versus scarring within the right middle lobe. No pulmonary nodule. No pulmonary mass. No pleural effusion. No pneumothorax.  10/31/2021 Follow up  : COPD with emphysema and oxygen dependent respiratory failure Patient presents for a work in visit.  Complains of ongoing cough congestion over the last few weeks.  Patient was seen for pulmonary consult 2 weeks ago after recent hospitalization early August 2023 for community-acquired pneumonia and acute respiratory failure.  Respiratory viral panel was negative.  Strep pneumoniae and Legionella were negative.  Patient was treated with IV antibiotics and transitioned over to Augmentin at discharge.  Last visit patient was changed from [place] and Anoro to Trelegy.  Patient says he Has ongoing cough/congestion. Had episode 2 days ago where he started having trouble sleeping with increased shortness of breath, noted O2 sats around 84% on 2l/m . Turned O2 up to 3l/m . Had to call EMS, Was given neb treatments x 2. Has kept O2 on 3l/m . Is feeling some better but remains weak. PFTs are pending. Gets winded easily.  Patient denies any  hemoptysis, fever, chest pain, orthopnea, calf pain or edema.  Appetite is okay but has been decreased from his normal.  Patient does complain of urinary issues some frequency occasional dysuria.  Patient is typically taken DuoNeb twice daily along with Trelegy daily.  He denies any discharge, back pain, fever.  Allergies  Allergen Reactions   Viagra [Sildenafil Citrate] Other (See Comments)    Unknown per Pt    Immunization History  Administered Date(s) Administered   PFIZER(Purple Top)SARS-COV-2 Vaccination 04/04/2019, 04/29/2019, 12/02/2019    Past Medical History:  Diagnosis Date   Cataract    veru small per pt .   Emphysema of lung ([place])    Glaucoma 2018   Overactive bladder     Tobacco History: Social History   Tobacco Use  Smoking Status Every Day   Packs/day: 1.00   Types: Cigarettes  Smokeless Tobacco Never  Tobacco Comments   3 cigs a day. 10/31/21.   Ready to quit: Not Answered Counseling given: Not Answered Tobacco comments: 3 cigs a day. 10/31/21.   Outpatient Medications Prior to Visit  Medication Sig Dispense Refill   b complex vitamins tablet Take 1 tablet by mouth daily. 100 tablet 3   Cholecalciferol (VITAMIN D3) 50 MCG (2000 UT) capsule Take 2 capsules (4,000 Units total) by mouth daily. (Patient taking differently: Take 2,000 Units by mouth daily.) 100 capsule 3   donepezil (ARICEPT) 5 MG tablet Take 1 tablet (5 mg total) by mouth at bedtime. 90 tablet 3   Fluticasone-Umeclidin-Vilant (TRELEGY ELLIPTA) 100-62.5-25 MCG/ACT AEPB Inhale 1 puff into the lungs daily. 28 each 11   guaiFENesin (MUCINEX) 600 MG 12 hr tablet Take 1 tablet (600 mg total) by  mouth 2 (two) times daily. 60 tablet 1   latanoprost (XALATAN) 0.005 % ophthalmic solution Place 1 drop into the left eye at bedtime.     Multiple Vitamin (MULTIVITAMIN WITH MINERALS) TABS tablet Take 1 tablet by mouth daily. 30 tablet 0   nicotine (NICODERM CQ - DOSED IN MG/24 HOURS) 14 mg/24hr patch  Place 1 patch (14 mg total) onto the skin daily. 28 patch 0   ipratropium-albuterol (DUONEB) 0.5-2.5 (3) MG/3ML SOLN Take 3 mLs by nebulization every 4 (four) hours as needed. 360 mL 6   Fluticasone Furoate (ARNUITY ELLIPTA) 50 MCG/ACT AEPB Inhale 1 Inhalation into the lungs daily. (Patient not taking: Reported on 10/31/2021) 60 each 0   Fluticasone-Umeclidin-Vilant (TRELEGY ELLIPTA) 100-62.5-25 MCG/ACT AEPB Inhale 1 puff into the lungs daily. (Patient not taking: Reported on 10/31/2021) 1 each 0   umeclidinium-vilanterol (ANORO ELLIPTA) 62.5-25 MCG/ACT AEPB Inhale 1 puff into the lungs daily. (Patient not taking: Reported on 10/31/2021) 1 each 11   No facility-administered medications prior to visit.     Review of Systems:   Constitutional:   No  weight loss, night sweats,  Fevers, chills,  +fatigue, or  lassitude.  HEENT:   No headaches,  Difficulty swallowing,  Tooth/dental problems, or  Sore throat,                No sneezing, itching, ear ache, nasal congestion, post nasal drip,   CV:  No chest pain,  Orthopnea, PND, swelling in lower extremities, anasarca, dizziness, palpitations, syncope.   GI  No heartburn, indigestion, abdominal pain, nausea, vomiting, diarrhea, change in bowel habits, loss of appetite, bloody stools.   Resp:   No chest wall deformity  Skin: no rash or lesions.  GU: no  change in color of urine,   No flank pain, no hematuria   MS:  No joint pain or swelling.  No decreased range of motion.  No back pain.    Physical Exam  BP 124/80 (BP Location: Left Arm, Cuff Size: Normal)   Pulse 81   Temp 97.8 F (36.6 C)   Ht '6\' 1"'$  (1.854 m)   Wt 149 lb 12.8 oz (67.9 kg)   SpO2 95%   BMI 19.76 kg/m   GEN: A/Ox3; pleasant , NAD, frail, elderly, on oxygen   HEENT:  /AT,  EACs-clear, TMs-wnl, NOSE-clear, THROAT-clear, no lesions, no postnasal drip or exudate noted.   NECK:  Supple w/ fair ROM; no JVD; normal carotid impulses w/o bruits; no thyromegaly or  nodules palpated; no lymphadenopathy.    RESP  Clear  P & A; w/o, wheezes/ rales/ or rhonchi. no accessory muscle use, no dullness to percussion  CARD:  RRR, no m/r/g, no peripheral edema, pulses intact, no cyanosis or clubbing.  GI:   Soft & nt; nml bowel sounds; no organomegaly or masses detected.   Musco: Warm bil, no deformities or joint swelling noted.   Neuro: alert, no focal deficits noted.    Skin: Warm, no lesions or rashes    Lab Results:      ProBNP No results found for: "PROBNP"  Imaging: No results found.        No data to display          No results found for: "NITRICOXIDE"      Assessment & Plan:   COPD exacerbation ([place]) A COPD exacerbation.  Patient is encouraged on smoking cessation.  Check chest x-ray today.  We will give a short course of prednisone. Patient education  on COPD, pursed lip breathing  Plan  Patient Instructions  Prednisone '20mg'$  daily for 5 days.  Mucinex DM or Robitussin DM 1 tsp every 4hr as needed for cough/congestion .  Continue on TRELEGY 1 puff daily  Stop Duoneb (Albuterol /Ipratropium) . Begin Albuterol neb every 4hr as needed  Urine sample today .  Continue on Oxygen 3l/m  Chest xray today  Follow up with Dr. Erin Fulling next month as planned with PFT and As needed   Please contact office for sooner follow up if symptoms do not improve or worsen or seek emergency care          Protein-calorie malnutrition, severe Encouraged on a high-protein diet  Chronic respiratory failure with hypoxia ([place]) Continue on oxygen to maintain O2 saturations greater than 88 to 90%.  Urinary urgency Urinary urgency, frequency-we will check urinalysis and urine culture.  Patient is on Trelegy and DuoNeb.  Will change DuoNeb over to albuterol only as LAMA and SAMA may be contributing to urinary issues.  Plan  Patient Instructions  Prednisone '20mg'$  daily for 5 days.  Mucinex DM or Robitussin DM 1 tsp every 4hr as needed for  cough/congestion .  Continue on TRELEGY 1 puff daily  Stop Duoneb (Albuterol /Ipratropium) . Begin Albuterol neb every 4hr as needed  Urine sample today .  Continue on Oxygen 3l/m  Chest xray today  Follow up with Dr. Erin Fulling next month as planned with PFT and As needed   Please contact office for sooner follow up if symptoms do not improve or worsen or seek emergency care           I spent   40 minutes dedicated to the care of this patient on the date of this encounter to include pre-visit review of records, face-to-face time with the patient discussing conditions above, post visit ordering of testing, clinical documentation with the electronic health record, making appropriate referrals as documented, and communicating necessary findings to members of the patients care team.   Rexene Edison, NP 10/31/2021

## 2021-11-01 LAB — URINALYSIS, ROUTINE W REFLEX MICROSCOPIC
Bilirubin Urine: NEGATIVE
Ketones, ur: NEGATIVE
Leukocytes,Ua: NEGATIVE
Nitrite: NEGATIVE
Specific Gravity, Urine: 1.02 (ref 1.000–1.030)
Urine Glucose: NEGATIVE
Urobilinogen, UA: 1 (ref 0.0–1.0)
pH: 6.5 (ref 5.0–8.0)

## 2021-11-01 LAB — URINE CULTURE
MICRO NUMBER:: 13899737
SPECIMEN QUALITY:: ADEQUATE

## 2021-11-04 ENCOUNTER — Telehealth: Payer: Self-pay | Admitting: Adult Health

## 2021-11-04 NOTE — Telephone Encounter (Signed)
Melvenia Needles, NP  11/03/2021  4:07 PM EDT     Urinalysis and urine culture did not show a urinary tract infection. Did show some ongoing hematuria recommend follow-up with primary care for ongoing management Please contact office for sooner follow up if symptoms do not improve or worsen or seek emergency care     Melvenia Needles, NP  11/03/2021  4:09 PM EDT     Chest x-ray shows some increased interstitial markings may be secondary to COPD flare.  No acute consolidation or effusions noted.  Continue with office visit recommendations and follow-up   Please contact office for sooner follow up if symptoms do not improve or worsen or seek emergency care      Called and spoke with pt letting him know the results of both the urinalysis and cxr and he verbalized understanding.nothing further needed.

## 2021-11-04 NOTE — Progress Notes (Signed)
ATC x1.  LVM to return call. 

## 2021-11-14 ENCOUNTER — Telehealth: Payer: Self-pay | Admitting: Adult Health

## 2021-11-14 DIAGNOSIS — J9611 Chronic respiratory failure with hypoxia: Secondary | ICD-10-CM

## 2021-11-18 NOTE — Telephone Encounter (Signed)
Called patient but he did not answer. Left message for patient to call back.  °

## 2021-11-21 NOTE — Telephone Encounter (Signed)
Order for POC has been placed. Patient is aware.

## 2021-11-21 NOTE — Telephone Encounter (Signed)
Patient returning call.

## 2021-11-21 NOTE — Telephone Encounter (Signed)
Yes ok to order POC.  JD

## 2021-11-21 NOTE — Telephone Encounter (Signed)
Called and spoke with patient. He stated that he is interested in having a POC from [employer]. He currently has tanks and they are difficult to carry around outside of the house. He confirmed that he is using 3L of O2.    Dr. Erin Fulling, please advise if you are ok with [place] ordering a POC for him. Thanks!

## 2021-11-28 ENCOUNTER — Telehealth: Payer: Self-pay | Admitting: Adult Health

## 2021-11-28 NOTE — Telephone Encounter (Signed)
Updated [facility] that patient called office a week ago and confirmed that he was using tanks of oxygen and was having hard time walking around and was wanting POC. Patient confirmed he was on 3L. Dr Erin Fulling agreed to [place] order. POC order was placed. Nothing further needed

## 2021-12-05 ENCOUNTER — Telehealth: Payer: Self-pay | Admitting: Pulmonary Disease

## 2021-12-05 DIAGNOSIS — J439 Emphysema, unspecified: Secondary | ICD-10-CM

## 2021-12-05 DIAGNOSIS — Z8701 Personal history of pneumonia (recurrent): Secondary | ICD-10-CM

## 2021-12-05 DIAGNOSIS — K5900 Constipation, unspecified: Secondary | ICD-10-CM

## 2021-12-05 DIAGNOSIS — N3281 Overactive bladder: Secondary | ICD-10-CM

## 2021-12-05 DIAGNOSIS — F1011 Alcohol abuse, in remission: Secondary | ICD-10-CM

## 2021-12-05 DIAGNOSIS — H269 Unspecified cataract: Secondary | ICD-10-CM

## 2021-12-05 DIAGNOSIS — F17210 Nicotine dependence, cigarettes, uncomplicated: Secondary | ICD-10-CM

## 2021-12-05 DIAGNOSIS — F03A0 Unspecified dementia, mild, without behavioral disturbance, psychotic disturbance, mood disturbance, and anxiety: Secondary | ICD-10-CM

## 2021-12-05 DIAGNOSIS — Z9181 History of falling: Secondary | ICD-10-CM

## 2021-12-05 DIAGNOSIS — H409 Unspecified glaucoma: Secondary | ICD-10-CM

## 2021-12-05 DIAGNOSIS — J449 Chronic obstructive pulmonary disease, unspecified: Secondary | ICD-10-CM

## 2021-12-05 DIAGNOSIS — E43 Unspecified severe protein-calorie malnutrition: Secondary | ICD-10-CM

## 2021-12-05 DIAGNOSIS — Z9981 Dependence on supplemental oxygen: Secondary | ICD-10-CM

## 2021-12-06 NOTE — Telephone Encounter (Signed)
Called patient and went over medications with him. He was unsure if he had to continue the DuoNebs, but per tammy avs he was supposed to stop duoneb treatments and to start albuterol every 4hrs as needed. He verbalized understanding. Nothing further needed

## 2021-12-13 ENCOUNTER — Telehealth: Payer: Self-pay | Admitting: Pulmonary Disease

## 2021-12-13 ENCOUNTER — Telehealth: Payer: Self-pay | Admitting: Adult Health

## 2021-12-13 NOTE — Telephone Encounter (Signed)
Patient states [facility] did not have an order for POC- called [employer] and was told they do have an order but patient needs to be walked for o2 stats. Pt has an appt tomorrow with JD. Also gave patient [employer] number [phone] to avoid customer service number. Please advise on walk test.

## 2021-12-13 NOTE — Telephone Encounter (Signed)
Pt has PFT 10/25. He was originally scheduled to see JD afterwards, but due to provider being ill pt is now requesting for PFT results to be delivered via phone call. Pt was offered several different aptt times with an app, but he is unsure of when he would be able to come in and thought this would be easiest. Please provide PFT results when they are available.

## 2021-12-14 ENCOUNTER — Ambulatory Visit: Payer: Federal, State, Local not specified - PPO | Admitting: Pulmonary Disease

## 2021-12-14 ENCOUNTER — Ambulatory Visit (INDEPENDENT_AMBULATORY_CARE_PROVIDER_SITE_OTHER): Payer: Federal, State, Local not specified - PPO | Admitting: Pulmonary Disease

## 2021-12-14 DIAGNOSIS — J432 Centrilobular emphysema: Secondary | ICD-10-CM | POA: Diagnosis not present

## 2021-12-14 LAB — PULMONARY FUNCTION TEST
DL/VA % pred: 59 %
DL/VA: 2.38 ml/min/mmHg/L
DLCO cor % pred: 52 %
DLCO cor: 14.44 ml/min/mmHg
DLCO unc % pred: 52 %
DLCO unc: 14.44 ml/min/mmHg
FEF 25-75 Post: 0.47 L/sec
FEF 25-75 Pre: 0.57 L/sec
FEF2575-%Change-Post: -17 %
FEF2575-%Pred-Post: 17 %
FEF2575-%Pred-Pre: 21 %
FEV1-%Change-Post: -12 %
FEV1-%Pred-Post: 38 %
FEV1-%Pred-Pre: 43 %
FEV1-Post: 1.34 L
FEV1-Pre: 1.53 L
FEV1FVC-%Change-Post: -5 %
FEV1FVC-%Pred-Pre: 61 %
FEV6-%Change-Post: -6 %
FEV6-%Pred-Post: 65 %
FEV6-%Pred-Pre: 70 %
FEV6-Post: 2.92 L
FEV6-Pre: 3.14 L
FEV6FVC-%Change-Post: 0 %
FEV6FVC-%Pred-Post: 98 %
FEV6FVC-%Pred-Pre: 98 %
FVC-%Change-Post: -6 %
FVC-%Pred-Post: 66 %
FVC-%Pred-Pre: 71 %
FVC-Post: 3.15 L
FVC-Pre: 3.38 L
Post FEV1/FVC ratio: 42 %
Post FEV6/FVC ratio: 93 %
Pre FEV1/FVC ratio: 45 %
Pre FEV6/FVC Ratio: 93 %
RV % pred: 230 %
RV: 5.94 L
TLC % pred: 127 %
TLC: 9.46 L

## 2021-12-14 NOTE — Progress Notes (Signed)
Full PFT Performed Today  

## 2021-12-14 NOTE — Patient Instructions (Signed)
Full PFT Performed Today  

## 2021-12-14 NOTE — Telephone Encounter (Signed)
Left message for patient to call back  

## 2021-12-19 ENCOUNTER — Ambulatory Visit: Payer: Federal, State, Local not specified - PPO | Admitting: Internal Medicine

## 2021-12-22 ENCOUNTER — Ambulatory Visit: Payer: Federal, State, Local not specified - PPO | Admitting: Pulmonary Disease

## 2021-12-22 ENCOUNTER — Encounter: Payer: Self-pay | Admitting: Pulmonary Disease

## 2021-12-22 VITALS — BP 124/82 | HR 114 | Temp 98.4°F | Ht 73.0 in | Wt 145.4 lb

## 2021-12-22 DIAGNOSIS — J441 Chronic obstructive pulmonary disease with (acute) exacerbation: Secondary | ICD-10-CM

## 2021-12-22 DIAGNOSIS — E43 Unspecified severe protein-calorie malnutrition: Secondary | ICD-10-CM | POA: Diagnosis not present

## 2021-12-22 DIAGNOSIS — J9611 Chronic respiratory failure with hypoxia: Secondary | ICD-10-CM

## 2021-12-22 DIAGNOSIS — J432 Centrilobular emphysema: Secondary | ICD-10-CM

## 2021-12-22 DIAGNOSIS — R9389 Abnormal findings on diagnostic imaging of other specified body structures: Secondary | ICD-10-CM

## 2021-12-22 NOTE — Addendum Note (Signed)
Addended by: Valerie Salts on: 12/22/2021 05:04 PM   Modules accepted: Orders

## 2021-12-22 NOTE — Patient Instructions (Signed)
Chronic respiratory failure with hypoxemia: Continue using 2 L continuously during the day, 3 L when asleep We will assess for a portable oxygen concentrator today  Severe COPD, recent severe exacerbation: Continue Trelegy 1 puff daily no matter how you feel Albuterol as needed for chest tightness wheezing or shortness of breath  Weight loss, abnormal CT scan of the chest: We will obtain sputum for bacterial, fungal, and AFB organisms.  Please provide [place] with a total of 3 separate cups.  We will see you back with Dr. Erin Fulling in 6 to 8 weeks or sooner if needed

## 2021-12-22 NOTE — Progress Notes (Signed)
Synopsis: Followed by Travis Hicks for COPD, chronic respiratory failure with hypoxemia  Subjective:   PATIENT ID: Travis Hicks GENDER: male DOB: 05/23/1950, MRN: [id number]   HPI  Chief Complaint  Patient presents with   Follow-up    PT Follow-up No changes since last visit    He was diagnosed with COPD years ago He was hospitalized in August and was discharged on oxygen He says taht since he saw Travis Hicks he had a spell where he couldn't breathe and called 911.  The EMS team gave him a breathing treatment and he feels better.  He had trouble had a problem breathing a month ago when he tried turning his oxygen down to 2Lpm.  He says that at night he could barely breathe.  Since then he has turned it down to 2 during the daytime and 3Lpm when he sleeps.  He keeps it on 2LPM when he walks around.  He says for the last 2 weeks he has been doing OK.   He is still coughing up yellow mucus.  Taking Trelegy once per day.    He had the Shingles shot, he doesn't want a flu shot.    He has a hard time swallowing depite chewing a lot.  He says that the food won't go down. He's been evaluated for this when in the hospital and was told he wasn't aspirating.  09/2021 SLP evaluation showed no sign of aspiration  Past Medical History:  Diagnosis Date   Cataract    veru small per pt .   Emphysema of lung (Travis Hicks)    Glaucoma 2018   Overactive bladder      Family History  Problem Relation Age of Onset   Pancreatic cancer Father    Glaucoma Brother    Colon cancer Neg Hx    Rectal cancer Neg Hx    Stomach cancer Neg Hx    Esophageal cancer Neg Hx    Prostate cancer Neg Hx    Colon polyps Neg Hx      Social History   Socioeconomic History   Marital status: Married    Spouse name: Not on file   Number of children: 3   Years of education: Not on file   Highest education level: Not on file  Occupational History   Occupation: retired  Tobacco Use   Smoking status: Every Day     Packs/day: 1.00    Types: Cigarettes   Smokeless tobacco: Never   Tobacco comments:    3 cigs a day. 10/31/21.  Vaping Use   Vaping Use: Never used  Substance and Sexual Activity   Alcohol use: Not Currently   Drug use: No   Sexual activity: Yes  Other Topics Concern   Not on file  Social History Narrative   Not on file   Social Determinants of Health   Financial Resource Strain: Not on file  Food Insecurity: Not on file  Transportation Needs: Not on file  Physical Activity: Not on file  Stress: Not on file  Social Connections: Not on file  Intimate Partner Violence: Not on file     Allergies  Allergen Reactions   Viagra [Sildenafil Citrate] Other (See Comments)    Unknown per Pt     Outpatient Medications Prior to Visit  Medication Sig Dispense Refill   albuterol (PROVENTIL) (2.5 MG/3ML) 0.083% nebulizer solution Take 3 mLs (2.5 mg total) by nebulization every 4 (four) hours as needed for wheezing or shortness of breath. 75 mL  5   b complex vitamins tablet Take 1 tablet by mouth daily. 100 tablet 3   Cholecalciferol (VITAMIN D3) 50 MCG (2000 UT) capsule Take 2 capsules (4,000 Units total) by mouth daily. (Patient taking differently: Take 2,000 Units by mouth daily.) 100 capsule 3   donepezil (ARICEPT) 5 MG tablet Take 1 tablet (5 mg total) by mouth at bedtime. 90 tablet 3   Fluticasone-Umeclidin-Vilant (TRELEGY ELLIPTA) 100-62.5-25 MCG/ACT AEPB Inhale 1 puff into the lungs daily. 28 each 11   guaiFENesin (MUCINEX) 600 MG 12 hr tablet Take 1 tablet (600 mg total) by mouth 2 (two) times daily. 60 tablet 1   latanoprost (XALATAN) 0.005 % ophthalmic solution Place 1 drop into the left eye at bedtime.     Multiple Vitamin (MULTIVITAMIN WITH MINERALS) TABS tablet Take 1 tablet by mouth daily. 30 tablet 0   nicotine (NICODERM CQ - DOSED IN MG/24 HOURS) 14 mg/24hr patch Place 1 patch (14 mg total) onto the skin daily. 28 patch 0   predniSONE (DELTASONE) 20 MG tablet Take 1 tablet  (20 mg total) by mouth daily with breakfast. 5 tablet 0   No facility-administered medications prior to visit.    Review of Systems  Constitutional:  Positive for malaise/fatigue and weight loss. Negative for chills and fever.  HENT:  Negative for nosebleeds and sore throat.   Respiratory:  Positive for cough, sputum production and shortness of breath.   Cardiovascular:  Negative for chest pain, orthopnea and claudication.  Skin:  Negative for itching and rash.      Objective:  Physical Exam   Vitals:   12/22/21 1609  BP: 124/82  Pulse: (!) 114  Temp: 98.4 F (36.9 C)  TempSrc: Oral  SpO2: 98%  Weight: 145 lb 6.4 oz (66 kg)  Height: '6\' 1"'$  (1.854 m)   2L   Gen: chronically ill appearing HENT: OP clear, TM's clear, neck supple PULM: poor air movement, some wheezing bases, normal percussion CV: RRR, no mgr, trace edema GI: BS+, soft, nontender Derm: no cyanosis or rash Psyche: normal mood and affect    CBC    Component Value Date/Time   WBC 8.1 10/10/2021 1639   RBC 4.00 (L) 10/10/2021 1639   HGB 13.4 10/10/2021 1639   HCT 39.7 10/10/2021 1639   PLT 475.0 (H) 10/10/2021 1639   MCV 99.1 10/10/2021 1639   MCH 33.2 09/30/2021 0514   MCHC 33.8 10/10/2021 1639   RDW 15.0 10/10/2021 1639   LYMPHSABS 1.1 10/10/2021 1639   MONOABS 0.6 10/10/2021 1639   EOSABS 0.1 10/10/2021 1639   BASOSABS 0.1 10/10/2021 1639     Chest imaging: August 2023 CT angiogram chest showed no pulmonary embolism, moderate to severe centrilobular emphysema upper lobe predominant, right-sided tree-in-bud abnormalities and right lower lobe predominant bronchiectasis  PFT: October 2023 full PFT ratio 42%, FEV1 1.34 L 38% predicted, total lung capacity 9.46 L 127% predicted, residual volume 230% predicted, DLCO 14.44 52% predicted  Labs:  Path:  Echo:  Heart Catheterization:       Assessment & Plan:   Centrilobular emphysema (HCC)  Chronic respiratory failure with hypoxia  (HCC)  COPD exacerbation (HCC)  Protein-calorie malnutrition, severe  Abnormal CT of the chest  Discussion: Travis Hicks is here for follow-up.  He had a severe exacerbation earlier this year.  He has severe COPD and chronic respiratory failure with hypoxemia.  I am concerned about the fact that he is still producing mucus and losing some weight.  His CT scan  of the chest showed tree-in-bud abnormalities which was worrisome for a an atypical infection versus aspiration.  He had a negative swallow evaluation by speech therapy.  So given the chronic bronchitis and weight loss I am concerned about an atypical mycobacterial infection.  However, if testing for that is negative then he may need to have further evaluation of dysphagia performed.  In regards to exacerbation prevention I think the main focus today will be to look to see if there is evidence of an underlying infection.  If cultures are negative then he may benefit from something like a daily azithromycin or Roflumilast.  Plan: Chronic respiratory failure with hypoxemia: Continue using 2 L continuously during the day, 3 L when asleep We will assess for a portable oxygen concentrator today  Severe COPD, recent severe exacerbation: Continue Trelegy 1 puff daily no matter how you feel Albuterol as needed for chest tightness wheezing or shortness of breath  Weight loss, abnormal CT scan of the chest: We will obtain sputum for bacterial, fungal, and AFB organisms.  Please provide [place] with a total of 3 separate cups.  We will see you back with Travis Hicks in 6 to 8 weeks or sooner if needed  Immunizations: Immunization History  Administered Date(s) Administered   PFIZER(Purple Top)SARS-COV-2 Vaccination 04/04/2019, 04/29/2019, 12/02/2019     Current Outpatient Medications:    albuterol (PROVENTIL) (2.5 MG/3ML) 0.083% nebulizer solution, Take 3 mLs (2.5 mg total) by nebulization every 4 (four) hours as needed for wheezing or shortness of  breath., Disp: 75 mL, Rfl: 5   b complex vitamins tablet, Take 1 tablet by mouth daily., Disp: 100 tablet, Rfl: 3   Cholecalciferol (VITAMIN D3) 50 MCG (2000 UT) capsule, Take 2 capsules (4,000 Units total) by mouth daily. (Patient taking differently: Take 2,000 Units by mouth daily.), Disp: 100 capsule, Rfl: 3   donepezil (ARICEPT) 5 MG tablet, Take 1 tablet (5 mg total) by mouth at bedtime., Disp: 90 tablet, Rfl: 3   Fluticasone-Umeclidin-Vilant (TRELEGY ELLIPTA) 100-62.5-25 MCG/ACT AEPB, Inhale 1 puff into the lungs daily., Disp: 28 each, Rfl: 11   guaiFENesin (MUCINEX) 600 MG 12 hr tablet, Take 1 tablet (600 mg total) by mouth 2 (two) times daily., Disp: 60 tablet, Rfl: 1   latanoprost (XALATAN) 0.005 % ophthalmic solution, Place 1 drop into the left eye at bedtime., Disp: , Rfl:    Multiple Vitamin (MULTIVITAMIN WITH MINERALS) TABS tablet, Take 1 tablet by mouth daily., Disp: 30 tablet, Rfl: 0   nicotine (NICODERM CQ - DOSED IN MG/24 HOURS) 14 mg/24hr patch, Place 1 patch (14 mg total) onto the skin daily., Disp: 28 patch, Rfl: 0   predniSONE (DELTASONE) 20 MG tablet, Take 1 tablet (20 mg total) by mouth daily with breakfast., Disp: 5 tablet, Rfl: 0

## 2021-12-26 ENCOUNTER — Ambulatory Visit: Payer: Federal, State, Local not specified - PPO | Admitting: Pulmonary Disease

## 2021-12-28 ENCOUNTER — Telehealth: Payer: Self-pay | Admitting: *Deleted

## 2021-12-28 NOTE — Telephone Encounter (Signed)
Transition Care Management Follow-up Telephone Call Date of discharge and from where: Pt states he was not d/c from hospital. He was d/c from [facility].  How have you been since you were released from the hospital? Pt states he is still breathing so I guess ok Any questions or concerns? No  Items Reviewed: Did the pt receive and understand the discharge instructions provided? No  Medications obtained and verified? Yes  Other?  Any new allergies since your discharge? No  Dietary orders reviewed? No Do you have support at home?  Pt states he is during fine  Home Care and Equipment/Supplies: Were home health services ordered? not applicable If so, what is the name of the agency? N/A  Has the agency set up a time to come to the patient's home? not applicable Were any new equipment or medical supplies ordered?  No What is the name of the medical supply agency? N/A Were you able to get the supplies/equipment? not applicable Do you have any questions related to the use of the equipment or supplies? No  Functional Questionnaire: (I = Independent and D = Dependent) ADLs: I  Bathing/Dressing- I  Meal Prep- I  Eating- I  Maintaining continence- I  Transferring/Ambulation- I  Managing Meds- I  Follow up appointments reviewed:  PCP Hospital f/u appt confirmed? Yes  Pt states he is seeing Dr. Alain Marion for a f/u on 01/09/22 Specialist Hospital f/u appt confirmed? No  . Are transportation arrangements needed? No  If their condition worsens, is the pt aware to call PCP or go to the Emergency Dept.? Yes Was the patient provided with contact information for the PCP's office or ED? Yes Was to pt encouraged to call back with questions or concerns? Yes

## 2022-01-09 ENCOUNTER — Encounter: Payer: Self-pay | Admitting: Internal Medicine

## 2022-01-09 ENCOUNTER — Ambulatory Visit: Payer: Federal, State, Local not specified - PPO | Admitting: Internal Medicine

## 2022-01-09 VITALS — BP 128/82 | HR 99 | Temp 98.0°F | Ht 73.0 in | Wt 150.0 lb

## 2022-01-09 DIAGNOSIS — E876 Hypokalemia: Secondary | ICD-10-CM | POA: Diagnosis not present

## 2022-01-09 DIAGNOSIS — Z720 Tobacco use: Secondary | ICD-10-CM

## 2022-01-09 DIAGNOSIS — J431 Panlobular emphysema: Secondary | ICD-10-CM

## 2022-01-09 DIAGNOSIS — R413 Other amnesia: Secondary | ICD-10-CM

## 2022-01-09 DIAGNOSIS — Z23 Encounter for immunization: Secondary | ICD-10-CM | POA: Diagnosis not present

## 2022-01-09 NOTE — Assessment & Plan Note (Addendum)
On O2 2 l/min [place] [facility] Stop smoking! On Trelegy

## 2022-01-09 NOTE — Assessment & Plan Note (Signed)
Off KCl Check labs

## 2022-01-09 NOTE — Assessment & Plan Note (Signed)
Improved On Aricept

## 2022-01-09 NOTE — Addendum Note (Signed)
Addended by: Marijean Heath R on: 01/09/2022 02:59 PM   Modules accepted: Orders

## 2022-01-09 NOTE — Progress Notes (Signed)
Subjective:  Patient ID: Travis Hicks, male    DOB: [birth year]  Age: 71 y.o. MRN: [id number]  CC: Follow-up   HPI Travis Hicks presents for COPD - on O2 ([employer]). F/u on COPD, smoker 3/day  Outpatient Medications Prior to Visit  Medication Sig Dispense Refill   albuterol (PROVENTIL) (2.5 MG/3ML) 0.083% nebulizer solution Take 3 mLs (2.5 mg total) by nebulization every 4 (four) hours as needed for wheezing or shortness of breath. 75 mL 5   b complex vitamins tablet Take 1 tablet by mouth daily. 100 tablet 3   Cholecalciferol (VITAMIN D3) 50 MCG (2000 UT) capsule Take 2 capsules (4,000 Units total) by mouth daily. (Patient taking differently: Take 2,000 Units by mouth daily.) 100 capsule 3   donepezil (ARICEPT) 5 MG tablet Take 1 tablet (5 mg total) by mouth at bedtime. 90 tablet 3   Fluticasone-Umeclidin-Vilant (TRELEGY ELLIPTA) 100-62.5-25 MCG/ACT AEPB Inhale 1 puff into the lungs daily. 28 each 11   guaiFENesin (MUCINEX) 600 MG 12 hr tablet Take 1 tablet (600 mg total) by mouth 2 (two) times daily. 60 tablet 1   ipratropium-albuterol (DUONEB) 0.5-2.5 (3) MG/3ML SOLN Inhale 3 mLs into the lungs every 4 (four) hours as needed.     latanoprost (XALATAN) 0.005 % ophthalmic solution Place 1 drop into the left eye at bedtime.     Multiple Vitamin (MULTIVITAMIN WITH MINERALS) TABS tablet Take 1 tablet by mouth daily. 30 tablet 0   nicotine (NICODERM CQ - DOSED IN MG/24 HOURS) 14 mg/24hr patch Place 1 patch (14 mg total) onto the skin daily. 28 patch 0   predniSONE (DELTASONE) 20 MG tablet Take 1 tablet (20 mg total) by mouth daily with breakfast. 5 tablet 0   No facility-administered medications prior to visit.    ROS: Review of Systems  Objective:  BP 128/82 (BP Location: Left Arm, Patient Position: Sitting, Cuff Size: Normal)   Pulse 99   Temp 98 F (36.7 C) (Oral)   Ht '6\' 1"'$  (1.854 m)   Wt 150 lb (68 kg)   SpO2 98%   BMI 19.79 kg/m   BP Readings from Last 3 Encounters:   01/09/22 128/82  12/22/21 124/82  10/31/21 124/80    Wt Readings from Last 3 Encounters:  01/09/22 150 lb (68 kg)  12/22/21 145 lb 6.4 oz (66 kg)  10/31/21 149 lb 12.8 oz (67.9 kg)    Physical Exam  Lab Results  Component Value Date   WBC 8.1 10/10/2021   HGB 13.4 10/10/2021   HCT 39.7 10/10/2021   PLT 475.0 (H) 10/10/2021   GLUCOSE 92 10/10/2021   CHOL 140 03/05/2020   TRIG 166 (H) 03/05/2020   HDL 73 03/05/2020   LDLCALC 43 03/05/2020   ALT 18 10/10/2021   AST 24 10/10/2021   NA 138 10/10/2021   K 4.9 10/10/2021   CL 99 10/10/2021   CREATININE 0.82 10/10/2021   BUN 11 10/10/2021   CO2 32 10/10/2021   TSH 0.225 (L) 09/27/2021   PSA 0.37 03/05/2020   INR 0.9 09/26/2021    CT Angio Chest PE W and/or Wo Contrast  Result Date: 09/26/2021 CLINICAL DATA:  Pulmonary embolism (PE) suspected, high prob; Abdominal pain, acute, nonlocalized Abd pain Pt via POV sent from Dr office due to low O2 saturation in office at appointment today, being seen for COPD eval. Pt arrives visibly SOB with O2 sat 70% on room air, increased to 80% on 3L nasal cannula. EXAM: CT ANGIOGRAPHY CHEST CT  ABDOMEN AND PELVIS WITH CONTRAST TECHNIQUE: Multidetector CT imaging of the chest was performed using the standard protocol during bolus administration of intravenous contrast. Multiplanar CT image reconstructions and MIPs were obtained to evaluate the vascular anatomy. Multidetector CT imaging of the abdomen and pelvis was performed using the standard protocol during bolus administration of intravenous contrast. RADIATION DOSE REDUCTION: This exam was performed according to the departmental dose-optimization program which includes automated exposure control, adjustment of the mA and/or kV according to patient size and/or use of iterative reconstruction technique. CONTRAST:  164m OMNIPAQUE IOHEXOL 350 MG/ML SOLN COMPARISON:  None Available. FINDINGS: CTA CHEST FINDINGS Cardiovascular: Satisfactory opacification  of the pulmonary arteries to the segmental level. No central or segmental evidence of pulmonary embolism. Limited evaluation of the subsegmental level due to timing of contrast. The main pulmonary artery measures at the upper limits of normal. Normal heart size. No significant pericardial effusion. The thoracic aorta is normal in caliber. Mild atherosclerotic plaque of the thoracic aorta. No coronary artery calcifications. Mediastinum/Nodes: Enlarged right hilar lymph node measuring up to 1.7 cm. Prominent left hilar lymph nodes. Calcifications along the right left hilar lymph nodes may be sequelae of prior granulomatous disease. No enlarged mediastinal or axillary lymph nodes. Thyroid gland, trachea, and esophagus demonstrate no significant findings. Lungs/Pleura: Emphysematous changes. Diffuse bronchial wall thickening. Peribronchovascular tree-in-bud nodularity most prominent in the upper lung zones. Linear atelectasis versus scarring within the right middle lobe. No pulmonary nodule. No pulmonary mass. No pleural effusion. No pneumothorax. Musculoskeletal: No chest wall abnormality. No suspicious lytic or blastic osseous lesions. No acute displaced fracture. Multilevel degenerative changes of the spine. Review of the MIP images confirms the above findings. CT ABDOMEN and PELVIS FINDINGS Hepatobiliary: No focal liver abnormality. The gallbladder is contracted. No gallstones, gallbladder wall thickening, or pericholecystic fluid. No biliary dilatation. Pancreas: No focal lesion. Normal pancreatic contour. No surrounding inflammatory changes. No main pancreatic ductal dilatation. Spleen: Normal in size without focal abnormality. Adrenals/Urinary Tract: No adrenal nodule bilaterally. Bilateral kidneys enhance symmetrically. A left renal 1.4 cm fluid density lesion likely represents a simple renal cyst. Simple renal cysts, in the absence of clinically indicated signs/symptoms, require no independent follow-up. No  hydronephrosis. No hydroureter. The urinary bladder is unremarkable. Stomach/Bowel: Stomach is within normal limits. No evidence of bowel wall thickening or dilatation. Scattered colonic diverticulosis appendix appears normal. Vascular/Lymphatic: No abdominal aorta or iliac aneurysm. Severe atherosclerotic plaque of the aorta and its branches. No abdominal, pelvic, or inguinal lymphadenopathy. Reproductive: Prostate is unremarkable. Other: No intraperitoneal free fluid. No intraperitoneal free gas. No organized fluid collection. Musculoskeletal: No abdominal wall hernia or abnormality. 3.7 cm fat density lesion within the right gluteal musculature likely represents lipomatous lesion. No suspicious lytic or blastic osseous lesions. No acute displaced fracture. Grade 1 anterolisthesis of L4 on L5. Review of the MIP images confirms the above findings. IMPRESSION: 1. No central or segmental pulmonary embolus. Limited evaluation of the subsegmental level due to timing of contrast. 2. Pulmonary findings suggestive of an atypical infection. No follow-up needed if patient is low-risk (and has no known or suspected primary neoplasm). Non-contrast chest CT can be considered in 12 months if patient is high-risk. This recommendation follows the consensus statement: Guidelines for Management of Incidental Pulmonary Nodules Detected on CT Images: From the Fleischner Society 2017; Radiology 2017; 284:228-243. 3. Right middle lobe linear atelectasis versus scarring. Developing infection not excluded. 4. Right hilar lymphadenopathy may be reactive in etiology. Recommend attention on follow-up. 5. Colonic diverticulosis  with no acute diverticulitis. 6.  Aortic Atherosclerosis (ICD10-I70.0). Electronically Signed   By: Iven Finn M.D.   On: 09/26/2021 19:25   CT ABDOMEN PELVIS W CONTRAST  Result Date: 09/26/2021 CLINICAL DATA:  Pulmonary embolism (PE) suspected, high prob; Abdominal pain, acute, nonlocalized Abd pain Pt via  POV sent from Dr office due to low O2 saturation in office at appointment today, being seen for COPD eval. Pt arrives visibly SOB with O2 sat 70% on room air, increased to 80% on 3L nasal cannula. EXAM: CT ANGIOGRAPHY CHEST CT ABDOMEN AND PELVIS WITH CONTRAST TECHNIQUE: Multidetector CT imaging of the chest was performed using the standard protocol during bolus administration of intravenous contrast. Multiplanar CT image reconstructions and MIPs were obtained to evaluate the vascular anatomy. Multidetector CT imaging of the abdomen and pelvis was performed using the standard protocol during bolus administration of intravenous contrast. RADIATION DOSE REDUCTION: This exam was performed according to the departmental dose-optimization program which includes automated exposure control, adjustment of the mA and/or kV according to patient size and/or use of iterative reconstruction technique. CONTRAST:  149m OMNIPAQUE IOHEXOL 350 MG/ML SOLN COMPARISON:  None Available. FINDINGS: CTA CHEST FINDINGS Cardiovascular: Satisfactory opacification of the pulmonary arteries to the segmental level. No central or segmental evidence of pulmonary embolism. Limited evaluation of the subsegmental level due to timing of contrast. The main pulmonary artery measures at the upper limits of normal. Normal heart size. No significant pericardial effusion. The thoracic aorta is normal in caliber. Mild atherosclerotic plaque of the thoracic aorta. No coronary artery calcifications. Mediastinum/Nodes: Enlarged right hilar lymph node measuring up to 1.7 cm. Prominent left hilar lymph nodes. Calcifications along the right left hilar lymph nodes may be sequelae of prior granulomatous disease. No enlarged mediastinal or axillary lymph nodes. Thyroid gland, trachea, and esophagus demonstrate no significant findings. Lungs/Pleura: Emphysematous changes. Diffuse bronchial wall thickening. Peribronchovascular tree-in-bud nodularity most prominent in the  upper lung zones. Linear atelectasis versus scarring within the right middle lobe. No pulmonary nodule. No pulmonary mass. No pleural effusion. No pneumothorax. Musculoskeletal: No chest wall abnormality. No suspicious lytic or blastic osseous lesions. No acute displaced fracture. Multilevel degenerative changes of the spine. Review of the MIP images confirms the above findings. CT ABDOMEN and PELVIS FINDINGS Hepatobiliary: No focal liver abnormality. The gallbladder is contracted. No gallstones, gallbladder wall thickening, or pericholecystic fluid. No biliary dilatation. Pancreas: No focal lesion. Normal pancreatic contour. No surrounding inflammatory changes. No main pancreatic ductal dilatation. Spleen: Normal in size without focal abnormality. Adrenals/Urinary Tract: No adrenal nodule bilaterally. Bilateral kidneys enhance symmetrically. A left renal 1.4 cm fluid density lesion likely represents a simple renal cyst. Simple renal cysts, in the absence of clinically indicated signs/symptoms, require no independent follow-up. No hydronephrosis. No hydroureter. The urinary bladder is unremarkable. Stomach/Bowel: Stomach is within normal limits. No evidence of bowel wall thickening or dilatation. Scattered colonic diverticulosis appendix appears normal. Vascular/Lymphatic: No abdominal aorta or iliac aneurysm. Severe atherosclerotic plaque of the aorta and its branches. No abdominal, pelvic, or inguinal lymphadenopathy. Reproductive: Prostate is unremarkable. Other: No intraperitoneal free fluid. No intraperitoneal free gas. No organized fluid collection. Musculoskeletal: No abdominal wall hernia or abnormality. 3.7 cm fat density lesion within the right gluteal musculature likely represents lipomatous lesion. No suspicious lytic or blastic osseous lesions. No acute displaced fracture. Grade 1 anterolisthesis of L4 on L5. Review of the MIP images confirms the above findings. IMPRESSION: 1. No central or segmental  pulmonary embolus. Limited evaluation of the subsegmental  level due to timing of contrast. 2. Pulmonary findings suggestive of an atypical infection. No follow-up needed if patient is low-risk (and has no known or suspected primary neoplasm). Non-contrast chest CT can be considered in 12 months if patient is high-risk. This recommendation follows the consensus statement: Guidelines for Management of Incidental Pulmonary Nodules Detected on CT Images: From the Fleischner Society 2017; Radiology 2017; 284:228-243. 3. Right middle lobe linear atelectasis versus scarring. Developing infection not excluded. 4. Right hilar lymphadenopathy may be reactive in etiology. Recommend attention on follow-up. 5. Colonic diverticulosis with no acute diverticulitis. 6.  Aortic Atherosclerosis (ICD10-I70.0). Electronically Signed   By: Iven Finn M.D.   On: 09/26/2021 19:25   DG Chest Portable 1 View  Result Date: 09/26/2021 CLINICAL DATA:  Shortness of breath. EXAM: PORTABLE CHEST 1 VIEW COMPARISON:  Chest x-ray dated January 28, 2021. FINDINGS: The heart size and mediastinal contours are within normal limits. Normal pulmonary vascularity. Hazy airspace density in the right upper lobe. No pleural effusion or pneumothorax. No acute osseous abnormality. IMPRESSION: 1. Right upper lobe pneumonia. Electronically Signed   By: Titus Dubin M.D.   On: 09/26/2021 16:04    Assessment & Plan:   Problem List Items Addressed This Visit     Tobacco abuse    Try ON! 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 On Trelegy      Relevant Medications   ipratropium-albuterol (DUONEB) 0.5-2.5 (3) MG/3ML SOLN   Memory loss    Improved On Aricept      Hypokalemia    Off KCl Check labs      Relevant Orders   Comprehensive metabolic panel      No orders of the defined types were placed in this encounter.     Follow-up: Return in about 3 months (around  04/11/2022) for a follow-up visit.  Walker Kehr, MD

## 2022-01-09 NOTE — Assessment & Plan Note (Signed)
Try ON! In place of cigs

## 2022-01-10 ENCOUNTER — Other Ambulatory Visit: Payer: Self-pay | Admitting: Internal Medicine

## 2022-01-10 LAB — COMPREHENSIVE METABOLIC PANEL
ALT: 15 U/L (ref 0–53)
AST: 25 U/L (ref 0–37)
Albumin: 3.9 g/dL (ref 3.5–5.2)
Alkaline Phosphatase: 52 U/L (ref 39–117)
BUN: 5 mg/dL — ABNORMAL LOW (ref 6–23)
CO2: 31 mEq/L (ref 19–32)
Calcium: 9.5 mg/dL (ref 8.4–10.5)
Chloride: 100 mEq/L (ref 96–112)
Creatinine, Ser: 0.67 mg/dL (ref 0.40–1.50)
GFR: 93.96 mL/min (ref >60.00)
Glucose, Bld: 112 mg/dL — ABNORMAL HIGH (ref 70–99)
Potassium: 3.4 mEq/L — ABNORMAL LOW (ref 3.5–5.1)
Sodium: 140 mEq/L (ref 135–145)
Total Bilirubin: 0.7 mg/dL (ref 0.2–1.2)
Total Protein: 6.5 g/dL (ref 6.0–8.3)

## 2022-01-10 MED ORDER — POTASSIUM CHLORIDE CRYS ER 20 MEQ PO TBCR: 20.0000 meq | EXTENDED_RELEASE_TABLET | Freq: Every day | ORAL | 0 refills | Status: DC

## 2022-01-31 ENCOUNTER — Ambulatory Visit: Payer: Federal, State, Local not specified - PPO | Admitting: Pulmonary Disease

## 2022-01-31 ENCOUNTER — Encounter: Payer: Self-pay | Admitting: Pulmonary Disease

## 2022-01-31 VITALS — BP 128/84 | HR 121 | Ht 73.0 in | Wt 151.0 lb

## 2022-01-31 DIAGNOSIS — F17210 Nicotine dependence, cigarettes, uncomplicated: Secondary | ICD-10-CM

## 2022-01-31 DIAGNOSIS — J9611 Chronic respiratory failure with hypoxia: Secondary | ICD-10-CM | POA: Diagnosis not present

## 2022-01-31 DIAGNOSIS — J432 Centrilobular emphysema: Secondary | ICD-10-CM | POA: Diagnosis not present

## 2022-01-31 NOTE — Progress Notes (Signed)
Synopsis: Referred in August 2023 for COPD by Lew Dawes, MD  Subjective:   PATIENT ID: Travis Hicks GENDER: male DOB: 23-Aug-1950, MRN: [id number]  HPI  Chief Complaint  Patient presents with   Follow-up    1 mo f/u. States he still has a productive cough with yellow phlegm.    Brewster Wolters is a 71 year old male, daily smoker with COPD/emphysema who returns to pulmonary clinic for COPD.  He saw Dr. Lake Bells 12/22/21 for cough. Sputum were negative from that visit. He continues to have cough. It is not keeping him up at night. He denies wheezing. He continues to smoke 3-4 cigarettes per day.   Initial OV 10/20/21 Patient was recently admitted 8/8 to 8/12 for community acquired pneumonia and acute hypoxemic respiratory failure. He was discharged on arnuity and anoro inhaler therapy. He was on anoro therapy prior to admission.   Since discharge he has only been using arnuity ellipta daily as his pharmacy said he did not need both inhalers.   He reports his breathing has not improved much since discharge. He has productive cough. He was discharged on supplemental oxygen and is using 3L day/night. He has run out of albuterol nebulizer solution. Prior to admission he was able to walk to his mail box and back but now he struggles to do that.  He is a retired [employer]. He smokes 8 cigs per day. He was smoking 1 pack per day and has been smoking for 20+ years.  Past Medical History:  Diagnosis Date   Cataract    veru small per pt .   Emphysema of lung ([place])    Glaucoma 2018   Overactive bladder      Family History  Problem Relation Age of Onset   Pancreatic cancer Father    Glaucoma Brother    Colon cancer Neg Hx    Rectal cancer Neg Hx    Stomach cancer Neg Hx    Esophageal cancer Neg Hx    Prostate cancer Neg Hx    Colon polyps Neg Hx      Social History   Socioeconomic History   Marital status: Married    Spouse name: Not on file   Number of children: 3    Years of education: Not on file   Highest education level: Not on file  Occupational History   Occupation: retired  Tobacco Use   Smoking status: Every Day    Packs/day: 1.00    Types: Cigarettes   Smokeless tobacco: Never   Tobacco comments:    3 cigs a day. 10/31/21.  Vaping Use   Vaping Use: Never used  Substance and Sexual Activity   Alcohol use: Not Currently   Drug use: No   Sexual activity: Yes  Other Topics Concern   Not on file  Social History Narrative   Not on file   Social Determinants of Health   Financial Resource Strain: Not on file  Food Insecurity: Not on file  Transportation Needs: Not on file  Physical Activity: Not on file  Stress: Not on file  Social Connections: Not on file  Intimate Partner Violence: Not on file     Allergies  Allergen Reactions   Viagra [Sildenafil Citrate] Other (See Comments)    Unknown per Pt     Outpatient Medications Prior to Visit  Medication Sig Dispense Refill   albuterol (PROVENTIL) (2.5 MG/3ML) 0.083% nebulizer solution Take 3 mLs (2.5 mg total) by nebulization every 4 (four) hours  as needed for wheezing or shortness of breath. 75 mL 5   b complex vitamins tablet Take 1 tablet by mouth daily. 100 tablet 3   Cholecalciferol (VITAMIN D3) 50 MCG (2000 UT) capsule Take 2 capsules (4,000 Units total) by mouth daily. (Patient taking differently: Take 2,000 Units by mouth daily.) 100 capsule 3   donepezil (ARICEPT) 5 MG tablet Take 1 tablet (5 mg total) by mouth at bedtime. 90 tablet 3   Fluticasone-Umeclidin-Vilant (TRELEGY ELLIPTA) 100-62.5-25 MCG/ACT AEPB Inhale 1 puff into the lungs daily. 28 each 11   guaiFENesin (MUCINEX) 600 MG 12 hr tablet Take 1 tablet (600 mg total) by mouth 2 (two) times daily. 60 tablet 1   ipratropium-albuterol (DUONEB) 0.5-2.5 (3) MG/3ML SOLN Inhale 3 mLs into the lungs every 4 (four) hours as needed.     latanoprost (XALATAN) 0.005 % ophthalmic solution Place 1 drop into the left eye at  bedtime.     Multiple Vitamin (MULTIVITAMIN WITH MINERALS) TABS tablet Take 1 tablet by mouth daily. 30 tablet 0   nicotine (NICODERM CQ - DOSED IN MG/24 HOURS) 14 mg/24hr patch Place 1 patch (14 mg total) onto the skin daily. 28 patch 0   potassium chloride SA (KLOR-CON M) 20 MEQ tablet Take 1 tablet (20 mEq total) by mouth daily. 10 tablet 0   predniSONE (DELTASONE) 20 MG tablet Take 1 tablet (20 mg total) by mouth daily with breakfast. 5 tablet 0   No facility-administered medications prior to visit.    Review of Systems  Constitutional:  Negative for chills, fever, malaise/fatigue and weight loss.  HENT:  Negative for congestion, sinus pain and sore throat.   Eyes: Negative.   Respiratory:  Positive for cough and sputum production. Negative for hemoptysis, shortness of breath and wheezing.   Cardiovascular:  Negative for chest pain, palpitations, orthopnea, claudication and leg swelling.  Gastrointestinal:  Negative for abdominal pain, heartburn, nausea and vomiting.  Genitourinary: Negative.   Musculoskeletal:  Negative for joint pain and myalgias.  Skin:  Negative for rash.  Neurological:  Negative for weakness.  Endo/Heme/Allergies: Negative.   Psychiatric/Behavioral: Negative.        Objective:   Vitals:   01/31/22 1457  BP: 128/84  Pulse: (!) 121  SpO2: 97%  Weight: 151 lb (68.5 kg)  Height: '6\' 1"'$  (1.854 m)     Physical Exam Constitutional:      General: He is not in acute distress. HENT:     Head: Normocephalic and atraumatic.  Eyes:     Conjunctiva/sclera: Conjunctivae normal.  Cardiovascular:     Rate and Rhythm: Normal rate and regular rhythm.     Pulses: Normal pulses.     Heart sounds: Normal heart sounds. No murmur heard. Pulmonary:     Effort: Pulmonary effort is normal.     Breath sounds: Decreased air movement present. No wheezing, rhonchi or rales.  Musculoskeletal:     Right lower leg: No edema.     Left lower leg: No edema.  Skin:     General: Skin is warm and dry.  Neurological:     General: No focal deficit present.     Mental Status: He is alert.    CBC    Component Value Date/Time   WBC 8.1 10/10/2021 1639   RBC 4.00 (L) 10/10/2021 1639   HGB 13.4 10/10/2021 1639   HCT 39.7 10/10/2021 1639   PLT 475.0 (H) 10/10/2021 1639   MCV 99.1 10/10/2021 1639   MCH 33.2 09/30/2021  0514   MCHC 33.8 10/10/2021 1639   RDW 15.0 10/10/2021 1639   LYMPHSABS 1.1 10/10/2021 1639   MONOABS 0.6 10/10/2021 1639   EOSABS 0.1 10/10/2021 1639   BASOSABS 0.1 10/10/2021 1639      Latest Ref Rng & Units 01/09/2022    3:03 PM 10/10/2021    4:39 PM 10/01/2021    8:42 AM  BMP  Glucose 70 - 99 mg/dL 112  92  134   BUN 6 - 23 mg/dL '5  11  8   '$ Creatinine 0.40 - 1.50 mg/dL 0.67  0.82  0.78   Sodium 135 - 145 mEq/L 140  138  141   Potassium 3.5 - 5.1 mEq/L 3.4  4.9  3.7   Chloride 96 - 112 mEq/L 100  99  96   CO2 19 - 32 mEq/L 31  32  38   Calcium 8.4 - 10.5 mg/dL 9.5  9.8  8.9    Chest imaging: CT Chest 09/26/21 Mediastinum/Nodes: Enlarged right hilar lymph node measuring up to 1.7 cm. Prominent left hilar lymph nodes. Calcifications along the right left hilar lymph nodes may be sequelae of prior granulomatous disease. No enlarged mediastinal or axillary lymph nodes. Thyroid gland, trachea, and esophagus demonstrate no significant findings.   Lungs/Pleura: Emphysematous changes. Diffuse bronchial wall thickening. Peribronchovascular tree-in-bud nodularity most prominent in the upper lung zones. Linear atelectasis versus scarring within the right middle lobe. No pulmonary nodule. No pulmonary mass. No pleural effusion. No pneumothorax.  PFT:    Latest Ref Rng & Units 12/14/2021    2:57 PM  PFT Results  FVC-Pre L 3.38   FVC-Predicted Pre % 71   FVC-Post L 3.15   FVC-Predicted Post % 66   Pre FEV1/FVC % % 45   Post FEV1/FCV % % 42   FEV1-Pre L 1.53   FEV1-Predicted Pre % 43   FEV1-Post L 1.34   DLCO uncorrected  ml/min/mmHg 14.44   DLCO UNC% % 52   DLCO corrected ml/min/mmHg 14.44   DLCO COR %Predicted % 52   DLVA Predicted % 59   TLC L 9.46   TLC % Predicted % 127   RV % Predicted % 230     Labs:  Path:  Echo:  Heart Catheterization:  Assessment & Plan:   Centrilobular emphysema (HCC)  Chronic respiratory failure with hypoxia (HCC)  Cigarette smoker  Discussion: Travis Hicks is a 71 year old male, daily smoker with COPD/emphysema who is referred to pulmonary clinic for hospital follow up.   He has centrilobular emphysema based on CT Chest scan.   He is to continue trelegy ellipta 1 puff daily. He is to use nebulizer treatment twice daily followed by flutter valve for pulmonary hygiene.   We will refer to our lung cancer screening program, check a 6MWT and over night oximetry on room air.   Follow up in 3 months.  Freda Jackson, MD [facility] Office: [phone]    Current Outpatient Medications:    albuterol (PROVENTIL) (2.5 MG/3ML) 0.083% nebulizer solution, Take 3 mLs (2.5 mg total) by nebulization every 4 (four) hours as needed for wheezing or shortness of breath., Disp: 75 mL, Rfl: 5   b complex vitamins tablet, Take 1 tablet by mouth daily., Disp: 100 tablet, Rfl: 3   Cholecalciferol (VITAMIN D3) 50 MCG (2000 UT) capsule, Take 2 capsules (4,000 Units total) by mouth daily. (Patient taking differently: Take 2,000 Units by mouth daily.), Disp: 100 capsule, Rfl: 3  donepezil (ARICEPT) 5 MG tablet, Take 1 tablet (5 mg total) by mouth at bedtime., Disp: 90 tablet, Rfl: 3   Fluticasone-Umeclidin-Vilant (TRELEGY ELLIPTA) 100-62.5-25 MCG/ACT AEPB, Inhale 1 puff into the lungs daily., Disp: 28 each, Rfl: 11   guaiFENesin (MUCINEX) 600 MG 12 hr tablet, Take 1 tablet (600 mg total) by mouth 2 (two) times daily., Disp: 60 tablet, Rfl: 1   ipratropium-albuterol (DUONEB) 0.5-2.5 (3) MG/3ML SOLN, Inhale 3 mLs into the lungs every 4 (four) hours as needed.,  Disp: , Rfl:    latanoprost (XALATAN) 0.005 % ophthalmic solution, Place 1 drop into the left eye at bedtime., Disp: , Rfl:    Multiple Vitamin (MULTIVITAMIN WITH MINERALS) TABS tablet, Take 1 tablet by mouth daily., Disp: 30 tablet, Rfl: 0   nicotine (NICODERM CQ - DOSED IN MG/24 HOURS) 14 mg/24hr patch, Place 1 patch (14 mg total) onto the skin daily., Disp: 28 patch, Rfl: 0   potassium chloride SA (KLOR-CON M) 20 MEQ tablet, Take 1 tablet (20 mEq total) by mouth daily., Disp: 10 tablet, Rfl: 0

## 2022-01-31 NOTE — Patient Instructions (Addendum)
Use Nebulizer treatment twice daily followed by flutter valve for pulmonary hygiene.   Continue to use trelegy ellipta 1 puff daily - rinse mouth out after each use  We will refer you to our lung cancer screening program.  We will check O2 levels on room air at rest and with walking around to determine if you need oxygen. If everything is normal, then we will schedule you for 6 minute walk test.  We will schedule you for overnight oxygen testing on room air.  Follow up in 3 months.

## 2022-02-04 ENCOUNTER — Encounter: Payer: Self-pay | Admitting: Pulmonary Disease

## 2022-02-06 LAB — MYCOBACTERIA,CULT W/FLUOROCHROME SMEAR
MICRO NUMBER:: 14139863
SMEAR:: NONE SEEN
SPECIMEN QUALITY:: ADEQUATE

## 2022-02-06 LAB — RESPIRATORY CULTURE OR RESPIRATORY AND SPUTUM CULTURE
MICRO NUMBER:: 14139864
RESULT:: NORMAL
SPECIMEN QUALITY:: ADEQUATE

## 2022-02-16 ENCOUNTER — Telehealth: Payer: Self-pay | Admitting: Pulmonary Disease

## 2022-02-16 NOTE — Telephone Encounter (Signed)
Received ONO results from [employer]. Will place result in JD's folder for review.

## 2022-02-17 NOTE — Telephone Encounter (Signed)
Called and spoke with patient and told him that we received the ONO results from [place] yesterday and that we are waiting for Dr Erin Fulling to look at results and to let us know his recommendations. He voiced understanding. I told him someone will call him back with his results. Nothing further needed

## 2022-02-21 NOTE — Telephone Encounter (Signed)
Called and spoke with patient. He verbalized understanding of results. He stated that he already has a concentrator at home and it is already set at 2L.   While on the phone, he stated that he had not heard anything about his POC. He had walked twice in our office and did not qualify for the POC.   I advised him that I would send a message to [employer] to see what his options are for the POC. He verbalized understanding.   Community message has been sent to [employer] to check on the POC order that was placed back in October.

## 2022-02-21 NOTE — Telephone Encounter (Signed)
Patient spent 5hr 68mn with SpO2 less than 88%.   Recommend patient use 2L of oxygen with humidification at night when sleeping based on his ONO results. Please send DME orders if he is ok with starting oxygen therapy.   Thanks, JD

## 2022-03-02 ENCOUNTER — Telehealth: Payer: Self-pay | Admitting: Pulmonary Disease

## 2022-03-02 NOTE — Telephone Encounter (Signed)
Called and spoke with pt and went over the ONO results with him again.nothing further needed.

## 2022-03-15 IMAGING — CR DG CHEST 2V
3 series · 3 of 3 positions shown · non-contrast
Comparison: Radiograph 11/21/2020

CLINICAL DATA: Shortness of breath

EXAM:
CHEST - 2 VIEW

[w chest pa]
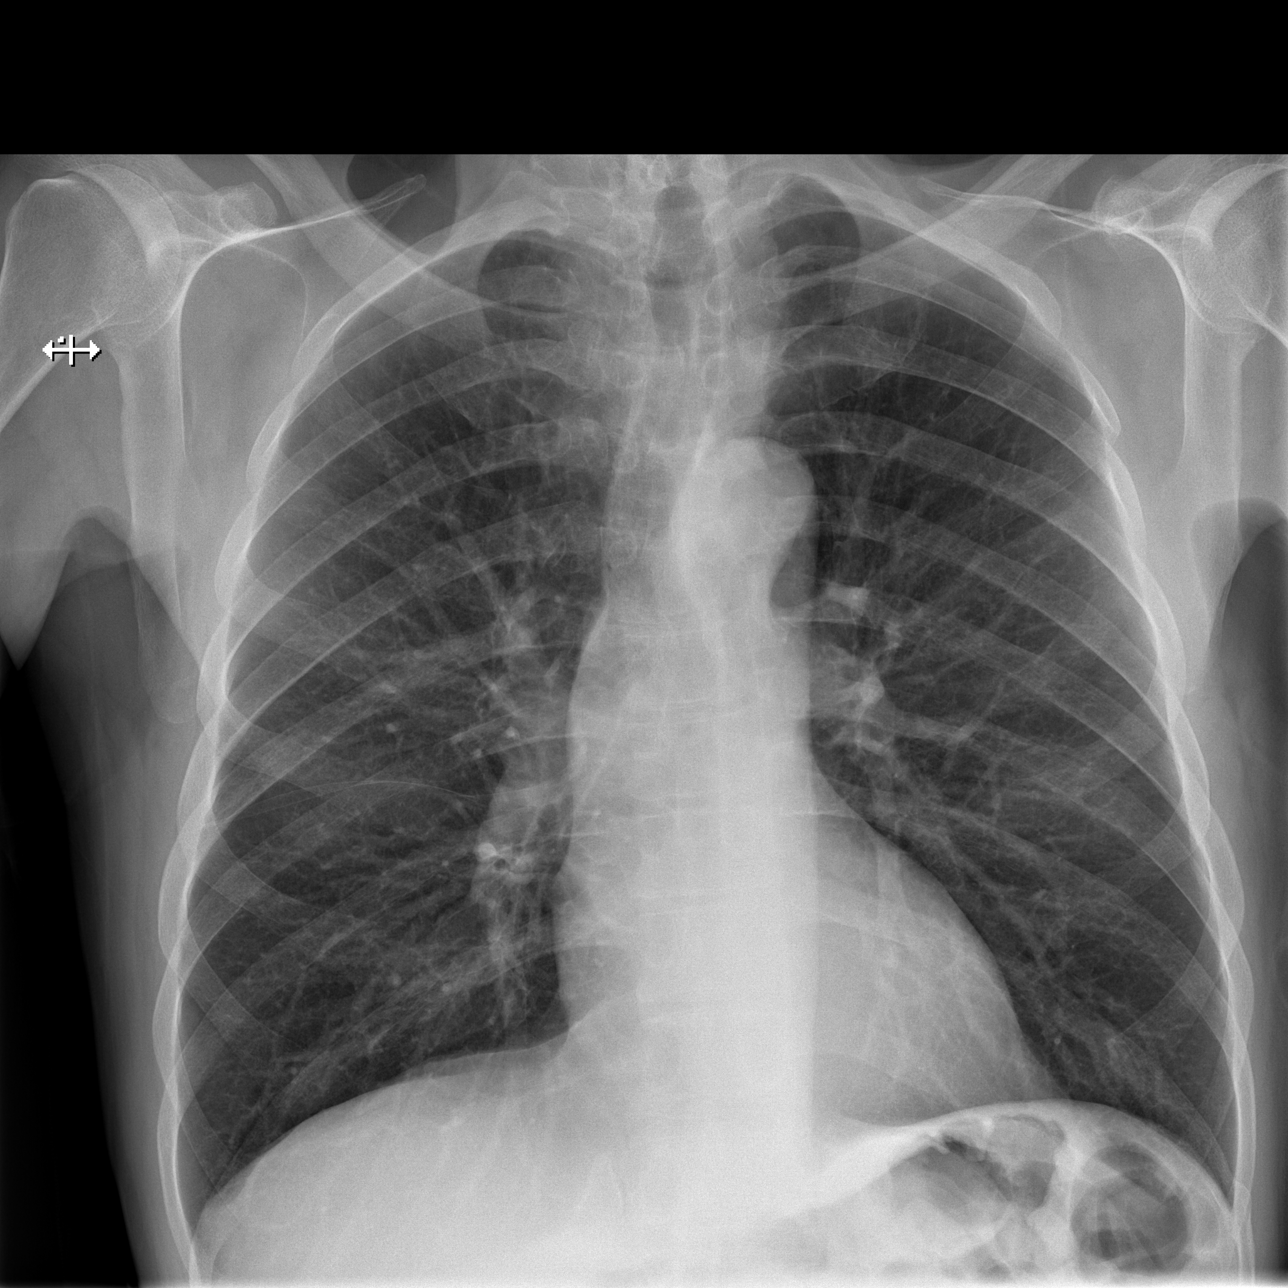

[w chest lat (1 of 2)]
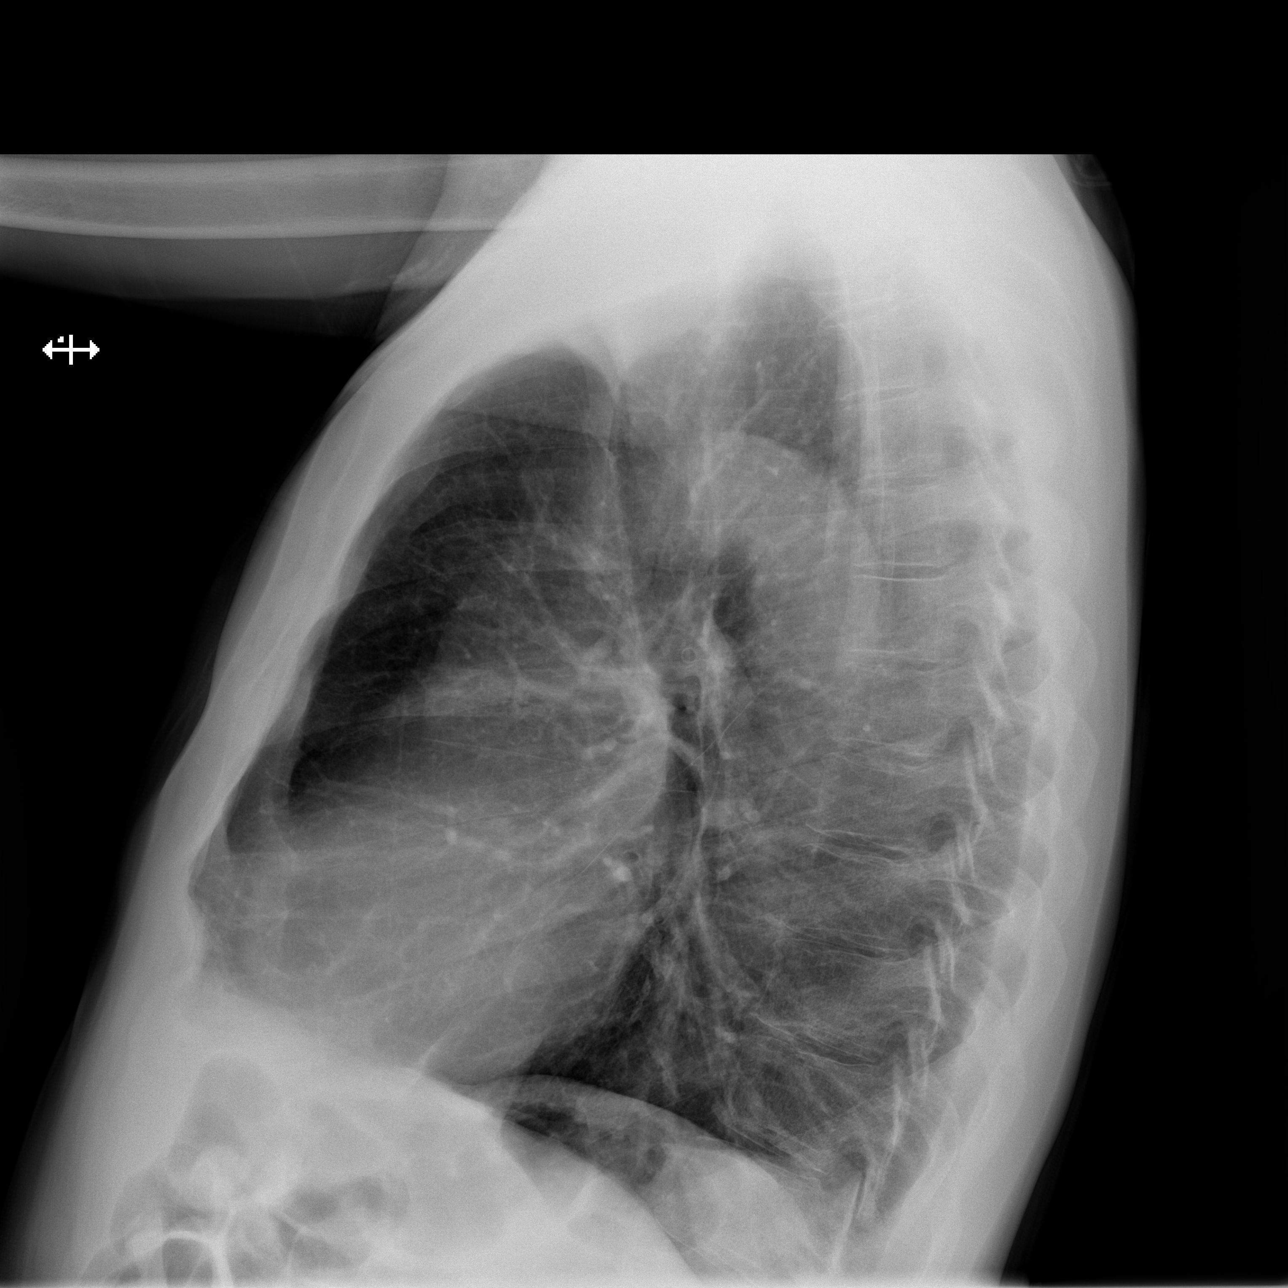

[w chest lat (2 of 2)]
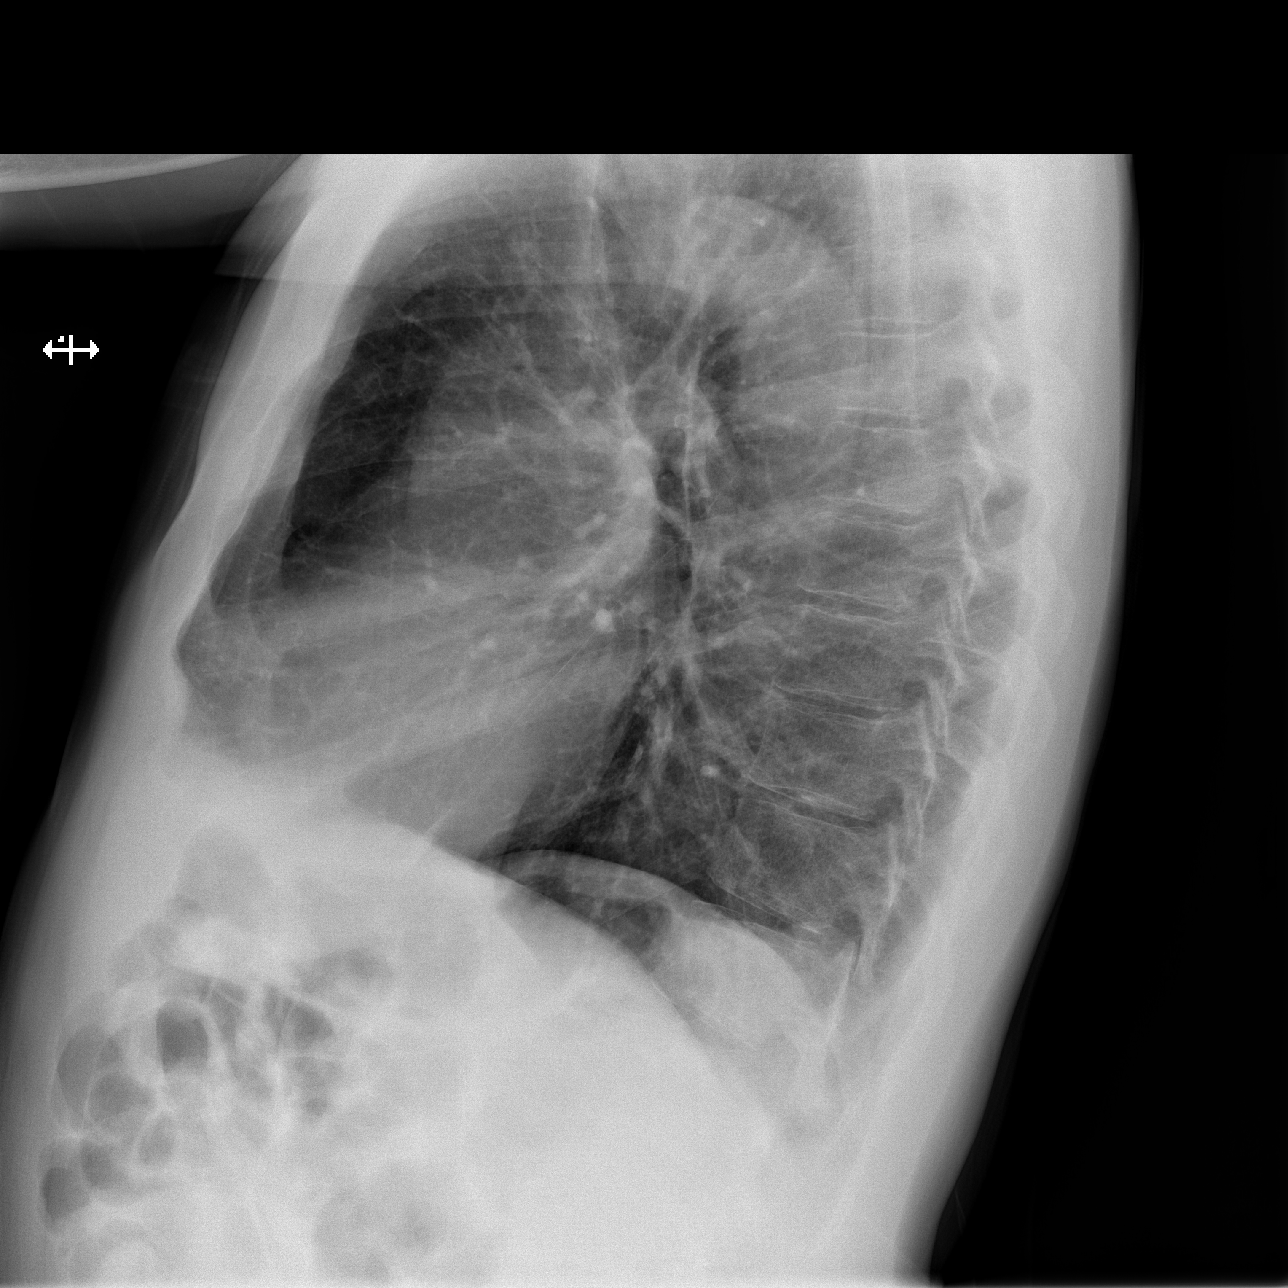

[3 of 3 positions shown; findings below may reference images not displayed]

FINDINGS: Unchanged cardiomediastinal silhouette. There is no focal airspace
disease. There is no large pleural effusion. There is no visible
pneumothorax. There is no acute osseous abnormality. Thoracic
spondylosis.
IMPRESSION: No evidence of acute cardiopulmonary disease.

## 2022-04-27 ENCOUNTER — Encounter: Payer: Self-pay | Admitting: Pulmonary Disease

## 2022-04-27 ENCOUNTER — Ambulatory Visit (INDEPENDENT_AMBULATORY_CARE_PROVIDER_SITE_OTHER): Payer: Federal, State, Local not specified - PPO | Admitting: Pulmonary Disease

## 2022-04-27 VITALS — BP 120/72 | HR 90 | Ht 73.0 in | Wt 150.0 lb

## 2022-04-27 DIAGNOSIS — J432 Centrilobular emphysema: Secondary | ICD-10-CM

## 2022-04-27 DIAGNOSIS — G4734 Idiopathic sleep related nonobstructive alveolar hypoventilation: Secondary | ICD-10-CM

## 2022-04-27 NOTE — Progress Notes (Signed)
Synopsis: Referred in August 2023 for COPD by Lew Dawes, MD  Subjective:   PATIENT ID: Travis Hicks GENDER: male DOB: 09/19/1950, MRN: [id number]  HPI  Chief Complaint  Patient presents with   Follow-up    2 mo f/u. States he has not been using his oxygen as much due to him not needing it lately.    Travis Hicks is a 72 year old male, daily smoker with COPD/emphysema who returns to pulmonary clinic for COPD.  He has been doing ok since last visit. ONO showed he had 5+ hours with SpO2 less than 88%. He was instructed to use 2L O2 at night. We reviewed the importance of this. He expressed concern with not being able to travel. He continues to have productive cough. He stopped smoking 1 week ago and is using nicotine pouches.   OV 01/31/22 He saw Dr. Lake Bells 12/22/21 for cough. Sputum were negative from that visit. He continues to have cough. It is not keeping him up at night. He denies wheezing. He continues to smoke 3-4 cigarettes per day.   Initial OV 10/20/21 Patient was recently admitted 8/8 to 8/12 for community acquired pneumonia and acute hypoxemic respiratory failure. He was discharged on arnuity and anoro inhaler therapy. He was on anoro therapy prior to admission.   Since discharge he has only been using arnuity ellipta daily as his pharmacy said he did not need both inhalers.   He reports his breathing has not improved much since discharge. He has productive cough. He was discharged on supplemental oxygen and is using 3L day/night. He has run out of albuterol nebulizer solution. Prior to admission he was able to walk to his mail box and back but now he struggles to do that.  He is a retired [employer]. He smokes 8 cigs per day. He was smoking 1 pack per day and has been smoking for 20+ years.  Past Medical History:  Diagnosis Date   Cataract    veru small per pt .   Emphysema of lung ([place])    Glaucoma 2018   Overactive bladder      Family History  Problem  Relation Age of Onset   Pancreatic cancer Father    Glaucoma Brother    Colon cancer Neg Hx    Rectal cancer Neg Hx    Stomach cancer Neg Hx    Esophageal cancer Neg Hx    Prostate cancer Neg Hx    Colon polyps Neg Hx      Social History   Socioeconomic History   Marital status: Married    Spouse name: Not on file   Number of children: 3   Years of education: Not on file   Highest education level: Not on file  Occupational History   Occupation: retired  Tobacco Use   Smoking status: Former    Packs/day: 1.00    Types: Cigarettes    Quit date: 04/21/2022    Years since quitting: 0.0   Smokeless tobacco: Never   Tobacco comments:    3 cigs a day. 10/31/21.  Vaping Use   Vaping Use: Never used  Substance and Sexual Activity   Alcohol use: Not Currently   Drug use: No   Sexual activity: Yes  Other Topics Concern   Not on file  Social History Narrative   Not on file   Social Determinants of Health   Financial Resource Strain: Not on file  Food Insecurity: Not on file  Transportation Needs: Not  on file  Physical Activity: Not on file  Stress: Not on file  Social Connections: Not on file  Intimate Partner Violence: Not on file     Allergies  Allergen Reactions   Viagra [Sildenafil Citrate] Other (See Comments)    Unknown per Pt     Outpatient Medications Prior to Visit  Medication Sig Dispense Refill   albuterol (PROVENTIL) (2.5 MG/3ML) 0.083% nebulizer solution Take 3 mLs (2.5 mg total) by nebulization every 4 (four) hours as needed for wheezing or shortness of breath. 75 mL 5   b complex vitamins tablet Take 1 tablet by mouth daily. 100 tablet 3   Cholecalciferol (VITAMIN D3) 50 MCG (2000 UT) capsule Take 2 capsules (4,000 Units total) by mouth daily. (Patient taking differently: Take 2,000 Units by mouth daily.) 100 capsule 3   donepezil (ARICEPT) 5 MG tablet Take 1 tablet (5 mg total) by mouth at bedtime. 90 tablet 3   Fluticasone-Umeclidin-Vilant (TRELEGY  ELLIPTA) 100-62.5-25 MCG/ACT AEPB Inhale 1 puff into the lungs daily. 28 each 11   guaiFENesin (MUCINEX) 600 MG 12 hr tablet Take 1 tablet (600 mg total) by mouth 2 (two) times daily. 60 tablet 1   ipratropium-albuterol (DUONEB) 0.5-2.5 (3) MG/3ML SOLN Inhale 3 mLs into the lungs every 4 (four) hours as needed.     latanoprost (XALATAN) 0.005 % ophthalmic solution Place 1 drop into the left eye at bedtime.     Multiple Vitamin (MULTIVITAMIN WITH MINERALS) TABS tablet Take 1 tablet by mouth daily. 30 tablet 0   nicotine (NICODERM CQ - DOSED IN MG/24 HOURS) 14 mg/24hr patch Place 1 patch (14 mg total) onto the skin daily. 28 patch 0   potassium chloride SA (KLOR-CON M) 20 MEQ tablet Take 1 tablet (20 mEq total) by mouth daily. 10 tablet 0   No facility-administered medications prior to visit.   Review of Systems  Constitutional:  Negative for chills, fever, malaise/fatigue and weight loss.  HENT:  Negative for congestion, sinus pain and sore throat.   Eyes: Negative.   Respiratory:  Positive for cough and sputum production. Negative for hemoptysis, shortness of breath and wheezing.   Cardiovascular:  Negative for chest pain, palpitations, orthopnea, claudication and leg swelling.  Gastrointestinal:  Negative for abdominal pain, heartburn, nausea and vomiting.  Genitourinary: Negative.   Musculoskeletal:  Negative for joint pain and myalgias.  Skin:  Negative for rash.  Neurological:  Negative for weakness.  Endo/Heme/Allergies: Negative.   Psychiatric/Behavioral: Negative.     Objective:   Vitals:   04/27/22 1412  BP: 120/72  Pulse: 90  SpO2: 94%  Weight: 150 lb (68 kg)  Height: '6\' 1"'$  (1.854 m)   Physical Exam Constitutional:      General: He is not in acute distress. HENT:     Head: Normocephalic and atraumatic.  Eyes:     Conjunctiva/sclera: Conjunctivae normal.  Cardiovascular:     Rate and Rhythm: Normal rate and regular rhythm.     Pulses: Normal pulses.     Heart  sounds: Normal heart sounds. No murmur heard. Pulmonary:     Effort: Pulmonary effort is normal.     Breath sounds: Decreased air movement present. No wheezing, rhonchi or rales.  Musculoskeletal:     Right lower leg: No edema.     Left lower leg: No edema.  Skin:    General: Skin is warm and dry.  Neurological:     General: No focal deficit present.     Mental Status: He  is alert.    CBC    Component Value Date/Time   WBC 8.1 10/10/2021 1639   RBC 4.00 (L) 10/10/2021 1639   HGB 13.4 10/10/2021 1639   HCT 39.7 10/10/2021 1639   PLT 475.0 (H) 10/10/2021 1639   MCV 99.1 10/10/2021 1639   MCH 33.2 09/30/2021 0514   MCHC 33.8 10/10/2021 1639   RDW 15.0 10/10/2021 1639   LYMPHSABS 1.1 10/10/2021 1639   MONOABS 0.6 10/10/2021 1639   EOSABS 0.1 10/10/2021 1639   BASOSABS 0.1 10/10/2021 1639      Latest Ref Rng & Units 01/09/2022    3:03 PM 10/10/2021    4:39 PM 10/01/2021    8:42 AM  BMP  Glucose 70 - 99 mg/dL 112  92  134   BUN 6 - 23 mg/dL 5  11  8    Creatinine 0.40 - 1.50 mg/dL 0.67  0.82  0.78   Sodium 135 - 145 mEq/L 140  138  141   Potassium 3.5 - 5.1 mEq/L 3.4  4.9  3.7   Chloride 96 - 112 mEq/L 100  99  96   CO2 19 - 32 mEq/L 31  32  38   Calcium 8.4 - 10.5 mg/dL 9.5  9.8  8.9    Chest imaging: CT Chest 09/26/21 Mediastinum/Nodes: Enlarged right hilar lymph node measuring up to 1.7 cm. Prominent left hilar lymph nodes. Calcifications along the right left hilar lymph nodes may be sequelae of prior granulomatous disease. No enlarged mediastinal or axillary lymph nodes. Thyroid gland, trachea, and esophagus demonstrate no significant findings.   Lungs/Pleura: Emphysematous changes. Diffuse bronchial wall thickening. Peribronchovascular tree-in-bud nodularity most prominent in the upper lung zones. Linear atelectasis versus scarring within the right middle lobe. No pulmonary nodule. No pulmonary mass. No pleural effusion. No pneumothorax.  PFT:    Latest Ref Rng  & Units 12/14/2021    2:57 PM  PFT Results  FVC-Pre L 3.38   FVC-Predicted Pre % 71   FVC-Post L 3.15   FVC-Predicted Post % 66   Pre FEV1/FVC % % 45   Post FEV1/FCV % % 42   FEV1-Pre L 1.53   FEV1-Predicted Pre % 43   FEV1-Post L 1.34   DLCO uncorrected ml/min/mmHg 14.44   DLCO UNC% % 52   DLCO corrected ml/min/mmHg 14.44   DLCO COR %Predicted % 52   DLVA Predicted % 59   TLC L 9.46   TLC % Predicted % 127   RV % Predicted % 230     Labs:  Path:  Echo:  Heart Catheterization:  Assessment & Plan:   Centrilobular emphysema (HCC)  Nocturnal hypoxemia  Discussion: Travis Hicks is a 72 year old male, daily smoker with COPD/emphysema who is referred to pulmonary clinic for hospital follow up.   He has centrilobular emphysema based on CT Chest scan.   He is to continue trelegy ellipta 1 puff daily. He is to use nebulizer treatment twice daily followed by flutter valve for pulmonary hygiene.   Use 2L of oxygen at night when sleeping. Ok to travel for  a night or two away and not use oxygen then.  Follow up in 1 year.  Freda Jackson, MD [facility] Office: [phone]   Current Outpatient Medications:    albuterol (PROVENTIL) (2.5 MG/3ML) 0.083% nebulizer solution, Take 3 mLs (2.5 mg total) by nebulization every 4 (four) hours as needed for wheezing or shortness of breath., Disp: 75 mL, Rfl: 5  b complex vitamins tablet, Take 1 tablet by mouth daily., Disp: 100 tablet, Rfl: 3   Cholecalciferol (VITAMIN D3) 50 MCG (2000 UT) capsule, Take 2 capsules (4,000 Units total) by mouth daily. (Patient taking differently: Take 2,000 Units by mouth daily.), Disp: 100 capsule, Rfl: 3   donepezil (ARICEPT) 5 MG tablet, Take 1 tablet (5 mg total) by mouth at bedtime., Disp: 90 tablet, Rfl: 3   Fluticasone-Umeclidin-Vilant (TRELEGY ELLIPTA) 100-62.5-25 MCG/ACT AEPB, Inhale 1 puff into the lungs daily., Disp: 28 each, Rfl: 11   guaiFENesin (MUCINEX) 600 MG  12 hr tablet, Take 1 tablet (600 mg total) by mouth 2 (two) times daily., Disp: 60 tablet, Rfl: 1   ipratropium-albuterol (DUONEB) 0.5-2.5 (3) MG/3ML SOLN, Inhale 3 mLs into the lungs every 4 (four) hours as needed., Disp: , Rfl:    latanoprost (XALATAN) 0.005 % ophthalmic solution, Place 1 drop into the left eye at bedtime., Disp: , Rfl:    Multiple Vitamin (MULTIVITAMIN WITH MINERALS) TABS tablet, Take 1 tablet by mouth daily., Disp: 30 tablet, Rfl: 0

## 2022-04-27 NOTE — Patient Instructions (Addendum)
Continue trelegy 1 puff daily - rinse mouth out  Continue nebulizer treatments twice daily followed by flutter valve therapy  Use 2L of oxygen at night when sleeping. Ok to travel for  a night or two away and not use oxygen then.  Follow up in 1 year

## 2022-05-11 ENCOUNTER — Encounter: Payer: Self-pay | Admitting: Pulmonary Disease

## 2022-06-15 ENCOUNTER — Telehealth: Payer: Self-pay

## 2022-06-15 NOTE — Telephone Encounter (Signed)
Patient is requesting a AWV-I.  Patient's wife stated that he has never been called for a wellness check.  Can you check on this for me?  Travis Auxier N. Amillion [place], LPN. [facility] AWV Team Direct Dial: [phone]

## 2022-06-22 ENCOUNTER — Telehealth: Payer: Self-pay

## 2022-06-22 NOTE — Telephone Encounter (Signed)
Contacted Travis Hicks to schedule their annual wellness visit. Appointment made for 06/27/22.  Agnes Lawrence, CMA (AAMA)  [facility]- AWV Program [phone]

## 2022-08-29 ENCOUNTER — Telehealth: Payer: Self-pay | Admitting: Pulmonary Disease

## 2022-08-29 DIAGNOSIS — G4734 Idiopathic sleep related nonobstructive alveolar hypoventilation: Secondary | ICD-10-CM

## 2022-08-29 DIAGNOSIS — J9601 Acute respiratory failure with hypoxia: Secondary | ICD-10-CM

## 2022-08-29 DIAGNOSIS — J9611 Chronic respiratory failure with hypoxia: Secondary | ICD-10-CM

## 2022-08-29 NOTE — Telephone Encounter (Signed)
PT calling stating the O2 company he is with since he left the hospital is so bad he wants to be retested to see if he no longer qual for O2 so he can discontinue using them OR can we issue another O2 company for him. His # is [phone]  It is [employer]

## 2022-08-31 NOTE — Telephone Encounter (Signed)
Spoke with patient regarding prior message.Advised patient I will put a new order in for a new DME company . Patient stated he wanted to get off O2 all together advised patient that will need to be a office visit. Patient stated he will wait on the office visit but would like to change his DME company .    Order was placed to change DME company.  Patient's voice was understanding.Nothing else further needed.

## 2022-09-11 ENCOUNTER — Telehealth: Payer: Self-pay | Admitting: Pulmonary Disease

## 2022-09-11 NOTE — Telephone Encounter (Signed)
PT called saying he is running out of O@ and needs an order sent in. Pls call @ [phone]

## 2022-09-11 NOTE — Telephone Encounter (Signed)
Correction: O2 not O@

## 2022-09-12 ENCOUNTER — Telehealth: Payer: Self-pay | Admitting: Pulmonary Disease

## 2022-09-12 NOTE — Telephone Encounter (Signed)
Disregard

## 2022-09-12 NOTE — Telephone Encounter (Signed)
Please see Tel Encounter from 7/9. It states:   Spoke with patient regarding prior message.Advised patient I will put a new order in for a new DME company . Patient stated he wanted to get off O2 all together advised patient that will need to be a office visit. Patient stated he will wait on the office visit but would like to change his DME company .      Order was placed to change DME company.   Patient's voice was understanding.Nothing else further needed.      PT called [employer] and they said we did not send the paperwork in right. PT could not say what the issues with the order was when I asked him. Please review order and see if it is missing something we need to change and call PT back to advise action taken @ [phone]

## 2022-09-14 NOTE — Telephone Encounter (Signed)
Spoke with patient regarding prior message.Patient stated he wanted to change DME company from [employer] to [employer] for o2 2l at night. Patient stated some information is missing from the order that was placed .  Pcc's can you please check on this .  Thank you

## 2022-09-14 NOTE — Telephone Encounter (Signed)
I just spoke with Travis Hicks with [employer] and she stated the form with correct 02 usage has been sent to [facility]  for her to get Dr. Francine Graven to sign and return.  I then called Mr. Gilkey and let him know [name] is working on the 02 order

## 2022-11-10 ENCOUNTER — Other Ambulatory Visit: Payer: Self-pay | Admitting: Pulmonary Disease

## 2022-11-10 DIAGNOSIS — J432 Centrilobular emphysema: Secondary | ICD-10-CM

## 2022-11-13 ENCOUNTER — Telehealth: Payer: Self-pay | Admitting: Pulmonary Disease

## 2022-11-13 DIAGNOSIS — J432 Centrilobular emphysema: Secondary | ICD-10-CM

## 2022-11-13 NOTE — Telephone Encounter (Signed)
Patient states needs refill for Albuterol solution and Trelegy inhaler. Pharmacy is [facility]. Patient phone number is [phone].

## 2022-11-16 MED ORDER — TRELEGY ELLIPTA 100-62.5-25 MCG/ACT IN AEPB: 1.0000 | INHALATION_SPRAY | Freq: Every day | RESPIRATORY_TRACT | 5 refills | Status: DC

## 2022-11-16 MED ORDER — ALBUTEROL SULFATE (2.5 MG/3ML) 0.083% IN NEBU: 2.5000 mg | INHALATION_SOLUTION | RESPIRATORY_TRACT | 5 refills | Status: DC | PRN

## 2022-11-16 NOTE — Telephone Encounter (Signed)
Patient checking on message for refills. Patient out of Trelegy medication. Patient phone number is [phone].

## 2022-11-16 NOTE — Telephone Encounter (Signed)
Albuterol and trelegy has been sent to preferred pharmacy.   Left detailed message for patient.

## 2022-11-30 ENCOUNTER — Encounter: Payer: Self-pay | Admitting: Family Medicine

## 2022-11-30 ENCOUNTER — Ambulatory Visit: Payer: Federal, State, Local not specified - PPO | Admitting: Family Medicine

## 2022-11-30 VITALS — BP 130/82 | HR 111 | Temp 97.6°F | Ht 73.0 in | Wt 151.0 lb

## 2022-11-30 DIAGNOSIS — Z2821 Immunization not carried out because of patient refusal: Secondary | ICD-10-CM | POA: Diagnosis not present

## 2022-11-30 DIAGNOSIS — H6012 Cellulitis of left external ear: Secondary | ICD-10-CM | POA: Diagnosis not present

## 2022-11-30 DIAGNOSIS — J449 Chronic obstructive pulmonary disease, unspecified: Secondary | ICD-10-CM | POA: Diagnosis not present

## 2022-11-30 MED ORDER — SULFAMETHOXAZOLE-TRIMETHOPRIM 800-160 MG PO TABS: 1.0000 | ORAL_TABLET | Freq: Two times a day (BID) | ORAL | 0 refills | Status: DC

## 2022-11-30 NOTE — Progress Notes (Signed)
Subjective:  Travis Hicks is a 72 y.o. male who presents for a 5 day hx of left external ear pain, redness, tenderness and swelling. States he wears oxygen at home and he woke up with the symptoms 5 days ago. Denies feeling a bite, sting or injury.   Denies fever, chills, headache, URI symptoms, ST, cough.    ROS as in subjective.   Objective: Vitals:   11/30/22 1334 11/30/22 1355  BP: 130/82   Pulse: (!) 115 (!) 111  Temp: 97.6 F (36.4 C)   SpO2: 91% 92%    General appearance: Alert, WD/WN, no distress                             Skin: warm. Mild erythema and edema of left upper external ear, TTP. Ear canal is normal bilaterally. No mastoid tenderness.                            Head: no sinus tenderness                            Eyes: conjunctiva normal, corneas clear, PERRLA                            Ears: pearly TMs, external ear canals normal                          Nose: septum midline, normal.             Mouth/throat: MMM, tongue normal                           Neck: supple, no adenopathy, nontender                          Heart: tachycardia                         Lungs: CTA bilaterally, normal work of breathing       Assessment: Cellulitis of left external ear - Plan: sulfamethoxazole-trimethoprim (BACTRIM DS) 800-160 MG tablet  COPD mixed type (HCC)  Tetanus toxoid vaccination declined   Plan: Cellulitis of left external ear, helix and scaphoid fossa - Bactrim prescribed.  Vitals and breathing is baseline per patient. COPD hx. Pulse improved to 106.  Td is overdue. Recommend we update this today in case he had a bite from unknown insect. He declines.

## 2022-11-30 NOTE — Patient Instructions (Signed)
Take the antibiotic as prescribed with food.   Use warm compresses on your ear.   Follow up if your symptoms are getting worse or if you are not back to baseline when you complete the antibiotic.

## 2022-12-14 ENCOUNTER — Encounter: Payer: Self-pay | Admitting: Internal Medicine

## 2022-12-14 ENCOUNTER — Ambulatory Visit (INDEPENDENT_AMBULATORY_CARE_PROVIDER_SITE_OTHER): Payer: Federal, State, Local not specified - PPO | Admitting: Internal Medicine

## 2022-12-14 VITALS — BP 104/70 | HR 92 | Temp 98.0°F | Ht 73.0 in | Wt 154.0 lb

## 2022-12-14 DIAGNOSIS — Z125 Encounter for screening for malignant neoplasm of prostate: Secondary | ICD-10-CM

## 2022-12-14 DIAGNOSIS — R634 Abnormal weight loss: Secondary | ICD-10-CM

## 2022-12-14 DIAGNOSIS — Z0001 Encounter for general adult medical examination with abnormal findings: Secondary | ICD-10-CM | POA: Diagnosis not present

## 2022-12-14 DIAGNOSIS — Z13220 Encounter for screening for lipoid disorders: Secondary | ICD-10-CM | POA: Diagnosis not present

## 2022-12-14 DIAGNOSIS — Z0000 Encounter for general adult medical examination without abnormal findings: Secondary | ICD-10-CM

## 2022-12-14 DIAGNOSIS — F0390 Unspecified dementia without behavioral disturbance: Secondary | ICD-10-CM

## 2022-12-14 DIAGNOSIS — H6123 Impacted cerumen, bilateral: Secondary | ICD-10-CM

## 2022-12-14 LAB — URINALYSIS, ROUTINE W REFLEX MICROSCOPIC
Bilirubin Urine: NEGATIVE
Ketones, ur: NEGATIVE
Leukocytes,Ua: NEGATIVE
Nitrite: NEGATIVE
Specific Gravity, Urine: >=1.03 — AB (ref 1.000–1.030)
Total Protein, Urine: NEGATIVE
Urine Glucose: NEGATIVE
Urobilinogen, UA: 0.2 (ref 0.0–1.0)
pH: 6 (ref 5.0–8.0)

## 2022-12-14 LAB — LIPID PANEL
Cholesterol: 203 mg/dL — ABNORMAL HIGH (ref 0–200)
HDL: 76.1 mg/dL (ref >39.00)
LDL Cholesterol: 102 mg/dL — ABNORMAL HIGH (ref 0–99)
NonHDL: 127.01
Total CHOL/HDL Ratio: 3
Triglycerides: 124 mg/dL (ref 0.0–149.0)
VLDL: 24.8 mg/dL (ref 0.0–40.0)

## 2022-12-14 LAB — CBC WITH DIFFERENTIAL/PLATELET
Basophils Absolute: 0 10*3/uL (ref 0.0–0.1)
Basophils Relative: 0.9 % (ref 0.0–3.0)
Eosinophils Absolute: 0.1 10*3/uL (ref 0.0–0.7)
Eosinophils Relative: 1.9 % (ref 0.0–5.0)
HCT: 47.3 % (ref 39.0–52.0)
Hemoglobin: 15.3 g/dL (ref 13.0–17.0)
Lymphocytes Relative: 24.7 % (ref 12.0–46.0)
Lymphs Abs: 1.4 10*3/uL (ref 0.7–4.0)
MCHC: 32.4 g/dL (ref 30.0–36.0)
MCV: 97.2 fL (ref 78.0–100.0)
Monocytes Absolute: 0.7 10*3/uL (ref 0.1–1.0)
Monocytes Relative: 12.4 % — ABNORMAL HIGH (ref 3.0–12.0)
Neutro Abs: 3.5 10*3/uL (ref 1.4–7.7)
Neutrophils Relative %: 60.1 % (ref 43.0–77.0)
Platelets: 385 10*3/uL (ref 150.0–400.0)
RBC: 4.87 Mil/uL (ref 4.22–5.81)
RDW: 14.2 % (ref 11.5–15.5)
WBC: 5.8 10*3/uL (ref 4.0–10.5)

## 2022-12-14 LAB — COMPREHENSIVE METABOLIC PANEL
ALT: 20 U/L (ref 0–53)
AST: 20 U/L (ref 0–37)
Albumin: 4.5 g/dL (ref 3.5–5.2)
Alkaline Phosphatase: 59 U/L (ref 39–117)
BUN: 13 mg/dL (ref 6–23)
CO2: 33 meq/L — ABNORMAL HIGH (ref 19–32)
Calcium: 10.5 mg/dL (ref 8.4–10.5)
Chloride: 97 meq/L (ref 96–112)
Creatinine, Ser: 0.79 mg/dL (ref 0.40–1.50)
GFR: 88.82 mL/min (ref >60.00)
Glucose, Bld: 96 mg/dL (ref 70–99)
Potassium: 5.3 meq/L — ABNORMAL HIGH (ref 3.5–5.1)
Sodium: 136 meq/L (ref 135–145)
Total Bilirubin: 0.8 mg/dL (ref 0.2–1.2)
Total Protein: 7.1 g/dL (ref 6.0–8.3)

## 2022-12-14 LAB — PSA: PSA: 0.76 ng/mL (ref 0.10–4.00)

## 2022-12-14 LAB — TSH: TSH: 2.27 u[IU]/mL (ref 0.35–5.50)

## 2022-12-14 NOTE — Progress Notes (Signed)
Subjective:  Patient ID: Travis Hicks, male    DOB: 01-27-1951  Age: 72 y.o. MRN: [id number]  CC: Annual Exam   HPI Travis Hicks presents for a well exam  Outpatient Medications Prior to Visit  Medication Sig Dispense Refill   albuterol (PROVENTIL) (2.5 MG/3ML) 0.083% nebulizer solution Take 3 mLs (2.5 mg total) by nebulization every 4 (four) hours as needed for wheezing or shortness of breath. 75 mL 5   b complex vitamins tablet Take 1 tablet by mouth daily. 100 tablet 3   Cholecalciferol (VITAMIN D3) 50 MCG (2000 UT) capsule Take 2 capsules (4,000 Units total) by mouth daily. (Patient taking differently: Take 2,000 Units by mouth daily.) 100 capsule 3   donepezil (ARICEPT) 5 MG tablet Take 1 tablet (5 mg total) by mouth at bedtime. 90 tablet 3   Fluticasone-Umeclidin-Vilant (TRELEGY ELLIPTA) 100-62.5-25 MCG/ACT AEPB Inhale 1 puff into the lungs daily. 28 each 5   guaiFENesin (MUCINEX) 600 MG 12 hr tablet Take 1 tablet (600 mg total) by mouth 2 (two) times daily. 60 tablet 1   ipratropium-albuterol (DUONEB) 0.5-2.5 (3) MG/3ML SOLN Inhale 3 mLs into the lungs every 4 (four) hours as needed.     latanoprost (XALATAN) 0.005 % ophthalmic solution Place 1 drop into the left eye at bedtime.     Multiple Vitamin (MULTIVITAMIN WITH MINERALS) TABS tablet Take 1 tablet by mouth daily. 30 tablet 0   sulfamethoxazole-trimethoprim (BACTRIM DS) 800-160 MG tablet Take 1 tablet by mouth 2 (two) times daily. 20 tablet 0   No facility-administered medications prior to visit.    ROS: Review of Systems  Constitutional:  Negative for appetite change, fatigue and unexpected weight change.  HENT:  Negative for congestion, nosebleeds, sneezing, sore throat and trouble swallowing.   Eyes:  Negative for itching and visual disturbance.  Respiratory:  Positive for shortness of breath. Negative for cough and wheezing.   Cardiovascular:  Negative for chest pain, palpitations and leg swelling.   Gastrointestinal:  Negative for abdominal distention, blood in stool, diarrhea and nausea.  Genitourinary:  Negative for frequency and hematuria.  Musculoskeletal:  Negative for back pain, gait problem, joint swelling and neck pain.  Skin:  Negative for rash.  Neurological:  Negative for dizziness, tremors, speech difficulty and weakness.  Psychiatric/Behavioral:  Negative for agitation, dysphoric mood and sleep disturbance. The patient is not nervous/anxious.     Objective:  BP 104/70 (BP Location: Left Arm, Patient Position: Sitting, Cuff Size: Normal)   Pulse 92   Temp 98 F (36.7 C) (Oral)   Ht 6\' 1"  (1.854 m)   Wt 154 lb (69.9 kg)   SpO2 96%   BMI 20.32 kg/m   BP Readings from Last 3 Encounters:  12/14/22 104/70  11/30/22 130/82  04/27/22 120/72    Wt Readings from Last 3 Encounters:  12/14/22 154 lb (69.9 kg)  11/30/22 151 lb (68.5 kg)  04/27/22 150 lb (68 kg)    Physical Exam Constitutional:      General: He is not in acute distress.    Appearance: Normal appearance. He is well-developed.     Comments: NAD  Eyes:     Conjunctiva/sclera: Conjunctivae normal.     Pupils: Pupils are equal, round, and reactive to light.  Neck:     Thyroid: No thyromegaly.     Vascular: No JVD.  Cardiovascular:     Rate and Rhythm: Normal rate and regular rhythm.     Heart sounds: Normal heart sounds. No murmur  heard.    No friction rub. No gallop.  Pulmonary:     Effort: Pulmonary effort is normal. No respiratory distress.     Breath sounds: Normal breath sounds. No wheezing or rales.  Chest:     Chest wall: No tenderness.  Abdominal:     General: Bowel sounds are normal. There is no distension.     Palpations: Abdomen is soft. There is no mass.     Tenderness: There is no abdominal tenderness. There is no guarding or rebound.  Musculoskeletal:        General: No tenderness. Normal range of motion.     Cervical back: Normal range of motion.  Lymphadenopathy:      Cervical: No cervical adenopathy.  Skin:    General: Skin is warm and dry.     Findings: No rash.  Neurological:     Mental Status: He is alert and oriented to person, place, and time.     Cranial Nerves: No cranial nerve deficit.     Motor: No abnormal muscle tone.     Coordination: Coordination normal.     Gait: Gait normal.     Deep Tendon Reflexes: Reflexes are normal and symmetric.  Psychiatric:        Behavior: Behavior normal.        Thought Content: Thought content normal.        Judgment: Judgment normal.   B wax impaction Caries   Lab Results  Component Value Date   WBC 8.1 10/10/2021   HGB 13.4 10/10/2021   HCT 39.7 10/10/2021   PLT 475.0 (H) 10/10/2021   GLUCOSE 112 (H) 01/09/2022   CHOL 140 03/05/2020   TRIG 166 (H) 03/05/2020   HDL 73 03/05/2020   LDLCALC 43 03/05/2020   ALT 15 01/09/2022   AST 25 01/09/2022   NA 140 01/09/2022   K 3.4 (L) 01/09/2022   CL 100 01/09/2022   CREATININE 0.67 01/09/2022   BUN 5 (L) 01/09/2022   CO2 31 01/09/2022   TSH 0.225 (L) 09/27/2021   PSA 0.37 03/05/2020   INR 0.9 09/26/2021    CT Angio Chest PE W and/or Wo Contrast  Result Date: 09/26/2021 CLINICAL DATA:  Pulmonary embolism (PE) suspected, high prob; Abdominal pain, acute, nonlocalized Abd pain Pt via POV sent from Dr office due to low O2 saturation in office at appointment today, being seen for COPD eval. Pt arrives visibly SOB with O2 sat 70% on room air, increased to 80% on 3L nasal cannula. EXAM: CT ANGIOGRAPHY CHEST CT ABDOMEN AND PELVIS WITH CONTRAST TECHNIQUE: Multidetector CT imaging of the chest was performed using the standard protocol during bolus administration of intravenous contrast. Multiplanar CT image reconstructions and MIPs were obtained to evaluate the vascular anatomy. Multidetector CT imaging of the abdomen and pelvis was performed using the standard protocol during bolus administration of intravenous contrast. RADIATION DOSE REDUCTION: This exam was  performed according to the departmental dose-optimization program which includes automated exposure control, adjustment of the mA and/or kV according to patient size and/or use of iterative reconstruction technique. CONTRAST:  OMNIPAQUE IOHEXOL 350 MG/ML SOLN COMPARISON:  None Available. FINDINGS: CTA CHEST FINDINGS Cardiovascular: Satisfactory opacification of the pulmonary arteries to the segmental level. No central or segmental evidence of pulmonary embolism. Limited evaluation of the subsegmental level due to timing of contrast. The main pulmonary artery measures at the upper limits of normal. Normal heart size. No significant pericardial effusion. The thoracic aorta is normal in caliber. Mild  atherosclerotic plaque of the thoracic aorta. No coronary artery calcifications. Mediastinum/Nodes: Enlarged right hilar lymph node measuring up to 1.7 cm. Prominent left hilar lymph nodes. Calcifications along the right left hilar lymph nodes may be sequelae of prior granulomatous disease. No enlarged mediastinal or axillary lymph nodes. Thyroid gland, trachea, and esophagus demonstrate no significant findings. Lungs/Pleura: Emphysematous changes. Diffuse bronchial wall thickening. Peribronchovascular tree-in-bud nodularity most prominent in the upper lung zones. Linear atelectasis versus scarring within the right middle lobe. No pulmonary nodule. No pulmonary mass. No pleural effusion. No pneumothorax. Musculoskeletal: No chest wall abnormality. No suspicious lytic or blastic osseous lesions. No acute displaced fracture. Multilevel degenerative changes of the spine. Review of the MIP images confirms the above findings. CT ABDOMEN and PELVIS FINDINGS Hepatobiliary: No focal liver abnormality. The gallbladder is contracted. No gallstones, gallbladder wall thickening, or pericholecystic fluid. No biliary dilatation. Pancreas: No focal lesion. Normal pancreatic contour. No surrounding inflammatory changes. No main  pancreatic ductal dilatation. Spleen: Normal in size without focal abnormality. Adrenals/Urinary Tract: No adrenal nodule bilaterally. Bilateral kidneys enhance symmetrically. A left renal 1.4 cm fluid density lesion likely represents a simple renal cyst. Simple renal cysts, in the absence of clinically indicated signs/symptoms, require no independent follow-up. No hydronephrosis. No hydroureter. The urinary bladder is unremarkable. Stomach/Bowel: Stomach is within normal limits. No evidence of bowel wall thickening or dilatation. Scattered colonic diverticulosis appendix appears normal. Vascular/Lymphatic: No abdominal aorta or iliac aneurysm. Severe atherosclerotic plaque of the aorta and its branches. No abdominal, pelvic, or inguinal lymphadenopathy. Reproductive: Prostate is unremarkable. Other: No intraperitoneal free fluid. No intraperitoneal free gas. No organized fluid collection. Musculoskeletal: No abdominal wall hernia or abnormality. 3.7 cm fat density lesion within the right gluteal musculature likely represents lipomatous lesion. No suspicious lytic or blastic osseous lesions. No acute displaced fracture. Grade 1 anterolisthesis of L4 on L5. Review of the MIP images confirms the above findings. IMPRESSION: 1. No central or segmental pulmonary embolus. Limited evaluation of the subsegmental level due to timing of contrast. 2. Pulmonary findings suggestive of an atypical infection. No follow-up needed if patient is low-risk (and has no known or suspected primary neoplasm). Non-contrast chest CT can be considered in 12 months if patient is high-risk. This recommendation follows the consensus statement: Guidelines for Management of Incidental Pulmonary Nodules Detected on CT Images: From the Fleischner Society 2017; Radiology 2017; 284:228-243. 3. Right middle lobe linear atelectasis versus scarring. Developing infection not excluded. 4. Right hilar lymphadenopathy may be reactive in etiology. Recommend  attention on follow-up. 5. Colonic diverticulosis with no acute diverticulitis. 6.  Aortic Atherosclerosis (ICD10-I70.0). Electronically Signed   By: Tish Frederickson M.D.   On: 09/26/2021 19:25   CT ABDOMEN PELVIS W CONTRAST  Result Date: 09/26/2021 CLINICAL DATA:  Pulmonary embolism (PE) suspected, high prob; Abdominal pain, acute, nonlocalized Abd pain Pt via POV sent from Dr office due to low O2 saturation in office at appointment today, being seen for COPD eval. Pt arrives visibly SOB with O2 sat 70% on room air, increased to 80% on 3L nasal cannula. EXAM: CT ANGIOGRAPHY CHEST CT ABDOMEN AND PELVIS WITH CONTRAST TECHNIQUE: Multidetector CT imaging of the chest was performed using the standard protocol during bolus administration of intravenous contrast. Multiplanar CT image reconstructions and MIPs were obtained to evaluate the vascular anatomy. Multidetector CT imaging of the abdomen and pelvis was performed using the standard protocol during bolus administration of intravenous contrast. RADIATION DOSE REDUCTION: This exam was performed according to the departmental dose-optimization  program which includes automated exposure control, adjustment of the mA and/or kV according to patient size and/or use of iterative reconstruction technique. CONTRAST:  OMNIPAQUE IOHEXOL 350 MG/ML SOLN COMPARISON:  None Available. FINDINGS: CTA CHEST FINDINGS Cardiovascular: Satisfactory opacification of the pulmonary arteries to the segmental level. No central or segmental evidence of pulmonary embolism. Limited evaluation of the subsegmental level due to timing of contrast. The main pulmonary artery measures at the upper limits of normal. Normal heart size. No significant pericardial effusion. The thoracic aorta is normal in caliber. Mild atherosclerotic plaque of the thoracic aorta. No coronary artery calcifications. Mediastinum/Nodes: Enlarged right hilar lymph node measuring up to 1.7 cm. Prominent left hilar lymph  nodes. Calcifications along the right left hilar lymph nodes may be sequelae of prior granulomatous disease. No enlarged mediastinal or axillary lymph nodes. Thyroid gland, trachea, and esophagus demonstrate no significant findings. Lungs/Pleura: Emphysematous changes. Diffuse bronchial wall thickening. Peribronchovascular tree-in-bud nodularity most prominent in the upper lung zones. Linear atelectasis versus scarring within the right middle lobe. No pulmonary nodule. No pulmonary mass. No pleural effusion. No pneumothorax. Musculoskeletal: No chest wall abnormality. No suspicious lytic or blastic osseous lesions. No acute displaced fracture. Multilevel degenerative changes of the spine. Review of the MIP images confirms the above findings. CT ABDOMEN and PELVIS FINDINGS Hepatobiliary: No focal liver abnormality. The gallbladder is contracted. No gallstones, gallbladder wall thickening, or pericholecystic fluid. No biliary dilatation. Pancreas: No focal lesion. Normal pancreatic contour. No surrounding inflammatory changes. No main pancreatic ductal dilatation. Spleen: Normal in size without focal abnormality. Adrenals/Urinary Tract: No adrenal nodule bilaterally. Bilateral kidneys enhance symmetrically. A left renal 1.4 cm fluid density lesion likely represents a simple renal cyst. Simple renal cysts, in the absence of clinically indicated signs/symptoms, require no independent follow-up. No hydronephrosis. No hydroureter. The urinary bladder is unremarkable. Stomach/Bowel: Stomach is within normal limits. No evidence of bowel wall thickening or dilatation. Scattered colonic diverticulosis appendix appears normal. Vascular/Lymphatic: No abdominal aorta or iliac aneurysm. Severe atherosclerotic plaque of the aorta and its branches. No abdominal, pelvic, or inguinal lymphadenopathy. Reproductive: Prostate is unremarkable. Other: No intraperitoneal free fluid. No intraperitoneal free gas. No organized fluid  collection. Musculoskeletal: No abdominal wall hernia or abnormality. 3.7 cm fat density lesion within the right gluteal musculature likely represents lipomatous lesion. No suspicious lytic or blastic osseous lesions. No acute displaced fracture. Grade 1 anterolisthesis of L4 on L5. Review of the MIP images confirms the above findings. IMPRESSION: 1. No central or segmental pulmonary embolus. Limited evaluation of the subsegmental level due to timing of contrast. 2. Pulmonary findings suggestive of an atypical infection. No follow-up needed if patient is low-risk (and has no known or suspected primary neoplasm). Non-contrast chest CT can be considered in 12 months if patient is high-risk. This recommendation follows the consensus statement: Guidelines for Management of Incidental Pulmonary Nodules Detected on CT Images: From the Fleischner Society 2017; Radiology 2017; 284:228-243. 3. Right middle lobe linear atelectasis versus scarring. Developing infection not excluded. 4. Right hilar lymphadenopathy may be reactive in etiology. Recommend attention on follow-up. 5. Colonic diverticulosis with no acute diverticulitis. 6.  Aortic Atherosclerosis (ICD10-I70.0). Electronically Signed   By: Tish Frederickson M.D.   On: 09/26/2021 19:25   DG Chest Portable 1 View  Result Date: 09/26/2021 CLINICAL DATA:  Shortness of breath. EXAM: PORTABLE CHEST 1 VIEW COMPARISON:  Chest x-ray dated January 28, 2021. FINDINGS: The heart size and mediastinal contours are within normal limits. Normal pulmonary vascularity. Hazy airspace  density in the right upper lobe. No pleural effusion or pneumothorax. No acute osseous abnormality. IMPRESSION: 1. Right upper lobe pneumonia. Electronically Signed   By: Obie Dredge M.D.   On: 09/26/2021 16:04    Assessment & Plan:   Problem List Items Addressed This Visit     Cerumen impaction    Worse. ENT ref      Relevant Orders   Ambulatory referral to ENT   Well adult exam - Primary      We discussed age appropriate health related issues, including available/recomended screening tests and vaccinations. Labs were ordered to be later reviewed . All questions were answered. We discussed one or more of the following - seat belt use, use of sunscreen/sun exposure exercise, safe sex, fall risk reduction, second hand smoke exposure, firearm use and storage, seat belt use, a need for adhering to healthy diet and exercise. Labs were reviewed and new ordered.  All questions were answered. Last COLON 1/21 Dr Russella Dar Coronary CT calcium info given 2019. 2020:  coronary calcium CT score is 0.  Declined all shots      Relevant Orders   TSH   Urinalysis   CBC with Differential/Platelet   Lipid panel   Comprehensive metabolic panel   PSA   Weight loss    Better Softer foods      Relevant Orders   Vitamin B12   VITAMIN D 25 Hydroxy (Vit-D Deficiency, Fractures)   Dementia without behavioral disturbance (HCC)    Increase Donepezil to 10 mg/d         No orders of the defined types were placed in this encounter.     Follow-up: No follow-ups on file.  Sonda Primes, MD

## 2022-12-14 NOTE — Assessment & Plan Note (Signed)
  We discussed age appropriate health related issues, including available/recomended screening tests and vaccinations. Labs were ordered to be later reviewed . All questions were answered. We discussed one or more of the following - seat belt use, use of sunscreen/sun exposure exercise, safe sex, fall risk reduction, second hand smoke exposure, firearm use and storage, seat belt use, a need for adhering to healthy diet and exercise. Labs were reviewed and new ordered.  All questions were answered. Last COLON 1/21 Dr Russella Dar Coronary CT calcium info given 2019. 2020:  coronary calcium CT score is 0.  Declined all shots

## 2022-12-14 NOTE — Assessment & Plan Note (Signed)
Better Softer foods

## 2022-12-14 NOTE — Assessment & Plan Note (Signed)
Worse ENT ref

## 2022-12-14 NOTE — Assessment & Plan Note (Signed)
Increase Donepezil to 10 mg/d

## 2022-12-15 LAB — VITAMIN B12: Vitamin B-12: 304 pg/mL (ref 211–911)

## 2022-12-15 LAB — VITAMIN D 25 HYDROXY (VIT D DEFICIENCY, FRACTURES): VITD: 37.34 ng/mL (ref 30.00–100.00)

## 2022-12-18 ENCOUNTER — Other Ambulatory Visit: Payer: Self-pay | Admitting: Internal Medicine

## 2023-01-09 ENCOUNTER — Telehealth: Payer: Self-pay | Admitting: Internal Medicine

## 2023-01-09 NOTE — Telephone Encounter (Signed)
Patient states that he his experiencing stomach pains with the donezipil - please let patient know what he should do.  [phone]

## 2023-01-14 MED ORDER — MEMANTINE HCL 5 MG PO TABS: 5.0000 mg | ORAL_TABLET | Freq: Two times a day (BID) | ORAL | 5 refills | Status: DC

## 2023-01-14 NOTE — Telephone Encounter (Signed)
Discontinue donepezil.  Start Namenda.  Thank you

## 2023-01-17 NOTE — Telephone Encounter (Signed)
Spoke with the pt and was able to inform him of Dr. Loren Racer instructions. Pt has stated understanding.

## 2023-01-29 ENCOUNTER — Ambulatory Visit (INDEPENDENT_AMBULATORY_CARE_PROVIDER_SITE_OTHER): Payer: Federal, State, Local not specified - PPO | Admitting: Pulmonary Disease

## 2023-01-29 ENCOUNTER — Encounter: Payer: Self-pay | Admitting: Pulmonary Disease

## 2023-01-29 VITALS — BP 132/81 | HR 99 | Temp 98.1°F | Ht 73.0 in | Wt 153.2 lb

## 2023-01-29 DIAGNOSIS — J9611 Chronic respiratory failure with hypoxia: Secondary | ICD-10-CM | POA: Diagnosis not present

## 2023-01-29 DIAGNOSIS — J441 Chronic obstructive pulmonary disease with (acute) exacerbation: Secondary | ICD-10-CM

## 2023-01-29 MED ORDER — TRELEGY ELLIPTA 200-62.5-25 MCG/ACT IN AEPB: 200.0000 | INHALATION_SPRAY | Freq: Every day | RESPIRATORY_TRACT | Status: DC

## 2023-01-29 MED ORDER — PREDNISONE 10 MG PO TABS: ORAL_TABLET | ORAL | 0 refills | Status: AC

## 2023-01-29 MED ORDER — AZITHROMYCIN 250 MG PO TABS: ORAL_TABLET | ORAL | 0 refills | Status: DC

## 2023-01-29 NOTE — Progress Notes (Unsigned)
Synopsis: Referred in August 2023 for COPD by Jacinta Shoe, MD  Subjective:   PATIENT ID: Travis Hicks GENDER: male DOB: [birth year], MRN: [id number]  HPI  Chief Complaint  Patient presents with  . Follow-up   Travis Hicks is a 72 year old male, daily smoker with COPD/emphysema who returns to pulmonary clinic for COPD.  The patient, with a history of chronic obstructive pulmonary disease (COPD), presents with increased shortness of breath since Friday. She reports using nebulizers at home and has noticed an increase in mucus production. She also reports a change in her albuterol prescription, from a blue and white box to a purple and white box, which she has been using for the past few days. She has been feeling similar to when she had pneumonia previously and woke up with a fever and a burning sensation on her lower lip. She is currently on Trelegy one puff a day and Duoneb (albuterol, ipratropium) solution. She has been monitoring her oxygen levels at home, which have been lower than normal, dropping to the low eighties, requiring her to use supplemental oxygen 24/7. She also reports a recent increase in smoking for about two to three weeks, but has since quit again.  OV 04/27/22 He has been doing ok since last visit. ONO showed he had 5+ hours with SpO2 less than 88%. He was instructed to use 2L O2 at night. We reviewed the importance of this. He expressed concern with not being able to travel. He continues to have productive cough. He stopped smoking 1 week ago and is using nicotine pouches.   OV 01/31/22 He saw Dr. Kendrick Fries 12/22/21 for cough. Sputum were negative from that visit. He continues to have cough. It is not keeping him up at night. He denies wheezing. He continues to smoke 3-4 cigarettes per day.   Initial OV 10/20/21 Patient was recently admitted 8/8 to 8/12 for community acquired pneumonia and acute hypoxemic respiratory failure. He was discharged on arnuity and anoro  inhaler therapy. He was on anoro therapy prior to admission.   Since discharge he has only been using arnuity ellipta daily as his pharmacy said he did not need both inhalers.   He reports his breathing has not improved much since discharge. He has productive cough. He was discharged on supplemental oxygen and is using 3L day/night. He has run out of albuterol nebulizer solution. Prior to admission he was able to walk to his mail box and back but now he struggles to do that.  He is a retired Paramedic. He smokes 8 cigs per day. He was smoking 1 pack per day and has been smoking for 20+ years.  Past Medical History:  Diagnosis Date  . Cataract    veru small per pt .  Marland Kitchen Emphysema of lung (HCC)   . Glaucoma 2018  . Overactive bladder      Family History  Problem Relation Age of Onset  . Pancreatic cancer Father   . Glaucoma Brother   . Colon cancer Neg Hx   . Rectal cancer Neg Hx   . Stomach cancer Neg Hx   . Esophageal cancer Neg Hx   . Prostate cancer Neg Hx   . Colon polyps Neg Hx      Social History   Socioeconomic History  . Marital status: Married    Spouse name: Not on file  . Number of children: 3  . Years of education: Not on file  . Highest education level: Not on  file  Occupational History  . Occupation: retired  Tobacco Use  . Smoking status: Former    Current packs/day: 0.00    Types: Cigarettes    Quit date: 04/21/2022    Years since quitting: 0.7  . Smokeless tobacco: Never  . Tobacco comments:    3 cigs a day. 10/31/21.  Vaping Use  . Vaping status: Never Used  Substance and Sexual Activity  . Alcohol use: Not Currently  . Drug use: No  . Sexual activity: Yes  Other Topics Concern  . Not on file  Social History Narrative  . Not on file   Social Determinants of Health   Financial Resource Strain: Not on file  Food Insecurity: Not on file  Transportation Needs: Not on file  Physical Activity: Not on file  Stress: Not on file  Social  Connections: Not on file  Intimate Partner Violence: Not on file     Allergies  Allergen Reactions  . Donepezil     Stomach pain  . Viagra [Sildenafil Citrate] Other (See Comments)    Unknown per Pt     Outpatient Medications Prior to Visit  Medication Sig Dispense Refill  . albuterol (PROVENTIL) (2.5 MG/3ML) 0.083% nebulizer solution Take 3 mLs (2.5 mg total) by nebulization every 4 (four) hours as needed for wheezing or shortness of breath. 75 mL 5  . b complex vitamins tablet Take 1 tablet by mouth daily. 100 tablet 3  . Cholecalciferol (VITAMIN D3) 50 MCG (2000 UT) capsule Take 2 capsules (4,000 Units total) by mouth daily. (Patient taking differently: Take 2,000 Units by mouth daily.) 100 capsule 3  . Fluticasone-Umeclidin-Vilant (TRELEGY ELLIPTA) 100-62.5-25 MCG/ACT AEPB Inhale 1 puff into the lungs daily. 28 each 5  . guaiFENesin (MUCINEX) 600 MG 12 hr tablet Take 1 tablet (600 mg total) by mouth 2 (two) times daily. 60 tablet 1  . latanoprost (XALATAN) 0.005 % ophthalmic solution Place 1 drop into the left eye at bedtime.    . memantine (NAMENDA) 5 MG tablet Take 1 tablet (5 mg total) by mouth 2 (two) times daily. 60 tablet 5  . Multiple Vitamin (MULTIVITAMIN WITH MINERALS) TABS tablet Take 1 tablet by mouth daily. 30 tablet 0  . ipratropium-albuterol (DUONEB) 0.5-2.5 (3) MG/3ML SOLN Inhale 3 mLs into the lungs every 4 (four) hours as needed.    . sulfamethoxazole-trimethoprim (BACTRIM DS) 800-160 MG tablet Take 1 tablet by mouth 2 (two) times daily. 20 tablet 0   No facility-administered medications prior to visit.   Review of Systems  Constitutional:  Negative for chills, fever, malaise/fatigue and weight loss.  HENT:  Negative for congestion, sinus pain and sore throat.   Eyes: Negative.   Respiratory:  Positive for cough, sputum production, shortness of breath and wheezing. Negative for hemoptysis.   Cardiovascular:  Negative for chest pain, palpitations, orthopnea,  claudication and leg swelling.  Gastrointestinal:  Negative for abdominal pain, heartburn, nausea and vomiting.  Genitourinary: Negative.   Musculoskeletal:  Negative for joint pain and myalgias.  Skin:  Negative for rash.  Neurological:  Negative for weakness.  Endo/Heme/Allergies: Negative.   Psychiatric/Behavioral: Negative.     Objective:   Vitals:   01/29/23 1501  BP: 132/81  Pulse: 99  Temp: 98.1 F (36.7 C)  TempSrc: Temporal  SpO2: 90%  Weight: 153 lb 3.2 oz (69.5 kg)  Height: 6\' 1"  (1.854 m)    Physical Exam Constitutional:      General: He is not in acute distress. HENT:  Head: Normocephalic and atraumatic.  Eyes:     Conjunctiva/sclera: Conjunctivae normal.  Cardiovascular:     Rate and Rhythm: Normal rate and regular rhythm.     Pulses: Normal pulses.     Heart sounds: Normal heart sounds. No murmur heard. Pulmonary:     Effort: Pulmonary effort is normal.     Breath sounds: Decreased air movement present. Wheezing present. No rhonchi or rales.  Musculoskeletal:     Right lower leg: No edema.     Left lower leg: No edema.  Skin:    General: Skin is warm and dry.  Neurological:     General: No focal deficit present.     Mental Status: He is alert.   CBC    Component Value Date/Time   WBC 5.8 12/14/2022 1557   RBC 4.87 12/14/2022 1557   HGB 15.3 12/14/2022 1557   HCT 47.3 12/14/2022 1557   PLT 385.0 12/14/2022 1557   MCV 97.2 12/14/2022 1557   MCH 33.2 09/30/2021 0514   MCHC 32.4 12/14/2022 1557   RDW 14.2 12/14/2022 1557   LYMPHSABS 1.4 12/14/2022 1557   MONOABS 0.7 12/14/2022 1557   EOSABS 0.1 12/14/2022 1557   BASOSABS 0.0 12/14/2022 1557      Latest Ref Rng & Units 12/14/2022    3:57 PM 01/09/2022    3:03 PM 10/10/2021    4:39 PM  BMP  Glucose 70 - 99 mg/dL 96  629  92   BUN 6 - 23 mg/dL 13  5  11    Creatinine 0.40 - 1.50 mg/dL 5.28  4.13  2.44   Sodium 135 - 145 mEq/L 136  140  138   Potassium 3.5 - 5.1 mEq/L 5.3 No hemolysis  seen  3.4  4.9   Chloride 96 - 112 mEq/L 97  100  99   CO2 19 - 32 mEq/L 33  31  32   Calcium 8.4 - 10.5 mg/dL 01.0  9.5  9.8    Chest imaging: CT Chest 09/26/21 Mediastinum/Nodes: Enlarged right hilar lymph node measuring up to 1.7 cm. Prominent left hilar lymph nodes. Calcifications along the right left hilar lymph nodes may be sequelae of prior granulomatous disease. No enlarged mediastinal or axillary lymph nodes. Thyroid gland, trachea, and esophagus demonstrate no significant findings.   Lungs/Pleura: Emphysematous changes. Diffuse bronchial wall thickening. Peribronchovascular tree-in-bud nodularity most prominent in the upper lung zones. Linear atelectasis versus scarring within the right middle lobe. No pulmonary nodule. No pulmonary mass. No pleural effusion. No pneumothorax.  PFT:    Latest Ref Rng & Units 12/14/2021    2:57 PM  PFT Results  FVC-Pre L 3.38   FVC-Predicted Pre % 71   FVC-Post L 3.15   FVC-Predicted Post % 66   Pre FEV1/FVC % % 45   Post FEV1/FCV % % 42   FEV1-Pre L 1.53   FEV1-Predicted Pre % 43   FEV1-Post L 1.34   DLCO uncorrected ml/min/mmHg 14.44   DLCO UNC% % 52   DLCO corrected ml/min/mmHg 14.44   DLCO COR %Predicted % 52   DLVA Predicted % 59   TLC L 9.46   TLC % Predicted % 127   RV % Predicted % 230     Labs:  Path:  Echo:  Heart Catheterization:  Assessment & Plan:   COPD exacerbation (HCC) - Plan: predniSONE (DELTASONE) 10 MG tablet, azithromycin (ZITHROMAX) 250 MG tablet  Chronic respiratory failure with hypoxia (HCC)  Discussion: Travis Hicks is a 72  year old male, daily smoker with COPD/emphysema who is referred to pulmonary clinic for hospital follow up.   COPD exacerbation Increased dyspnea, wheezing, and decreased oxygen saturation since Friday. Recent change in nebulizer medication from Duoneb to Albuterol. No fever today, but reported possible fever a few days ago. -Start Azithromycin (Z-Pak) today:2 pills on  day 1, then 1 pill daily for the next 4 days. -Start Prednisone taper over 12 days starting today. -Continue Albuterol nebulizer as needed. -Increase Trelegy to daily for the next 1-2 weeks, then return to daily. -Check oxygen saturation at home and use supplemental oxygen as needed.  Tobacco use Recently resumed smoking after a period of cessation, but has quit again. -Encouraged to maintain cessation.  Follow-up Monitor symptoms and seek medical attention if worsening dyspnea or other concerning symptoms develop.  Melody Comas, MD [facility] Office: [phone]   Current Outpatient Medications:  .  albuterol (PROVENTIL) (2.5 MG/3ML) 0.083% nebulizer solution, Take 3 mLs (2.5 mg total) by nebulization every 4 (four) hours as needed for wheezing or shortness of breath., Disp: 75 mL, Rfl: 5 .  azithromycin (ZITHROMAX) 250 MG tablet, Take as directed, Disp: 6 tablet, Rfl: 0 .  b complex vitamins tablet, Take 1 tablet by mouth daily., Disp: 100 tablet, Rfl: 3 .  Cholecalciferol (VITAMIN D3) 50 MCG (2000 UT) capsule, Take 2 capsules (4,000 Units total) by mouth daily. (Patient taking differently: Take 2,000 Units by mouth daily.), Disp: 100 capsule, Rfl: 3 .  Fluticasone-Umeclidin-Vilant (TRELEGY ELLIPTA) 100-62.5-25 MCG/ACT AEPB, Inhale 1 puff into the lungs daily., Disp: 28 each, Rfl: 5 .  Fluticasone-Umeclidin-Vilant (TRELEGY ELLIPTA) 200-62.5-25 MCG/ACT AEPB, Inhale 200 each into the lungs daily., Disp: , Rfl:  .  guaiFENesin (MUCINEX) 600 MG 12 hr tablet, Take 1 tablet (600 mg total) by mouth 2 (two) times daily., Disp: 60 tablet, Rfl: 1 .  latanoprost (XALATAN) 0.005 % ophthalmic solution, Place 1 drop into the left eye at bedtime., Disp: , Rfl:  .  memantine (NAMENDA) 5 MG tablet, Take 1 tablet (5 mg total) by mouth 2 (two) times daily., Disp: 60 tablet, Rfl: 5 .  Multiple Vitamin (MULTIVITAMIN WITH MINERALS) TABS tablet, Take 1 tablet by mouth  daily., Disp: 30 tablet, Rfl: 0 .  predniSONE (DELTASONE) 10 MG tablet, Take 4 tablets (40 mg total) by mouth daily with breakfast for 3 days, THEN 3 tablets (30 mg total) daily with breakfast for 3 days, THEN 2 tablets (20 mg total) daily with breakfast for 3 days, THEN 1 tablet (10 mg total) daily with breakfast for 3 days., Disp: 30 tablet, Rfl: 0

## 2023-01-29 NOTE — Patient Instructions (Addendum)
Use trelegy sample until gone and return to your trelegy prescription  Start prednisone taper 40mg  daily x 3 days 30mg  daily x 3 days 20mg  daily x 3 days 10mg  daily x 3 days  Start Zpak antibiotic  Use albuterol nebulizer treatments every 4-6 hours as needed  Follow up in 4 months

## 2023-01-31 ENCOUNTER — Encounter: Payer: Self-pay | Admitting: Pulmonary Disease

## 2023-01-31 ENCOUNTER — Telehealth: Payer: Self-pay | Admitting: Pulmonary Disease

## 2023-01-31 NOTE — Telephone Encounter (Signed)
Pt took 12 pills of Prednisone bc he misread the instructions

## 2023-01-31 NOTE — Telephone Encounter (Signed)
I spoke with patient and he took 12 tabs of 10mg  prednisone. He is feeling ok. I reviewed the steroid taper with him and he expressed understanding. Will send in 8 more tabs of prednisone to complete the taper.   He is using oxygen during the day due to desaturations. I instructed him to call our clinic back if not feeling better at end of taper or to go to the hospital if his Oxygen levels are not going back to baseline.   Melody Comas, MD [facility] Office: [phone]

## 2023-02-02 ENCOUNTER — Encounter: Payer: Self-pay | Admitting: Pulmonary Disease

## 2023-02-02 MED ORDER — PREDNISONE 10 MG PO TABS: 10.0000 mg | ORAL_TABLET | Freq: Every day | ORAL | 0 refills | Status: DC

## 2023-02-07 ENCOUNTER — Other Ambulatory Visit: Payer: Self-pay | Admitting: Internal Medicine

## 2023-03-08 ENCOUNTER — Ambulatory Visit (INDEPENDENT_AMBULATORY_CARE_PROVIDER_SITE_OTHER): Payer: Medicare Other | Admitting: Pulmonary Disease

## 2023-03-08 ENCOUNTER — Encounter: Payer: Self-pay | Admitting: Pulmonary Disease

## 2023-03-08 ENCOUNTER — Telehealth: Payer: Self-pay | Admitting: Pulmonary Disease

## 2023-03-08 VITALS — BP 102/82 | HR 119 | Temp 97.3°F | Ht 73.0 in | Wt 157.6 lb

## 2023-03-08 DIAGNOSIS — J441 Chronic obstructive pulmonary disease with (acute) exacerbation: Secondary | ICD-10-CM | POA: Diagnosis not present

## 2023-03-08 MED ORDER — PREDNISONE 10 MG PO TABS: ORAL_TABLET | ORAL | 0 refills | Status: DC

## 2023-03-08 MED ORDER — AZITHROMYCIN 250 MG PO TABS: 250.0000 mg | ORAL_TABLET | Freq: Every day | ORAL | 0 refills | Status: DC

## 2023-03-08 NOTE — Patient Instructions (Addendum)
Thank you for visiting Dr. Tonia Brooms at [facility]. Today we recommend the following:  Meds ordered this encounter  Medications   predniSONE (DELTASONE) 10 MG tablet    Sig: Take 4 tabs by mouth once daily x4 days, then 3 tabs x4 days, 2 tabs x4 days, 1 tab x4 days and stop.    Dispense:  40 tablet    Refill:  0   azithromycin (ZITHROMAX) 250 MG tablet    Sig: Take 1 tablet (250 mg total) by mouth daily.    Dispense:  6 tablet    Refill:  0   Return in about 2 weeks (around 03/22/2023) for w/ Dr. Francine Graven .    Please do your part to reduce the spread of COVID-19.

## 2023-03-08 NOTE — Progress Notes (Signed)
Synopsis: Referred in August 2023 for COPD by Jacinta Shoe, MD  Subjective:   PATIENT ID: Travis Hicks GENDER: male DOB: November 10, 1950, MRN: [id number]  HPI  Chief Complaint  Patient presents with   Acute Visit    SOB upon exertion, mucous    Travis Hicks is a 73 year old male, daily smoker with COPD/emphysema who returns to pulmonary clinic for COPD.  The patient, with a history of chronic obstructive pulmonary disease (COPD), presents with increased shortness of breath since Friday. She reports using nebulizers at home and has noticed an increase in mucus production. She also reports a change in her albuterol prescription, from a blue and white box to a purple and white box, which she has been using for the past few days. She has been feeling similar to when she had pneumonia previously and woke up with a fever and a burning sensation on her lower lip. She is currently on Trelegy one puff a day and Duoneb (albuterol, ipratropium) solution. She has been monitoring her oxygen levels at home, which have been lower than normal, dropping to the low eighties, requiring her to use supplemental oxygen 24/7. She also reports a recent increase in smoking for about two to three weeks, but has since quit again.  OV 04/27/22 He has been doing ok since last visit. ONO showed he had 5+ hours with SpO2 less than 88%. He was instructed to use 2L O2 at night. We reviewed the importance of this. He expressed concern with not being able to travel. He continues to have productive cough. He stopped smoking 1 week ago and is using nicotine pouches.   OV 01/31/22 He saw Dr. Kendrick Fries 12/22/21 for cough. Sputum were negative from that visit. He continues to have cough. It is not keeping him up at night. He denies wheezing. He continues to smoke 3-4 cigarettes per day.   Initial OV 10/20/21 Patient was recently admitted 8/8 to 8/12 for community acquired pneumonia and acute hypoxemic respiratory failure. He was  discharged on arnuity and anoro inhaler therapy. He was on anoro therapy prior to admission.   Since discharge he has only been using arnuity ellipta daily as his pharmacy said he did not need both inhalers.   He reports his breathing has not improved much since discharge. He has productive cough. He was discharged on supplemental oxygen and is using 3L day/night. He has run out of albuterol nebulizer solution. Prior to admission he was able to walk to his mail box and back but now he struggles to do that.  He is a retired Paramedic. He smokes 8 cigs per day. He was smoking 1 pack per day and has been smoking for 20+ years.   OV 03/08/2023: Office visit today for acute visit.  Patient has increased chest tightness shortness of breath cough and wheezing.  He states that every time he comes off of prednisone a few weeks later he flares up again.  He has been using his Trelegy daily.  He is having trouble sleeping at night.  He has daily chest congestion less sputum production than before.  But still gets short of breath with minimal exertion.   Past Medical History:  Diagnosis Date   Cataract    veru small per pt .   Emphysema of lung (HCC)    Glaucoma 2018   Overactive bladder      Family History  Problem Relation Age of Onset   Pancreatic cancer Father    Glaucoma  Brother    Colon cancer Neg Hx    Rectal cancer Neg Hx    Stomach cancer Neg Hx    Esophageal cancer Neg Hx    Prostate cancer Neg Hx    Colon polyps Neg Hx      Social History   Socioeconomic History   Marital status: Married    Spouse name: Not on file   Number of children: 3   Years of education: Not on file   Highest education level: Not on file  Occupational History   Occupation: retired  Tobacco Use   Smoking status: Former    Current packs/day: 0.00    Types: Cigarettes    Quit date: 04/21/2022    Years since quitting: 0.8   Smokeless tobacco: Never   Tobacco comments:    3 cigs a day. 10/31/21.   Vaping Use   Vaping status: Never Used  Substance and Sexual Activity   Alcohol use: Not Currently   Drug use: No   Sexual activity: Yes  Other Topics Concern   Not on file  Social History Narrative   Not on file   Social Drivers of Health   Financial Resource Strain: Not on file  Food Insecurity: Not on file  Transportation Needs: Not on file  Physical Activity: Not on file  Stress: Not on file  Social Connections: Not on file  Intimate Partner Violence: Not on file     Allergies  Allergen Reactions   Donepezil     Stomach pain   Viagra [Sildenafil Citrate] Other (See Comments)    Unknown per Pt     Outpatient Medications Prior to Visit  Medication Sig Dispense Refill   albuterol (PROVENTIL) (2.5 MG/3ML) 0.083% nebulizer solution Take 3 mLs (2.5 mg total) by nebulization every 4 (four) hours as needed for wheezing or shortness of breath. 75 mL 5   b complex vitamins tablet Take 1 tablet by mouth daily. 100 tablet 3   Cholecalciferol (VITAMIN D3) 50 MCG (2000 UT) capsule Take 2 capsules (4,000 Units total) by mouth daily. (Patient taking differently: Take 2,000 Units by mouth daily.) 100 capsule 3   Fluticasone-Umeclidin-Vilant (TRELEGY ELLIPTA) 100-62.5-25 MCG/ACT AEPB Inhale 1 puff into the lungs daily. 28 each 5   guaiFENesin (MUCINEX) 600 MG 12 hr tablet Take 1 tablet (600 mg total) by mouth 2 (two) times daily. 60 tablet 1   latanoprost (XALATAN) 0.005 % ophthalmic solution Place 1 drop into the left eye at bedtime.     memantine (NAMENDA) 5 MG tablet TAKE 1 TABLET BY MOUTH TWICE A DAY 180 tablet 2   Multiple Vitamin (MULTIVITAMIN WITH MINERALS) TABS tablet Take 1 tablet by mouth daily. 30 tablet 0   azithromycin (ZITHROMAX) 250 MG tablet Take as directed (Patient not taking: Reported on 03/08/2023) 6 tablet 0   Fluticasone-Umeclidin-Vilant (TRELEGY ELLIPTA) 200-62.5-25 MCG/ACT AEPB Inhale 200 each into the lungs daily. (Patient not taking: Reported on 03/08/2023)      predniSONE (DELTASONE) 10 MG tablet Take 1 tablet (10 mg total) by mouth daily with breakfast. (Patient not taking: Reported on 03/08/2023) 8 tablet 0   No facility-administered medications prior to visit.   Review of Systems  Constitutional:  Negative for chills, fever, malaise/fatigue and weight loss.  HENT:  Negative for hearing loss, sore throat and tinnitus.   Eyes:  Negative for blurred vision and double vision.  Respiratory:  Positive for cough, sputum production, shortness of breath and wheezing. Negative for hemoptysis and stridor.  Cardiovascular:  Negative for chest pain, palpitations, orthopnea, leg swelling and PND.  Gastrointestinal:  Negative for abdominal pain, constipation, diarrhea, heartburn, nausea and vomiting.  Genitourinary:  Negative for dysuria, hematuria and urgency.  Musculoskeletal:  Negative for joint pain and myalgias.  Skin:  Negative for itching and rash.  Neurological:  Negative for dizziness, tingling, weakness and headaches.  Endo/Heme/Allergies:  Negative for environmental allergies. Does not bruise/bleed easily.  Psychiatric/Behavioral:  Negative for depression. The patient is not nervous/anxious and does not have insomnia.   All other systems reviewed and are negative.  Objective:   Vitals:   03/08/23 1409  BP: 102/82  Pulse: (!) 119  Temp: (!) 97.3 F (36.3 C)  TempSrc: Oral  SpO2: 93%  Weight: 157 lb 9.6 oz (71.5 kg)  Height: 6\' 1"  (1.854 m)    Physical Exam Vitals reviewed.  Constitutional:      General: He is not in acute distress.    Appearance: He is well-developed.  HENT:     Head: Normocephalic and atraumatic.     Mouth/Throat:     Pharynx: No oropharyngeal exudate.  Eyes:     Conjunctiva/sclera: Conjunctivae normal.     Pupils: Pupils are equal, round, and reactive to light.  Neck:     Vascular: No JVD.     Trachea: No tracheal deviation.     Comments: Loss of supraclavicular fat Cardiovascular:     Rate and Rhythm:  Normal rate and regular rhythm.     Heart sounds: S1 normal and S2 normal.     Comments: Distant heart tones Pulmonary:     Effort: No tachypnea or accessory muscle usage.     Breath sounds: No stridor. Decreased breath sounds (throughout all lung fields) and wheezing present. No rhonchi or rales.     Comments: Expiratory wheezing present bilaterally Abdominal:     General: Bowel sounds are normal. There is no distension.     Palpations: Abdomen is soft.     Tenderness: There is no abdominal tenderness.  Musculoskeletal:        General: Deformity (muscle wasting ) present.  Skin:    General: Skin is warm and dry.     Capillary Refill: Capillary refill takes less than 2 seconds.     Findings: No rash.  Neurological:     Mental Status: He is alert and oriented to person, place, and time.  Psychiatric:        Behavior: Behavior normal.    CBC    Component Value Date/Time   WBC 5.8 12/14/2022 1557   RBC 4.87 12/14/2022 1557   HGB 15.3 12/14/2022 1557   HCT 47.3 12/14/2022 1557   PLT 385.0 12/14/2022 1557   MCV 97.2 12/14/2022 1557   MCH 33.2 09/30/2021 0514   MCHC 32.4 12/14/2022 1557   RDW 14.2 12/14/2022 1557   LYMPHSABS 1.4 12/14/2022 1557   MONOABS 0.7 12/14/2022 1557   EOSABS 0.1 12/14/2022 1557   BASOSABS 0.0 12/14/2022 1557      Latest Ref Rng & Units 12/14/2022    3:57 PM 01/09/2022    3:03 PM 10/10/2021    4:39 PM  BMP  Glucose 70 - 99 mg/dL 96  161  92   BUN 6 - 23 mg/dL 13  5  11    Creatinine 0.40 - 1.50 mg/dL 0.96  0.45  4.09   Sodium 135 - 145 mEq/L 136  140  138   Potassium 3.5 - 5.1 mEq/L 5.3 No hemolysis seen  3.4  4.9   Chloride 96 - 112 mEq/L 97  100  99   CO2 19 - 32 mEq/L 33  31  32   Calcium 8.4 - 10.5 mg/dL 16.1  9.5  9.8    Chest imaging: CT Chest 09/26/21 Mediastinum/Nodes: Enlarged right hilar lymph node measuring up to 1.7 cm. Prominent left hilar lymph nodes. Calcifications along the right left hilar lymph nodes may be sequelae of prior  granulomatous disease. No enlarged mediastinal or axillary lymph nodes. Thyroid gland, trachea, and esophagus demonstrate no significant findings.   Lungs/Pleura: Emphysematous changes. Diffuse bronchial wall thickening. Peribronchovascular tree-in-bud nodularity most prominent in the upper lung zones. Linear atelectasis versus scarring within the right middle lobe. No pulmonary nodule. No pulmonary mass. No pleural effusion. No pneumothorax.  PFT:    Latest Ref Rng & Units 12/14/2021    2:57 PM  PFT Results  FVC-Pre L 3.38   FVC-Predicted Pre % 71   FVC-Post L 3.15   FVC-Predicted Post % 66   Pre FEV1/FVC % % 45   Post FEV1/FCV % % 42   FEV1-Pre L 1.53   FEV1-Predicted Pre % 43   FEV1-Post L 1.34   DLCO uncorrected ml/min/mmHg 14.44   DLCO UNC% % 52   DLCO corrected ml/min/mmHg 14.44   DLCO COR %Predicted % 52   DLVA Predicted % 59   TLC L 9.46   TLC % Predicted % 127   RV % Predicted % 230     Labs:  Path:  Echo:  Heart Catheterization:  Assessment & Plan:   COPD with acute exacerbation (HCC)  Discussion: This is a 73 year old gentleman daily smoker history of COPD presents with acute exacerbation of COPD.  Plan: Regard start him on prednisone taper as well as a Z-Pak. He can continue his Trelegy daily. Counseled heavily on smoking cessation. Return to clinic in 2 weeks to see if he is improving and to follow-up with Dr. Francine Graven.    Current Outpatient Medications:    albuterol (PROVENTIL) (2.5 MG/3ML) 0.083% nebulizer solution, Take 3 mLs (2.5 mg total) by nebulization every 4 (four) hours as needed for wheezing or shortness of breath., Disp: 75 mL, Rfl: 5   azithromycin (ZITHROMAX) 250 MG tablet, Take 1 tablet (250 mg total) by mouth daily., Disp: 6 tablet, Rfl: 0   b complex vitamins tablet, Take 1 tablet by mouth daily., Disp: 100 tablet, Rfl: 3   Cholecalciferol (VITAMIN D3) 50 MCG (2000 UT) capsule, Take 2 capsules (4,000 Units total) by mouth daily.  (Patient taking differently: Take 2,000 Units by mouth daily.), Disp: 100 capsule, Rfl: 3   Fluticasone-Umeclidin-Vilant (TRELEGY ELLIPTA) 100-62.5-25 MCG/ACT AEPB, Inhale 1 puff into the lungs daily., Disp: 28 each, Rfl: 5   guaiFENesin (MUCINEX) 600 MG 12 hr tablet, Take 1 tablet (600 mg total) by mouth 2 (two) times daily., Disp: 60 tablet, Rfl: 1   latanoprost (XALATAN) 0.005 % ophthalmic solution, Place 1 drop into the left eye at bedtime., Disp: , Rfl:    memantine (NAMENDA) 5 MG tablet, TAKE 1 TABLET BY MOUTH TWICE A DAY, Disp: 180 tablet, Rfl: 2   Multiple Vitamin (MULTIVITAMIN WITH MINERALS) TABS tablet, Take 1 tablet by mouth daily., Disp: 30 tablet, Rfl: 0   predniSONE (DELTASONE) 10 MG tablet, Take 4 tabs by mouth once daily x4 days, then 3 tabs x4 days, 2 tabs x4 days, 1 tab x4 days and stop., Disp: 40 tablet, Rfl: 0   azithromycin (ZITHROMAX) 250 MG tablet,  Take as directed (Patient not taking: Reported on 03/08/2023), Disp: 6 tablet, Rfl: 0   Fluticasone-Umeclidin-Vilant (TRELEGY ELLIPTA) 200-62.5-25 MCG/ACT AEPB, Inhale 200 each into the lungs daily. (Patient not taking: Reported on 03/08/2023), Disp: , Rfl:    predniSONE (DELTASONE) 10 MG tablet, Take 1 tablet (10 mg total) by mouth daily with breakfast. (Patient not taking: Reported on 03/08/2023), Disp: 8 tablet, Rfl: 0   Josephine Igo, DO [facility] 03/08/2023 2:27 PM

## 2023-03-08 NOTE — Telephone Encounter (Signed)
Patient seen for an acute visit with Dr Tonia Brooms on 1/16. BI advised for a 2 week followup however, you are not here in two weeks as well as the Apps are booked. Could you do a 3 week f./u with permission to use one of your holds?

## 2023-03-12 ENCOUNTER — Telehealth: Payer: Self-pay | Admitting: Pulmonary Disease

## 2023-03-12 NOTE — Telephone Encounter (Signed)
LVMTCB to schedule appointment

## 2023-03-12 NOTE — Telephone Encounter (Signed)
Patient states has new insurance Medicare B. States [facility] needs new order for oxygen. Needs more information for oxygen. Patient phone number is [phone].

## 2023-03-20 NOTE — Telephone Encounter (Signed)
Spoke with pt  He states [facility] needs qualifying o2 orders  He has appt with JD for 03/27/23  Advised him to be sure and keep this visit and will take care of o2 recert then  He verbalized understanding  Nothing further needed

## 2023-03-27 ENCOUNTER — Ambulatory Visit (INDEPENDENT_AMBULATORY_CARE_PROVIDER_SITE_OTHER): Payer: Medicare Other | Admitting: Pulmonary Disease

## 2023-03-27 ENCOUNTER — Encounter: Payer: Self-pay | Admitting: Pulmonary Disease

## 2023-03-27 VITALS — BP 136/82 | HR 96 | Ht 73.0 in | Wt 169.6 lb

## 2023-03-27 DIAGNOSIS — J449 Chronic obstructive pulmonary disease, unspecified: Secondary | ICD-10-CM

## 2023-03-27 DIAGNOSIS — G4734 Idiopathic sleep related nonobstructive alveolar hypoventilation: Secondary | ICD-10-CM | POA: Diagnosis not present

## 2023-03-27 DIAGNOSIS — J432 Centrilobular emphysema: Secondary | ICD-10-CM | POA: Diagnosis not present

## 2023-03-27 MED ORDER — ALBUTEROL SULFATE HFA 108 (90 BASE) MCG/ACT IN AERS: 2.0000 | INHALATION_SPRAY | Freq: Four times a day (QID) | RESPIRATORY_TRACT | 11 refills | Status: AC | PRN

## 2023-03-27 MED ORDER — IPRATROPIUM-ALBUTEROL 0.5-2.5 (3) MG/3ML IN SOLN: 3.0000 mL | RESPIRATORY_TRACT | 6 refills | Status: AC | PRN

## 2023-03-27 NOTE — Progress Notes (Signed)
 Synopsis: Referred in August 2023 for COPD by Karlynn Noel, MD  Subjective:   PATIENT ID: Travis Hicks: male DOB: 1950-04-14, MRN: [id number]  HPI  Chief Complaint  Patient presents with   Follow-up    Pt is still complaining of sob during exertion. Pt has questions ab mucinex     Travis Hicks is a 73 year old male, daily smoker with COPD/emphysema who returns to pulmonary clinic for COPD.  He experiences persistent shortness of breath, even while sitting, and a sensation of congestion. His breathing is described as 'kind of laboring'. He uses oxygen 24/7, although currently, he is using it only at night. His oxygen levels rarely drop below 88 during the day, except during acute exacerbations.  He has COPD and was previously prescribed Trelegy 200 mcg, which was later reduced to 100 mcg as he did not notice any difference in symptoms with the higher dose. He also uses albuterol , although his inhaler had expired, and there was some confusion with his prescriptions, leading to a switch back to a previous formulation. He is currently using a 5.0 mg dose of albuterol .  He quit smoking about a month and a half ago, having previously quit for shorter periods. He is confident in maintaining his cessation this time, noting that past relapses led to increased smoking intensity. He reports a reduction in cough and mucus production since quitting, although he still experiences some congestion and occasional mucus production, particularly in the morning and at night.  He mentions a change in his insurance to [employer] as primary and [employer] as secondary, which may require requalification for his oxygen equipment. He has been in contact with his oxygen supplier and the office regarding this issue.   OV 01/29/23 The patient, with a history of chronic obstructive pulmonary disease (COPD), presents with increased shortness of breath since Friday. She reports using nebulizers at  home and has noticed an increase in mucus production. She also reports a change in her albuterol  prescription, from a blue and white box to a purple and white box, which she has been using for the past few days. She has been feeling similar to when she had pneumonia previously and woke up with a fever and a burning sensation on her lower lip. She is currently on Trelegy one puff a day and Duoneb (albuterol , ipratropium) solution. She has been monitoring her oxygen levels at home, which have been lower than normal, dropping to the low eighties, requiring her to use supplemental oxygen 24/7. She also reports a recent increase in smoking for about two to three weeks, but has since quit again.  OV 04/27/22 He has been doing ok since last visit. ONO showed he had 5+ hours with SpO2 less than 88%. He was instructed to use 2L O2 at night. We reviewed the importance of this. He expressed concern with not being able to travel. He continues to have productive cough. He stopped smoking 1 week ago and is using nicotine  pouches.   OV 01/31/22 He saw Dr. McQuaid 12/22/21 for cough. Sputum were negative from that visit. He continues to have cough. It is not keeping him up at night. He denies wheezing. He continues to smoke 3-4 cigarettes per day.   Initial OV 10/20/21 Patient was recently admitted 8/8 to 8/12 for community acquired pneumonia and acute hypoxemic respiratory failure. He was discharged on arnuity and anoro inhaler therapy. He was on anoro therapy prior to admission.   Since discharge he  has only been using arnuity ellipta  daily as his pharmacy said he did not need both inhalers.   He reports his breathing has not improved much since discharge. He has productive cough. He was discharged on supplemental oxygen and is using 3L day/night. He has run out of albuterol  nebulizer solution. Prior to admission he was able to walk to his mail box and back but now he struggles to do that.  He is a retired engineer, drilling. He smokes 8 cigs per day. He was smoking 1 pack per day and has been smoking for 20+ years.  Past Medical History:  Diagnosis Date   Cataract    veru small per pt .   Emphysema of lung (HCC)    Glaucoma 2018   Overactive bladder      Family History  Problem Relation Age of Onset   Pancreatic cancer Father    Glaucoma Brother    Colon cancer Neg Hx    Rectal cancer Neg Hx    Stomach cancer Neg Hx    Esophageal cancer Neg Hx    Prostate cancer Neg Hx    Colon polyps Neg Hx      Social History   Socioeconomic History   Marital status: Married    Spouse name: Not on file   Number of children: 3   Years of education: Not on file   Highest education level: Not on file  Occupational History   Occupation: retired  Tobacco Use   Smoking status: Former    Current packs/day: 0.00    Types: Cigarettes    Quit date: 04/21/2022    Years since quitting: 0.9   Smokeless tobacco: Never   Tobacco comments:    Quit smoking Dec  Vaping Use   Vaping status: Never Used  Substance and Sexual Activity   Alcohol use: Not Currently   Drug use: No   Sexual activity: Yes  Other Topics Concern   Not on file  Social History Narrative   Not on file   Social Drivers of Health   Financial Resource Strain: Not on file  Food Insecurity: Not on file  Transportation Needs: Not on file  Physical Activity: Not on file  Stress: Not on file  Social Connections: Not on file  Intimate Partner Violence: Not on file     Allergies  Allergen Reactions   Donepezil      Stomach pain   Viagra [Sildenafil Citrate] Other (See Comments)    Unknown per Pt     Outpatient Medications Prior to Visit  Medication Sig Dispense Refill   azithromycin  (ZITHROMAX ) 250 MG tablet Take 1 tablet (250 mg total) by mouth daily. 6 tablet 0   b complex vitamins tablet Take 1 tablet by mouth daily. 100 tablet 3   Cholecalciferol (VITAMIN D3) 50 MCG (2000 UT) capsule Take 2 capsules (4,000 Units total) by  mouth daily. (Patient taking differently: Take 2,000 Units by mouth daily.) 100 capsule 3   Fluticasone-Umeclidin-Vilant (TRELEGY ELLIPTA ) 100-62.5-25 MCG/ACT AEPB Inhale 1 puff into the lungs daily. 28 each 5   guaiFENesin  (MUCINEX ) 600 MG 12 hr tablet Take 1 tablet (600 mg total) by mouth 2 (two) times daily. 60 tablet 1   latanoprost  (XALATAN ) 0.005 % ophthalmic solution Place 1 drop into the left eye at bedtime.     memantine  (NAMENDA ) 5 MG tablet TAKE 1 TABLET BY MOUTH TWICE A DAY 180 tablet 2   Multiple Vitamin (MULTIVITAMIN WITH MINERALS) TABS tablet Take 1 tablet by  mouth daily. 30 tablet 0   albuterol  (PROVENTIL ) (2.5 MG/3ML) 0.083% nebulizer solution Take 3 mLs (2.5 mg total) by nebulization every 4 (four) hours as needed for wheezing or shortness of breath. 75 mL 5   predniSONE  (DELTASONE ) 10 MG tablet Take 1 tablet (10 mg total) by mouth daily with breakfast. (Patient not taking: Reported on 03/27/2023) 8 tablet 0   predniSONE  (DELTASONE ) 10 MG tablet Take 4 tabs by mouth once daily x4 days, then 3 tabs x4 days, 2 tabs x4 days, 1 tab x4 days and stop. (Patient not taking: Reported on 03/27/2023) 40 tablet 0   azithromycin  (ZITHROMAX ) 250 MG tablet Take as directed (Patient not taking: Reported on 03/27/2023) 6 tablet 0   Fluticasone-Umeclidin-Vilant (TRELEGY ELLIPTA ) 200-62.5-25 MCG/ACT AEPB Inhale 200 each into the lungs daily. (Patient not taking: Reported on 03/27/2023)     No facility-administered medications prior to visit.   Review of Systems  Constitutional:  Negative for chills, fever, malaise/fatigue and weight loss.  HENT:  Negative for congestion, sinus pain and sore throat.   Eyes: Negative.   Respiratory:  Positive for cough, sputum production, shortness of breath and wheezing. Negative for hemoptysis.   Cardiovascular:  Negative for chest pain, palpitations, orthopnea, claudication and leg swelling.  Gastrointestinal:  Negative for abdominal pain, heartburn, nausea and vomiting.   Genitourinary: Negative.   Musculoskeletal:  Negative for joint pain and myalgias.  Skin:  Negative for rash.  Neurological:  Negative for weakness.  Endo/Heme/Allergies: Negative.   Psychiatric/Behavioral: Negative.     Objective:   Vitals:   03/27/23 1632  BP: 136/82  Pulse: 96  SpO2: 96%  Weight: 169 lb 9.6 oz (76.9 kg)  Height: 6' 1 (1.854 m)    Physical Exam Constitutional:      General: He is not in acute distress. HENT:     Head: Normocephalic and atraumatic.  Eyes:     Conjunctiva/sclera: Conjunctivae normal.  Cardiovascular:     Rate and Rhythm: Normal rate and regular rhythm.     Pulses: Normal pulses.     Heart sounds: Normal heart sounds. No murmur heard. Pulmonary:     Effort: Pulmonary effort is normal.     Breath sounds: Decreased air movement present. No wheezing, rhonchi or rales.  Musculoskeletal:     Right lower leg: No edema.     Left lower leg: No edema.  Skin:    General: Skin is warm and dry.  Neurological:     General: No focal deficit present.     Mental Status: He is alert.    CBC    Component Value Date/Time   WBC 5.8 12/14/2022 1557   RBC 4.87 12/14/2022 1557   HGB 15.3 12/14/2022 1557   HCT 47.3 12/14/2022 1557   PLT 385.0 12/14/2022 1557   MCV 97.2 12/14/2022 1557   MCH 33.2 09/30/2021 0514   MCHC 32.4 12/14/2022 1557   RDW 14.2 12/14/2022 1557   LYMPHSABS 1.4 12/14/2022 1557   MONOABS 0.7 12/14/2022 1557   EOSABS 0.1 12/14/2022 1557   BASOSABS 0.0 12/14/2022 1557      Latest Ref Rng & Units 12/14/2022    3:57 PM 01/09/2022    3:03 PM 10/10/2021    4:39 PM  BMP  Glucose 70 - 99 mg/dL 96  887  92   BUN 6 - 23 mg/dL 13  5  11    Creatinine 0.40 - 1.50 mg/dL 9.20  9.32  9.17   Sodium 135 - 145 mEq/L  136  140  138   Potassium 3.5 - 5.1 mEq/L 5.3 No hemolysis seen  3.4  4.9   Chloride 96 - 112 mEq/L 97  100  99   CO2 19 - 32 mEq/L 33  31  32   Calcium 8.4 - 10.5 mg/dL 89.4  9.5  9.8    Chest imaging: CT Chest  09/26/21 Mediastinum/Nodes: Enlarged right hilar lymph node measuring up to 1.7 cm. Prominent left hilar lymph nodes. Calcifications along the right left hilar lymph nodes may be sequelae of prior granulomatous disease. No enlarged mediastinal or axillary lymph nodes. Thyroid  gland, trachea, and esophagus demonstrate no significant findings.   Lungs/Pleura: Emphysematous changes. Diffuse bronchial wall thickening. Peribronchovascular tree-in-bud nodularity most prominent in the upper lung zones. Linear atelectasis versus scarring within the right middle lobe. No pulmonary nodule. No pulmonary mass. No pleural effusion. No pneumothorax.  PFT:    Latest Ref Rng & Units 12/14/2021    2:57 PM  PFT Results  FVC-Pre L 3.38   FVC-Predicted Pre % 71   FVC-Post L 3.15   FVC-Predicted Post % 66   Pre FEV1/FVC % % 45   Post FEV1/FCV % % 42   FEV1-Pre L 1.53   FEV1-Predicted Pre % 43   FEV1-Post L 1.34   DLCO uncorrected ml/min/mmHg 14.44   DLCO UNC% % 52   DLCO corrected ml/min/mmHg 14.44   DLCO COR %Predicted % 52   DLVA Predicted % 59   TLC L 9.46   TLC % Predicted % 127   RV % Predicted % 230     Labs:  Path:  Echo:  Heart Catheterization:  Assessment & Plan:   Centrilobular emphysema (HCC) - Plan: ipratropium-albuterol  (DUONEB) 0.5-2.5 (3) MG/3ML SOLN, albuterol  (VENTOLIN  HFA) 108 (90 Base) MCG/ACT inhaler  Nocturnal hypoxemia - Plan: Overnight Pulse Oximetry Study  Discussion: Theon Sobotka is a 73 year old male, daily smoker with COPD/emphysema who is referred to pulmonary clinic for hospital follow up.   Chronic Obstructive Pulmonary Disease (COPD) Severe COPD with recent exacerbation requiring increased steroid use. Patient reports ongoing dyspnea and congestion. Noted cessation of smoking for approximately 1.5 months. Currently on Trelegy 100 daily and Albuterol  as needed. -Continue Trelegy 100 daily. -Prescribe DuoNeb solution for nebulizer use as  needed. -Prescribe Albuterol  inhaler. -Encourage continued smoking cessation. -Consider more advanced therapies if symptoms persist after smoking cessation.  Oxygen Use Patient currently using oxygen at night. Recent change in insurance requires re-evaluation for oxygen need. -Perform walk test today to assess daytime oxygen levels. -Order overnight oxygen test on room air. -Update oxygen order based on test results.  General Health Maintenance -Discussed prognosis and disease progression of severe COPD. Emphasized importance of smoking cessation and consistent use of inhalers. -Schedule follow-up visit to reassess symptoms and oxygen needs.  Follow up in 6 months  Dorn Chill, MD [facility] Office: [phone]   Current Outpatient Medications:    albuterol  (VENTOLIN  HFA) 108 (90 Base) MCG/ACT inhaler, Inhale 2 puffs into the lungs every 6 (six) hours as needed for wheezing or shortness of breath., Disp: 8 g, Rfl: 11   azithromycin  (ZITHROMAX ) 250 MG tablet, Take 1 tablet (250 mg total) by mouth daily., Disp: 6 tablet, Rfl: 0   b complex vitamins tablet, Take 1 tablet by mouth daily., Disp: 100 tablet, Rfl: 3   Cholecalciferol (VITAMIN D3) 50 MCG (2000 UT) capsule, Take 2 capsules (4,000 Units total) by mouth daily. (Patient taking differently: Take 2,000  Units by mouth daily.), Disp: 100 capsule, Rfl: 3   Fluticasone-Umeclidin-Vilant (TRELEGY ELLIPTA ) 100-62.5-25 MCG/ACT AEPB, Inhale 1 puff into the lungs daily., Disp: 28 each, Rfl: 5   guaiFENesin  (MUCINEX ) 600 MG 12 hr tablet, Take 1 tablet (600 mg total) by mouth 2 (two) times daily., Disp: 60 tablet, Rfl: 1   ipratropium-albuterol  (DUONEB) 0.5-2.5 (3) MG/3ML SOLN, Take 3 mLs by nebulization every 4 (four) hours as needed., Disp: 360 mL, Rfl: 6   latanoprost  (XALATAN ) 0.005 % ophthalmic solution, Place 1 drop into the left eye at bedtime., Disp: , Rfl:    memantine  (NAMENDA ) 5 MG tablet, TAKE 1 TABLET BY  MOUTH TWICE A DAY, Disp: 180 tablet, Rfl: 2   Multiple Vitamin (MULTIVITAMIN WITH MINERALS) TABS tablet, Take 1 tablet by mouth daily., Disp: 30 tablet, Rfl: 0   predniSONE  (DELTASONE ) 10 MG tablet, Take 1 tablet (10 mg total) by mouth daily with breakfast. (Patient not taking: Reported on 03/27/2023), Disp: 8 tablet, Rfl: 0   predniSONE  (DELTASONE ) 10 MG tablet, Take 4 tabs by mouth once daily x4 days, then 3 tabs x4 days, 2 tabs x4 days, 1 tab x4 days and stop. (Patient not taking: Reported on 03/27/2023), Disp: 40 tablet, Rfl: 0

## 2023-03-27 NOTE — Patient Instructions (Addendum)
 Continue trelegy 1 puff daily - rinse mouth out after each use  Use duonebs every 4 hours as needed  Use albuterol  inhaler 1-2 puffs every 4 hours as needed  Congratulations on quitting smoking, recommend working on staying off cigarettes for good to help prevent your lung function from worsening  We will check an overnight oxygen test  Follow up in 6 months, call sooner if needed

## 2023-03-28 ENCOUNTER — Telehealth: Payer: Self-pay | Admitting: Pulmonary Disease

## 2023-03-28 NOTE — Telephone Encounter (Signed)
 Patient is aware that an order was placed to [facility] on 03/27/2023. He voiced his understanding. Nothing further needed.

## 2023-03-30 ENCOUNTER — Telehealth: Payer: Self-pay

## 2023-03-30 ENCOUNTER — Other Ambulatory Visit (HOSPITAL_COMMUNITY): Payer: Self-pay

## 2023-03-30 NOTE — Telephone Encounter (Signed)
 Pharmacy Patient Advocate Encounter   Received notification from CoverMyMeds that prior authorization for Ipratropium-Albuterol  0.5-2.5 (3)MG/3ML solution is required/requested.   Insurance verification completed.   The patient is insured through [facility] .   Per test claim: PA required; PA submitted to above mentioned insurance via CoverMyMeds Key/confirmation #/EOC [id number] Status is pending

## 2023-03-31 ENCOUNTER — Other Ambulatory Visit: Payer: Self-pay | Admitting: Pulmonary Disease

## 2023-03-31 DIAGNOSIS — J432 Centrilobular emphysema: Secondary | ICD-10-CM

## 2023-04-02 NOTE — Telephone Encounter (Signed)
 Lm for [facility].

## 2023-04-02 NOTE — Telephone Encounter (Signed)
 Pharmacy Patient Advocate Encounter  Received notification from [facility] [employer] that Prior Authorization for Ipratropium-Albuterol  0.5-2.5 (3)MG/3ML solution has been DENIED.  Full denial letter will be uploaded to the media tab. See denial reason below.  We have determined that this drug is not covered under your Medicare Part D prescription drug benefit.  However, this drug is covered under your employer group enhanced benefit or Medicare Part B. We have approved coverage for this drug under the enhanced benefit portion of you benefit. This approval authorizes your coverage to continue through 03-29-2024.   PA #/Case ID/Reference #: [id number]

## 2023-04-08 ENCOUNTER — Encounter: Payer: Self-pay | Admitting: Pulmonary Disease

## 2023-04-18 ENCOUNTER — Telehealth: Payer: Self-pay | Admitting: Pulmonary Disease

## 2023-04-18 NOTE — Telephone Encounter (Signed)
 ONO results  Patient spent 47 minutes with SpO2 less than 88%. Recommend patient use 2L of oxygen with sleep. Study done for oxygen recertification.  Thanks, JD

## 2023-04-20 NOTE — Telephone Encounter (Signed)
 I called and spoke with pt. Pt verbalized understanding. Nfn

## 2023-04-20 NOTE — Telephone Encounter (Signed)
 Please make sure pt has an appointment with Francine Graven in August

## 2023-06-27 ENCOUNTER — Other Ambulatory Visit: Payer: Self-pay | Admitting: Pulmonary Disease

## 2023-06-27 DIAGNOSIS — J432 Centrilobular emphysema: Secondary | ICD-10-CM

## 2023-06-29 NOTE — Telephone Encounter (Signed)
 Copied from CRM [id number]. Topic: Clinical - Medical Advice >> Jun 28, 2023  1:27 PM Travis Hicks wrote: Reason for CRM: Patient is requesting some advice or tips as now that he has COPD when he showers and runs the water over his head he runs out of breath and take him some time to regain his normal breathing.  Called patient,states he has had more shortness of breath mainly in the shower says the steam from the shower is causing him to take his breath away and wants more tips or advice on how to better manage everything states he wears 2liters of oxygen and still feels shortness of breath with exertion,uses his inhalers and has to use his albuterol  twice in the past week and uses his nebulizer at the very least once a day.Please advice

## 2023-07-02 NOTE — Telephone Encounter (Signed)
 Spoke w/ PT verbalized understanding, no further action needed

## 2023-07-20 ENCOUNTER — Other Ambulatory Visit: Payer: Self-pay | Admitting: Pulmonary Disease

## 2023-08-28 ENCOUNTER — Telehealth: Payer: Self-pay | Admitting: Pulmonary Disease

## 2023-08-28 DIAGNOSIS — J441 Chronic obstructive pulmonary disease with (acute) exacerbation: Secondary | ICD-10-CM

## 2023-08-28 NOTE — Telephone Encounter (Signed)
 PT called upset no one called to set a FU for him. Adv sched just came out and we have hundreds of PT's to call. He said he is SOB in the AM usually and would like to see if Dr. Damien call something in for him before his Sept appt. to offer relief. Please call.

## 2023-08-29 MED ORDER — PREDNISONE 10 MG PO TABS: 40.0000 mg | ORAL_TABLET | Freq: Every day | ORAL | 0 refills | Status: DC

## 2023-08-29 MED ORDER — AZITHROMYCIN 250 MG PO TABS: ORAL_TABLET | ORAL | 0 refills | Status: DC

## 2023-08-29 NOTE — Telephone Encounter (Signed)
 Spoke w/ PT Rx at pharmacy    NFN

## 2023-08-29 NOTE — Telephone Encounter (Signed)
 Prednisone  and Zpak sent in. If this does not help, we can work him in sooner for an acute visit.  JD

## 2023-10-30 ENCOUNTER — Ambulatory Visit (INDEPENDENT_AMBULATORY_CARE_PROVIDER_SITE_OTHER): Admitting: Adult Health

## 2023-10-30 ENCOUNTER — Encounter: Payer: Self-pay | Admitting: Adult Health

## 2023-10-30 VITALS — BP 154/99 | HR 106 | Temp 97.6°F | Ht 73.0 in | Wt 165.8 lb

## 2023-10-30 DIAGNOSIS — J449 Chronic obstructive pulmonary disease, unspecified: Secondary | ICD-10-CM | POA: Diagnosis not present

## 2023-10-30 DIAGNOSIS — J9611 Chronic respiratory failure with hypoxia: Secondary | ICD-10-CM | POA: Diagnosis not present

## 2023-10-30 DIAGNOSIS — E440 Moderate protein-calorie malnutrition: Secondary | ICD-10-CM

## 2023-10-30 DIAGNOSIS — F17210 Nicotine dependence, cigarettes, uncomplicated: Secondary | ICD-10-CM | POA: Diagnosis not present

## 2023-10-30 DIAGNOSIS — Z720 Tobacco use: Secondary | ICD-10-CM

## 2023-10-30 NOTE — Patient Instructions (Addendum)
 Continue on Trelegy 1 puff daily, rinse after use.  Albuterol  inhaler or neb As needed   Continue on Oxygen 2l/m  High protein diet  Continue on to stay off the smokes  Refer to Lung cancer CT chest screening program.  Follow up with Dr. Kara with 6 months and As needed

## 2023-10-30 NOTE — Progress Notes (Signed)
 @Patient  ID: Travis Hicks, male    DOB: 12-17-1950, 73 y.o.   MRN: [id number]  Chief Complaint  Patient presents with   Follow-up    6 month f/u emphysema    Referring provider: Garald Karlynn GAILS, MD  HPI: 73 yo male former smoker followed for COPD with Emphysema and Chronic Respiratory Failure on O2   TEST/EVENTS :  CT chest September 26, 2021 shows emphysematous changes, negative for PE, tree-in-bud nodularity predominant in the upper lung zones right hilar lymphadenopathy questionable reactive  PFT December 14, 2021 FEV1 43%, ratio 45, FVC 71%, DLCO is 52%  10/30/2023 Follow up : COPD with Emphysema and O2 RF  Discussed the use of AI scribe software for clinical note transcription with the patient, who gave verbal consent to proceed.  History of Present Illness Travis Hicks is a 73 year old male with COPD and emphysema who presents for a six-month checkup.  Says overall breathing is doing about the same.  He gets short of breath with heavy activities.  Remains on Trelegy inhaler daily He is on oxygen therapy, using two liters at night and as needed during the day when he feels weak or short of breath. He quit smoking last year but had a brief relapse three months ago, smoking a pack over a week and a half. He has not smoked since. He uses Trelegy once daily. He also has an albuterol  inhaler and a nebulizer for which he needs a new mask and filter.   He has a history of smoking for approximately 60 years, starting at age 48 or 69, on average three fourths of a pack daily and smoked up to a pack a day after retirement.  No history of cancer. He tries to stay active despite his symptoms.  Declines vaccines.  Current weight is 165 pounds with a BMI 21.  Says he does not have a big appetite.   Allergies  Allergen Reactions   Donepezil      Stomach pain   Viagra [Sildenafil Citrate] Other (See Comments)    Unknown per Pt    Immunization History  Administered Date(s)  Administered   PFIZER(Purple Top)SARS-COV-2 Vaccination 04/04/2019, 04/29/2019, 12/02/2019   PNEUMOCOCCAL CONJUGATE-20 01/09/2022    Past Medical History:  Diagnosis Date   Cataract    veru small per pt .   Emphysema of lung (HCC)    Glaucoma 2018   Overactive bladder     Tobacco History: Social History   Tobacco Use  Smoking Status Former   Current packs/day: 0.00   Types: Cigarettes   Quit date: 04/21/2022   Years since quitting: 1.5  Smokeless Tobacco Never  Tobacco Comments   Quit smoking Dec   Counseling given: Not Answered Tobacco comments: Quit smoking Dec   Outpatient Medications Prior to Visit  Medication Sig Dispense Refill   albuterol  (PROVENTIL ) (2.5 MG/3ML) 0.083% nebulizer solution TAKE 3 ML (2.5 MG TOTAL) BY NEBULIZATION EVERY 4 HOURS AS NEEDED FOR WHEEZING OR SHORTNESS OF BREATH 375 mL 1   albuterol  (VENTOLIN  HFA) 108 (90 Base) MCG/ACT inhaler Inhale 2 puffs into the lungs every 6 (six) hours as needed for wheezing or shortness of breath. 8 g 11   b complex vitamins tablet Take 1 tablet by mouth daily. 100 tablet 3   Cholecalciferol (VITAMIN D3) 50 MCG (2000 UT) capsule Take 2 capsules (4,000 Units total) by mouth daily. 100 capsule 3   guaiFENesin  (MUCINEX ) 600 MG 12 hr tablet Take 1 tablet (600 mg total)  by mouth 2 (two) times daily. 60 tablet 1   ipratropium-albuterol  (DUONEB) 0.5-2.5 (3) MG/3ML SOLN Take 3 mLs by nebulization every 4 (four) hours as needed. 360 mL 6   latanoprost  (XALATAN ) 0.005 % ophthalmic solution Place 1 drop into the left eye at bedtime.     Multiple Vitamin (MULTIVITAMIN WITH MINERALS) TABS tablet Take 1 tablet by mouth daily. 30 tablet 0   TRELEGY ELLIPTA  100-62.5-25 MCG/ACT AEPB TAKE 1 PUFF BY MOUTH EVERY DAY 60 each 5   azithromycin  (ZITHROMAX ) 250 MG tablet Take as directed (Patient not taking: Reported on 10/30/2023) 6 tablet 0   memantine  (NAMENDA ) 5 MG tablet TAKE 1 TABLET BY MOUTH TWICE A DAY (Patient not taking: Reported on  10/30/2023) 180 tablet 2   predniSONE  (DELTASONE ) 10 MG tablet Take 4 tablets (40 mg total) by mouth daily with breakfast. (Patient not taking: Reported on 10/30/2023) 20 tablet 0   No facility-administered medications prior to visit.     Review of Systems:   Constitutional:   No  weight loss, night sweats,  Fevers, chills, +fatigue, or  lassitude.  HEENT:   No headaches,  Difficulty swallowing,  Tooth/dental problems, or  Sore throat,                No sneezing, itching, ear ache, nasal congestion, post nasal drip,   CV:  No chest pain,  Orthopnea, PND, swelling in lower extremities, anasarca, dizziness, palpitations, syncope.   GI  No heartburn, indigestion, abdominal pain, nausea, vomiting, diarrhea, change in bowel habits, loss of appetite, bloody stools.   Resp: No chest wall deformity  Skin: no rash or lesions.  GU: no dysuria, change in color of urine, no urgency or frequency.  No flank pain, no hematuria   MS:  No joint pain or swelling.  No decreased range of motion.  No back pain.    Physical Exam  BP (!) 154/99   Pulse (!) 106   Temp 97.6 F (36.4 C) (Oral)   Ht 6' 1 (1.854 m)   Wt 165 lb 12.8 oz (75.2 kg)   SpO2 92% Comment: on 2L POC  BMI 21.87 kg/m   GEN: A/Ox3; pleasant , NAD, thin and elderly   HEENT:  [place]/AT,  NOSE-clear, THROAT-clear, no lesions, no postnasal drip or exudate noted.   NECK:  Supple w/ fair ROM; no JVD; normal carotid impulses w/o bruits; no thyromegaly or nodules palpated; no lymphadenopathy.    RESP  Clear  P & A; w/o, wheezes/ rales/ or rhonchi. no accessory muscle use, no dullness to percussion  CARD:  RRR, no m/r/g, no peripheral edema, pulses intact, no cyanosis or clubbing.  GI:   Soft & nt; nml bowel sounds; no organomegaly or masses detected.   Musco: Warm bil, no deformities or joint swelling noted.   Neuro: alert, no focal deficits noted.    Skin: Warm, no lesions or rashes    Lab Results:    BNP   ProBNP No  results found for: PROBNP  Imaging: No results found.  Administration History     None          Latest Ref Rng & Units 12/14/2021    2:57 PM  PFT Results  FVC-Pre L 3.38   FVC-Predicted Pre % 71   FVC-Post L 3.15   FVC-Predicted Post % 66   Pre FEV1/FVC % % 45   Post FEV1/FCV % % 42   FEV1-Pre L 1.53   FEV1-Predicted Pre % 43  FEV1-Post L 1.34   DLCO uncorrected ml/min/mmHg 14.44   DLCO UNC% % 52   DLCO corrected ml/min/mmHg 14.44   DLCO COR %Predicted % 52   DLVA Predicted % 59   TLC L 9.46   TLC % Predicted % 127   RV % Predicted % 230     No results found for: NITRICOXIDE      Assessment & Plan:   No problem-specific Assessment & Plan notes found for this encounter.  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 Symptoms include dyspnea on exertion and reduced activity tolerance. Continue Trelegy once daily, rinsing mouth after use. Use albuterol  inhaler and nebulizer with albuterol  as needed. Encourage a high protein diet to prevent muscle mass loss.  Declines vaccines . educate on infection prevention, including hand washing and avoiding crowds during outbreaks.  Referred to the lung cancer screening program  Chronic respiratory failure-Long-term supplemental oxygen therapy   He requires supplemental oxygen, particularly at night and during exertion, to maintain adequate oxygen saturation. Oxygen saturation levels drop during physical activity, necessitating careful monitoring. Continue using supplemental oxygen at 2 liters, especially at night and during exertion. Monitor oxygen saturation with a pulse oximeter during activities. Order replacement mask and filter for the nebulizer.  History of tobacco use   He has an extensive smoking history of approximately 60 years, with recent cessation. A brief relapse was noted, but he is  currently abstinent. Smoking cessation is crucial for managing COPD and preventing further lung damage. Continue to abstain from smoking and reinforce the benefits of smoking cessation on lung health.  Refer to the lung cancer CT chest screening program  Moderate protein calorie malnutrition BMI 21 -, and muscle mass loss is a concern due to COPD with emphysema. Encourage a high protein diet to support muscle mass. Consider nutritional supplements like Ensure or Boost to increase protein intake.  Plan  Patient Instructions  Continue on Trelegy 1 puff daily, rinse after use.  Albuterol  inhaler or neb As needed   Continue on Oxygen 2l/m  High protein diet  Continue on to stay off the smokes  Refer to Lung cancer CT chest screening program.  Follow up with Dr. Kara with 6 months and As needed        Madelin Stank, NP 10/30/2023

## 2023-10-31 ENCOUNTER — Telehealth: Payer: Self-pay

## 2023-10-31 DIAGNOSIS — J449 Chronic obstructive pulmonary disease, unspecified: Secondary | ICD-10-CM

## 2023-10-31 NOTE — Telephone Encounter (Signed)
 Copied from CRM [id number]. Topic: Clinical - Medical Advice >> Oct 31, 2023  1:02 PM Leila BROCKS wrote: Patient [phone] states someone named Rocky called patient today at 8:29 am regarding having questions about patient nebulizer. Per CAL, CMA already spoke with patient already, send a crm to clinical. Please call back.    Called and spoke with the patient again, pt states he was contacted by Rocky with questions about Nebulizer. Pt has never had a nebulizer machine.   I am placing a new neb order per referral note.  Patient has never gotten a nebulizer from [facility] so don't know that our supplies would fit his current machine. If patient needs a nebulizer machine you can send an order for new machine.  Routing to [facility], Rocky did you call this patient? I do not see anything in the chart where you called.

## 2023-10-31 NOTE — Telephone Encounter (Signed)
 Copied from CRM [id number]. Topic: Clinical - Medical Advice >> Oct 31, 2023  8:46 AM Isabell A wrote: Reason for CRM: Patient is requesting a callback from Rocky - states  he has questions about his nebulizer.    Callback number: [phone]  Called and spoke with the patient, he states someone called him regarding his nebulizer medication. I do not see any documentation of patient being contacted. Pt was asleep when I called and stated he didn't remember. I advised pt to use albuterol  inhaler or neb as needed and to call back if he had any other concerns. Nfn

## 2023-10-31 NOTE — Telephone Encounter (Signed)
 Will send as soon as it is signed.

## 2023-11-01 NOTE — Telephone Encounter (Signed)
 Order has been sent. NFN

## 2023-11-06 ENCOUNTER — Telehealth: Payer: Self-pay | Admitting: Acute Care

## 2023-11-06 DIAGNOSIS — Z122 Encounter for screening for malignant neoplasm of respiratory organs: Secondary | ICD-10-CM

## 2023-11-06 DIAGNOSIS — Z87891 Personal history of nicotine dependence: Secondary | ICD-10-CM

## 2023-11-06 DIAGNOSIS — F17210 Nicotine dependence, cigarettes, uncomplicated: Secondary | ICD-10-CM

## 2023-11-06 NOTE — Telephone Encounter (Signed)
 Lung Cancer Screening Narrative/Criteria Questionnaire (Cigarette Smokers Only- No Cigars/Pipes/vapes)   Travis Hicks   SDMV:11/19/23 at 130p/Kristen                                           [birth year]              LDCT: 11/22/23 at 2p[address]    73 y.o.   Phone: [phone]  Lung Screening Narrative (confirm age 54-77 yrs Medicare / 50-80 yrs Private pay insurance)   Insurance information:BCBS   Referring Provider:Parrett   This screening involves an initial phone call with a team member from our program. It is called a shared decision making visit. The initial meeting is required by insurance and Medicare to make sure you understand the program. This appointment takes about 15-20 minutes to complete. The CT scan will completed at a separate date/time. This scan takes about 5-10 minutes to complete and you may eat and drink before and after the scan.  Criteria questions for Lung Cancer Screening:   Are you a current or former smoker? Current Age began smoking: 14y   If you are a former smoker, what year did you quit smoking? NA   To calculate your smoking history, I need an accurate estimate of how many packs of cigarettes you smoked per day and for how many years. (Not just the number of PPD you are now smoking)   Years smoking 59 x Packs per day 3/4p = Pack years 44   (at least 20 pack yrs)   (Make sure they understand that we need to know how much they have smoked in the past, not just the number of PPD they are smoking now)  Do you have a personal history of cancer?  No    Do you have a family history of cancer? Yes  (cancer type and and relative) father/pancrease  Are you coughing up blood?  No  Have you had unexplained weight loss of 15 lbs or more in the last 6 months? No  It looks like you meet all criteria.     Additional information: N/A

## 2023-11-19 ENCOUNTER — Telehealth: Payer: Self-pay | Admitting: *Deleted

## 2023-11-19 ENCOUNTER — Ambulatory Visit: Admitting: *Deleted

## 2023-11-19 DIAGNOSIS — F17210 Nicotine dependence, cigarettes, uncomplicated: Secondary | ICD-10-CM

## 2023-11-19 NOTE — Patient Instructions (Signed)

## 2023-11-19 NOTE — Telephone Encounter (Signed)
 Concerns addressed today during video visit.  [name], RN   11/19/23     Pt states he would like to postpone lung CT for now- he would like to speak with his doctor prior to going through with scan.  SDMV completed so if he did decide to proceed with scan in the future he would be ready.

## 2023-11-19 NOTE — Progress Notes (Unsigned)
 Virtual Visit via Video Note  I connected with Travis Hicks on 11/19/23 at  1:30 PM EDT by a video enabled telemedicine application and verified that I am speaking with the correct person using two identifiers.  Location: Patient: in home Provider: [address], [place], [place], [address]    Shared Decision Making Visit Lung Cancer Screening Program [phone])   Eligibility: Age 73 y.o. Pack Years Smoking History Calculation 44 (# packs/per year x # years smoked) Recent History of coughing up blood  no Unexplained weight loss? no ( >Than 15 pounds within the last 6 months ) Prior History Lung / other cancer no (Diagnosis within the last 5 years already requiring surveillance chest CT Scans). Smoking Status Current Smoker Former Smokers: Years since quit:  NA  Quit Date: NA  Visit Components: Discussion included one or more decision making aids. yes Discussion included risk/benefits of screening. yes Discussion included potential follow up diagnostic testing for abnormal scans. yes Discussion included meaning and risk of over diagnosis. yes Discussion included meaning and risk of False Positives. yes Discussion included meaning of total radiation exposure. yes  Counseling Included: Importance of adherence to annual lung cancer LDCT screening. yes Impact of comorbidities on ability to participate in the program. yes Ability and willingness to under diagnostic treatment. yes  Smoking Cessation Counseling: Current Smokers:  Discussed importance of smoking cessation. yes Information about tobacco cessation classes and interventions provided to patient. yes Patient provided with ticket for LDCT Scan. yes Symptomatic Patient. no  Counseling NA Diagnosis Code: Tobacco Use Z72.0 Asymptomatic Patient yes  Counseling (Intermediate counseling: > three minutes counseling) H9563  Counseled patient 4 minutes regarding tobacco use.   Former Smokers:  Discussed the  importance of maintaining cigarette abstinence. yes Diagnosis Code: Personal History of Nicotine  Dependence. S12.108 Information about tobacco cessation classes and interventions provided to patient. Yes Patient provided with ticket for LDCT Scan. yes Written Order for Lung Cancer Screening with LDCT placed in Epic. Yes (CT Chest Lung Cancer Screening Low Dose W/O CM) [id number] Z12.2-Screening of respiratory organs Z87.891-Personal history of nicotine  dependence   [name], RN  11/19/23   Pt states he would like to postpone lung CT for now- he would like to speak with his doctor prior to going through with scan.  SDMV completed so if he did decide to proceed with scan in the future he would be ready.

## 2023-11-21 ENCOUNTER — Telehealth: Payer: Self-pay | Admitting: *Deleted

## 2023-11-21 NOTE — Telephone Encounter (Signed)
 Addressed via televisit. NFN

## 2023-11-22 ENCOUNTER — Other Ambulatory Visit

## 2023-12-06 ENCOUNTER — Ambulatory Visit: Payer: Self-pay

## 2023-12-06 NOTE — Telephone Encounter (Signed)
 FYI Only or Action Required?: FYI only for provider.  Patient was last seen in primary care on 12/14/2022 by Plotnikov, Karlynn GAILS, MD.  Called Nurse Triage reporting Shortness of Breath.  Symptoms began several months ago.  Interventions attempted: Rest, hydration, or home remedies.  Symptoms are: gradually worsening.  Triage Disposition: See HCP Within 4 Hours (Or PCP Triage)  Patient/caregiver understands and will follow disposition?: No, refuses disposition  Copied from CRM #[id number]. Topic: Clinical - Red Word Triage >> Dec 06, 2023  1:34 PM Dedra B wrote: [facility] that prompted transfer to Nurse Triage: Pt called for physical appt but stated that his SOB  has gotten worse. Pt also has rash on L arm that burns. Warm transfer to NT. Reason for Disposition  [1] Longstanding difficulty breathing (e.g., CHF, COPD, emphysema) AND [2] WORSE than normal  Answer Assessment - Initial Assessment Questions Pt originally called to get scheduled for physical, tx to RN triage for increased SOB over past several months. Has hx of COPD. Pt declines recs to be seen today, asking to have appt scheduled for tomorrow. Acute visit scheduled for tomorrow and advised to call back for worsening symptoms.  1. RESPIRATORY STATUS: Describe your breathing? (e.g., wheezing, shortness of breath, unable to speak, severe coughing)      Significantly increased SOB with exertion. No wheezing.   2. ONSET: When did this breathing problem begin?      3-4 months ago  3. PATTERN Does the difficult breathing come and go, or has it been constant since it started?      Constant  4. SEVERITY: How bad is your breathing? (e.g., mild, moderate, severe)      Moderate SOB  5. RECURRENT SYMPTOM: Have you had difficulty breathing before? If Yes, ask: When was the last time? and What happened that time?      Has hx of COPD  6. CARDIAC HISTORY: Do you have any history of heart disease? (e.g., heart attack,  angina, bypass surgery, angioplasty)      No  7. LUNG HISTORY: Do you have any history of lung disease?  (e.g., pulmonary embolus, asthma, emphysema)     COPD  8. CAUSE: What do you think is causing the breathing problem?      Unsure  9. OTHER SYMPTOMS: Do you have any other symptoms? (e.g., chest pain, cough, dizziness, fever, runny nose)     No. Denies fever or CP.  10. O2 SATURATION MONITOR:  Do you use an oxygen saturation monitor (pulse oximeter) at home? If Yes, ask: What is your reading (oxygen level) today? What is your usual oxygen saturation reading? (e.g., 95%)       90-92% today. Wears O2 at night, 2L. Wears portable O2 intermittently throughout the day for episodes of exertion, has had to increase from 2L to 4L for episodic SOB within the past 3-4 months.  Protocols used: Breathing Difficulty-A-AH

## 2023-12-07 ENCOUNTER — Encounter: Payer: Self-pay | Admitting: Family Medicine

## 2023-12-07 ENCOUNTER — Ambulatory Visit (INDEPENDENT_AMBULATORY_CARE_PROVIDER_SITE_OTHER): Admitting: Family Medicine

## 2023-12-07 VITALS — BP 148/84 | HR 104 | Temp 97.3°F | Ht 73.0 in | Wt 164.8 lb

## 2023-12-07 DIAGNOSIS — J9611 Chronic respiratory failure with hypoxia: Secondary | ICD-10-CM

## 2023-12-07 DIAGNOSIS — F17211 Nicotine dependence, cigarettes, in remission: Secondary | ICD-10-CM | POA: Diagnosis not present

## 2023-12-07 DIAGNOSIS — L309 Dermatitis, unspecified: Secondary | ICD-10-CM | POA: Diagnosis not present

## 2023-12-07 DIAGNOSIS — J432 Centrilobular emphysema: Secondary | ICD-10-CM

## 2023-12-07 DIAGNOSIS — R0602 Shortness of breath: Secondary | ICD-10-CM

## 2023-12-07 NOTE — Patient Instructions (Signed)
 Start wearing oxygen 2L continuously day and night.  Schedule a f/u appt with your pulmonologist and PCP.  You can use over the counter cortisone cream on your elbow.

## 2023-12-07 NOTE — Progress Notes (Signed)
 Established Patient Office Visit   Subjective  Patient ID: Travis Hicks, male    DOB: 29-Jul-1950  Age: 73 y.o. MRN: [id number]  Chief Complaint  Patient presents with   Shortness of Breath    Worse with any excretion    Rash    On left arm/elbow     Patient is a 73 year old male followed by Dr. Garald and seen for ongoing concern.  Pt states he wanted to schedule a physical but was advised to make this appt.  Patient endorses SOB x several months.  Followed by pulmonology, seen 10/2023.  Pt states he was put on abx and steroids but did not notice much difference a few months back.  Pt using nebulizer QID at home and inhalers.  Wearing 2L O2 at night.  At times has to use 2L O2 during the day for pO2 84-87%.  Denies cough, fever, sputum production, rhinorrhea, CP, or any sick sx.  Pt does mentions buying a pack of cigarettes after feeling anxious about having a CT for lung cancer screening.  Did not have CT as concerned about possible s/e.  Rash on b/l elbows x months.  Started as fine pruritic bumps then skin turned dark in color.  Tried aloe.    Patient Active Problem List   Diagnosis Date Noted   Chronic respiratory failure with hypoxia (HCC) 10/31/2021   Urinary urgency 10/31/2021   Alcohol use disorder in remission 10/10/2021   Protein-calorie malnutrition, severe 09/28/2021   CAP (community acquired pneumonia) 09/26/2021   Dementia without behavioral disturbance (HCC) 09/26/2021   Acute respiratory failure with hypoxia (HCC) 09/26/2021   Sepsis (HCC) 09/26/2021   History of ETOH abuse 09/26/2021   AKI (acute kidney injury) 09/26/2021   Hypokalemia 03/09/2021   COPD mixed type (HCC) 03/09/2021   COPD exacerbation (HCC) 02/07/2021   Syncope 02/07/2021   History of COVID-19 02/07/2021   Skin lesions 11/09/2020   Shingles 07/27/2020   Alcoholic hepatitis (HCC) 04/15/2020   Ill-fitting dentures 03/07/2020   Malnutrition 03/05/2020   Memory loss 03/05/2020   Weight loss  01/14/2019   Blood pressure elevated without history of HTN 08/15/2018   Panlobular emphysema (HCC) 01/31/2017   Influenza-like illness 01/31/2017   Low back pain 10/09/2016   Glaucoma 08/21/2016   Erectile dysfunction 08/10/2015   Incontinence of urine 10/22/2013   Epididymitis 07/17/2013   Epididymitis, left 05/06/2013   Vitamin D  deficiency 05/06/2013   E. coli UTI (urinary tract infection) 04/29/2013   Wart viral 01/14/2013   Alcoholism (HCC) 01/14/2013   Lower abdominal pain 01/14/2013   Well adult exam 05/23/2011   Tobacco abuse 12/07/2008   Cerumen impaction 12/02/2007   Abnormal CT of the abdomen 11/27/2006   Hematuria 11/27/2006   IMPOTENCE 11/27/2006   GERD 11/26/2006   History of colonic polyps 11/26/2006   Past Medical History:  Diagnosis Date   Cataract    veru small per pt .   Emphysema of lung (HCC)    Glaucoma 2018   Overactive bladder    Past Surgical History:  Procedure Laterality Date   COLONOSCOPY     10-10-02,11-21-2005   DENTAL SURGERY     had 14 teeth pulled    HAND SURGERY Right    POLYPECTOMY     10-10-02,11-21-05   Social History   Tobacco Use   Smoking status: Some Days    Current packs/day: 0.00    Types: Cigarettes    Last attempt to quit: 04/21/2022    Years  since quitting: 1.6   Smokeless tobacco: Never   Tobacco comments:    Quit smoking Dec  Vaping Use   Vaping status: Never Used  Substance Use Topics   Alcohol use: Not Currently   Drug use: No   Family History  Problem Relation Age of Onset   Pancreatic cancer Father    Glaucoma Brother    Colon cancer Neg Hx    Rectal cancer Neg Hx    Stomach cancer Neg Hx    Esophageal cancer Neg Hx    Prostate cancer Neg Hx    Colon polyps Neg Hx    Allergies  Allergen Reactions   Donepezil      Stomach pain   Viagra [Sildenafil Citrate] Other (See Comments)    Unknown per Pt    ROS Negative unless stated above    Objective:     BP (!) 148/84 (BP Location: Right Arm,  Patient Position: Sitting, Cuff Size: Large)   Pulse (!) 104   Temp (!) 97.3 F (36.3 C) (Temporal)   Ht 6' 1 (1.854 m)   Wt 164 lb 12.8 oz (74.8 kg)   SpO2 94% Comment: wtih 2L of O2  BMI 21.74 kg/m  BP Readings from Last 3 Encounters:  12/07/23 (!) 148/84  10/30/23 (!) 154/99  03/27/23 136/82   Wt Readings from Last 3 Encounters:  12/07/23 164 lb 12.8 oz (74.8 kg)  10/30/23 165 lb 12.8 oz (75.2 kg)  03/27/23 169 lb 9.6 oz (76.9 kg)      Physical Exam Constitutional:      General: He is not in acute distress.    Appearance: Normal appearance. He is underweight. He is not ill-appearing, toxic-appearing or diaphoretic.     Interventions: Nasal cannula in place.     Comments: On 2L O2  HENT:     Head: Normocephalic and atraumatic.     Nose: Nose normal.     Mouth/Throat:     Mouth: Mucous membranes are moist.  Cardiovascular:     Rate and Rhythm: Regular rhythm.     Heart sounds: Normal heart sounds. No murmur heard.    No gallop.  Pulmonary:     Effort: Pulmonary effort is normal. No respiratory distress.     Breath sounds: Normal breath sounds. No wheezing, rhonchi or rales.  Skin:    General: Skin is warm and dry.     Comments: Hyperpigmentation of dorsum of bilateral elbows.  Neurological:     Mental Status: He is alert and oriented to person, place, and time.     No results found for any visits on 12/07/23.    Assessment & Plan:   Centrilobular emphysema (HCC)  Chronic respiratory failure with hypoxia (HCC)  SOB (shortness of breath)  Dermatitis  Cigarette nicotine  dependence in remission  Pt scheduled for acute visit, however mentions chronic issues.  Increased SOB likely due to worsened COPD/emphysema.  Pt advised to wear O2 continuously.  Continue current meds. Stop smoking.  No s/s of COPD exacerbation.  Pt schedule app with Pulm and PCP.  OTC cortisone for elbows.  Return for f/u with PCP and Pulmonology.   Clotilda JONELLE Single, MD

## 2023-12-18 ENCOUNTER — Encounter: Payer: Self-pay | Admitting: Internal Medicine

## 2023-12-18 ENCOUNTER — Ambulatory Visit (INDEPENDENT_AMBULATORY_CARE_PROVIDER_SITE_OTHER): Admitting: Internal Medicine

## 2023-12-18 VITALS — BP 152/76 | HR 101 | Temp 98.2°F | Ht 73.0 in | Wt 168.4 lb

## 2023-12-18 DIAGNOSIS — Z0000 Encounter for general adult medical examination without abnormal findings: Secondary | ICD-10-CM | POA: Diagnosis not present

## 2023-12-18 DIAGNOSIS — E538 Deficiency of other specified B group vitamins: Secondary | ICD-10-CM

## 2023-12-18 DIAGNOSIS — F1020 Alcohol dependence, uncomplicated: Secondary | ICD-10-CM

## 2023-12-18 DIAGNOSIS — J449 Chronic obstructive pulmonary disease, unspecified: Secondary | ICD-10-CM

## 2023-12-18 DIAGNOSIS — E43 Unspecified severe protein-calorie malnutrition: Secondary | ICD-10-CM

## 2023-12-18 DIAGNOSIS — E559 Vitamin D deficiency, unspecified: Secondary | ICD-10-CM | POA: Diagnosis not present

## 2023-12-18 LAB — COMPREHENSIVE METABOLIC PANEL WITH GFR
ALT: 15 U/L (ref 0–53)
AST: 20 U/L (ref 0–37)
Albumin: 4.1 g/dL (ref 3.5–5.2)
Alkaline Phosphatase: 54 U/L (ref 39–117)
BUN: 9 mg/dL (ref 6–23)
CO2: 29 meq/L (ref 19–32)
Calcium: 9.4 mg/dL (ref 8.4–10.5)
Chloride: 102 meq/L (ref 96–112)
Creatinine, Ser: 0.75 mg/dL (ref 0.40–1.50)
GFR: 89.58 mL/min (ref >60.00)
Glucose, Bld: 108 mg/dL — ABNORMAL HIGH (ref 70–99)
Potassium: 3.8 meq/L (ref 3.5–5.1)
Sodium: 144 meq/L (ref 135–145)
Total Bilirubin: 1.2 mg/dL (ref 0.2–1.2)
Total Protein: 6.4 g/dL (ref 6.0–8.3)

## 2023-12-18 LAB — CBC WITH DIFFERENTIAL/PLATELET
Basophils Absolute: 0 K/uL (ref 0.0–0.1)
Basophils Relative: 0.5 % (ref 0.0–3.0)
Eosinophils Absolute: 0.1 K/uL (ref 0.0–0.7)
Eosinophils Relative: 1.8 % (ref 0.0–5.0)
HCT: 43.5 % (ref 39.0–52.0)
Hemoglobin: 14.5 g/dL (ref 13.0–17.0)
Lymphocytes Relative: 16.4 % (ref 12.0–46.0)
Lymphs Abs: 0.9 K/uL (ref 0.7–4.0)
MCHC: 33.3 g/dL (ref 30.0–36.0)
MCV: 95.1 fl (ref 78.0–100.0)
Monocytes Absolute: 0.6 K/uL (ref 0.1–1.0)
Monocytes Relative: 11.5 % (ref 3.0–12.0)
Neutro Abs: 3.7 K/uL (ref 1.4–7.7)
Neutrophils Relative %: 69.8 % (ref 43.0–77.0)
Platelets: 246 K/uL (ref 150.0–400.0)
RBC: 4.57 Mil/uL (ref 4.22–5.81)
RDW: 14.3 % (ref 11.5–15.5)
WBC: 5.3 K/uL (ref 4.0–10.5)

## 2023-12-18 LAB — VITAMIN D 25 HYDROXY (VIT D DEFICIENCY, FRACTURES): VITD: 27.42 ng/mL — ABNORMAL LOW (ref 30.00–100.00)

## 2023-12-18 LAB — LIPID PANEL
Cholesterol: 188 mg/dL (ref 0–200)
HDL: 97.3 mg/dL (ref >39.00)
LDL Cholesterol: 68 mg/dL (ref 0–99)
NonHDL: 91.03
Total CHOL/HDL Ratio: 2
Triglycerides: 116 mg/dL (ref 0.0–149.0)
VLDL: 23.2 mg/dL (ref 0.0–40.0)

## 2023-12-18 LAB — URINALYSIS, ROUTINE W REFLEX MICROSCOPIC
Bilirubin Urine: NEGATIVE
Ketones, ur: NEGATIVE
Leukocytes,Ua: NEGATIVE
Nitrite: NEGATIVE
Specific Gravity, Urine: 1.025 (ref 1.000–1.030)
Urine Glucose: NEGATIVE
Urobilinogen, UA: 1 (ref 0.0–1.0)
pH: 6 (ref 5.0–8.0)

## 2023-12-18 LAB — PSA: PSA: 0.57 ng/mL (ref 0.10–4.00)

## 2023-12-18 LAB — VITAMIN B12: Vitamin B-12: 230 pg/mL (ref 211–911)

## 2023-12-18 LAB — TSH: TSH: 1.37 u[IU]/mL (ref 0.35–5.50)

## 2023-12-18 NOTE — Patient Instructions (Signed)
 [employer] Travel and Household Use

## 2023-12-18 NOTE — Assessment & Plan Note (Signed)
 Better

## 2023-12-18 NOTE — Progress Notes (Signed)
 Subjective:  Patient ID: Travis Hicks, male    DOB: 14-Feb-1951  Age: 73 y.o. MRN: [id number]  CC: Annual Exam (Annual Exam)   HPI Travis Hicks presents for a well exam On O2 2 l/min 24/7 per Dr Kara for COPD  Outpatient Medications Prior to Visit  Medication Sig Dispense Refill   albuterol  (VENTOLIN  HFA) 108 (90 Base) MCG/ACT inhaler Inhale 2 puffs into the lungs every 6 (six) hours as needed for wheezing or shortness of breath. 8 g 11   b complex vitamins tablet Take 1 tablet by mouth daily. 100 tablet 3   Cholecalciferol (VITAMIN D3) 50 MCG (2000 UT) capsule Take 2 capsules (4,000 Units total) by mouth daily. 100 capsule 3   guaiFENesin  (MUCINEX ) 600 MG 12 hr tablet Take 1 tablet (600 mg total) by mouth 2 (two) times daily. 60 tablet 1   ipratropium-albuterol  (DUONEB) 0.5-2.5 (3) MG/3ML SOLN Take 3 mLs by nebulization every 4 (four) hours as needed. 360 mL 6   latanoprost  (XALATAN ) 0.005 % ophthalmic solution Place 1 drop into the left eye at bedtime.     Multiple Vitamin (MULTIVITAMIN WITH MINERALS) TABS tablet Take 1 tablet by mouth daily. 30 tablet 0   TRELEGY ELLIPTA  100-62.5-25 MCG/ACT AEPB TAKE 1 PUFF BY MOUTH EVERY DAY 60 each 5   No facility-administered medications prior to visit.    ROS: Review of Systems  Constitutional:  Negative for appetite change, fatigue and unexpected weight change.  HENT:  Negative for congestion, nosebleeds, sneezing, sore throat and trouble swallowing.   Eyes:  Negative for itching and visual disturbance.  Respiratory:  Positive for shortness of breath. Negative for cough.   Cardiovascular:  Negative for chest pain, palpitations and leg swelling.  Gastrointestinal:  Negative for abdominal distention, blood in stool, diarrhea and nausea.  Genitourinary:  Negative for frequency and hematuria.  Musculoskeletal:  Negative for back pain, gait problem, joint swelling and neck pain.  Skin:  Negative for rash.  Neurological:  Negative for  dizziness, tremors, speech difficulty and weakness.  Psychiatric/Behavioral:  Negative for agitation, dysphoric mood and sleep disturbance. The patient is not nervous/anxious.     Objective:  BP (!) 152/76   Pulse (!) 101   Temp 98.2 F (36.8 C)   Ht 6' 1 (1.854 m)   Wt 168 lb 6.4 oz (76.4 kg)   SpO2 94%   BMI 22.22 kg/m   BP Readings from Last 3 Encounters:  12/26/23 (!) 140/70  12/18/23 (!) 152/76  12/07/23 (!) 148/84    Wt Readings from Last 3 Encounters:  01/15/24 166 lb 3.6 oz (75.4 kg)  01/01/24 163 lb 9.3 oz (74.2 kg)  12/26/23 163 lb 5.8 oz (74.1 kg)    Physical Exam Constitutional:      General: He is not in acute distress.    Appearance: Normal appearance. He is well-developed.     Comments: NAD  Eyes:     Conjunctiva/sclera: Conjunctivae normal.     Pupils: Pupils are equal, round, and reactive to light.  Neck:     Thyroid : No thyromegaly.     Vascular: No JVD.  Cardiovascular:     Rate and Rhythm: Normal rate and regular rhythm.     Heart sounds: Normal heart sounds. No murmur heard.    No friction rub. No gallop.  Pulmonary:     Effort: Pulmonary effort is normal. No respiratory distress.     Breath sounds: Normal breath sounds. No wheezing or rales.  Chest:  Chest wall: No tenderness.  Abdominal:     General: Bowel sounds are normal. There is no distension.     Palpations: Abdomen is soft. There is no mass.     Tenderness: There is no abdominal tenderness. There is no guarding or rebound.  Musculoskeletal:        General: No tenderness. Normal range of motion.     Cervical back: Normal range of motion.  Lymphadenopathy:     Cervical: No cervical adenopathy.  Skin:    General: Skin is warm and dry.     Findings: No rash.  Neurological:     Mental Status: He is alert and oriented to person, place, and time.     Cranial Nerves: No cranial nerve deficit.     Motor: No abnormal muscle tone.     Coordination: Coordination normal.     Gait:  Gait normal.     Deep Tendon Reflexes: Reflexes are normal and symmetric.  Psychiatric:        Behavior: Behavior normal.        Thought Content: Thought content normal.        Judgment: Judgment normal.   On oxygen.  Decreased breath sounds at bases  Lab Results  Component Value Date   WBC 5.3 12/18/2023   HGB 14.5 12/18/2023   HCT 43.5 12/18/2023   PLT 246.0 12/18/2023   GLUCOSE 108 (H) 12/18/2023   CHOL 188 12/18/2023   TRIG 116.0 12/18/2023   HDL 97.30 12/18/2023   LDLCALC 68 12/18/2023   ALT 15 12/18/2023   AST 20 12/18/2023   NA 144 12/18/2023   K 3.8 12/18/2023   CL 102 12/18/2023   CREATININE 0.75 12/18/2023   BUN 9 12/18/2023   CO2 29 12/18/2023   TSH 1.37 12/18/2023   PSA 0.57 12/18/2023   INR 0.9 09/26/2021    CT Angio Chest PE W and/or Wo Contrast Result Date: 09/26/2021 CLINICAL DATA:  Pulmonary embolism (PE) suspected, high prob; Abdominal pain, acute, nonlocalized Abd pain Pt via POV sent from Dr office due to low O2 saturation in office at appointment today, being seen for COPD eval. Pt arrives visibly SOB with O2 sat 70% on room air, increased to 80% on 3L nasal cannula. EXAM: CT ANGIOGRAPHY CHEST CT ABDOMEN AND PELVIS WITH CONTRAST TECHNIQUE: Multidetector CT imaging of the chest was performed using the standard protocol during bolus administration of intravenous contrast. Multiplanar CT image reconstructions and MIPs were obtained to evaluate the vascular anatomy. Multidetector CT imaging of the abdomen and pelvis was performed using the standard protocol during bolus administration of intravenous contrast. RADIATION DOSE REDUCTION: This exam was performed according to the departmental dose-optimization program which includes automated exposure control, adjustment of the mA and/or kV according to patient size and/or use of iterative reconstruction technique. CONTRAST:  OMNIPAQUE  IOHEXOL  350 MG/ML SOLN COMPARISON:  None Available. FINDINGS: CTA CHEST FINDINGS  Cardiovascular: Satisfactory opacification of the pulmonary arteries to the segmental level. No central or segmental evidence of pulmonary embolism. Limited evaluation of the subsegmental level due to timing of contrast. The main pulmonary artery measures at the upper limits of normal. Normal heart size. No significant pericardial effusion. The thoracic aorta is normal in caliber. Mild atherosclerotic plaque of the thoracic aorta. No coronary artery calcifications. Mediastinum/Nodes: Enlarged right hilar lymph node measuring up to 1.7 cm. Prominent left hilar lymph nodes. Calcifications along the right left hilar lymph nodes may be sequelae of prior granulomatous disease. No enlarged mediastinal or  axillary lymph nodes. Thyroid  gland, trachea, and esophagus demonstrate no significant findings. Lungs/Pleura: Emphysematous changes. Diffuse bronchial wall thickening. Peribronchovascular tree-in-bud nodularity most prominent in the upper lung zones. Linear atelectasis versus scarring within the right middle lobe. No pulmonary nodule. No pulmonary mass. No pleural effusion. No pneumothorax. Musculoskeletal: No chest wall abnormality. No suspicious lytic or blastic osseous lesions. No acute displaced fracture. Multilevel degenerative changes of the spine. Review of the MIP images confirms the above findings. CT ABDOMEN and PELVIS FINDINGS Hepatobiliary: No focal liver abnormality. The gallbladder is contracted. No gallstones, gallbladder wall thickening, or pericholecystic fluid. No biliary dilatation. Pancreas: No focal lesion. Normal pancreatic contour. No surrounding inflammatory changes. No main pancreatic ductal dilatation. Spleen: Normal in size without focal abnormality. Adrenals/Urinary Tract: No adrenal nodule bilaterally. Bilateral kidneys enhance symmetrically. A left renal 1.4 cm fluid density lesion likely represents a simple renal cyst. Simple renal cysts, in the absence of clinically indicated  signs/symptoms, require no independent follow-up. No hydronephrosis. No hydroureter. The urinary bladder is unremarkable. Stomach/Bowel: Stomach is within normal limits. No evidence of bowel wall thickening or dilatation. Scattered colonic diverticulosis appendix appears normal. Vascular/Lymphatic: No abdominal aorta or iliac aneurysm. Severe atherosclerotic plaque of the aorta and its branches. No abdominal, pelvic, or inguinal lymphadenopathy. Reproductive: Prostate is unremarkable. Other: No intraperitoneal free fluid. No intraperitoneal free gas. No organized fluid collection. Musculoskeletal: No abdominal wall hernia or abnormality. 3.7 cm fat density lesion within the right gluteal musculature likely represents lipomatous lesion. No suspicious lytic or blastic osseous lesions. No acute displaced fracture. Grade 1 anterolisthesis of L4 on L5. Review of the MIP images confirms the above findings. IMPRESSION: 1. No central or segmental pulmonary embolus. Limited evaluation of the subsegmental level due to timing of contrast. 2. Pulmonary findings suggestive of an atypical infection. No follow-up needed if patient is low-risk (and has no known or suspected primary neoplasm). Non-contrast chest CT can be considered in 12 months if patient is high-risk. This recommendation follows the consensus statement: Guidelines for Management of Incidental Pulmonary Nodules Detected on CT Images: From the Fleischner Society 2017; Radiology 2017; 284:228-243. 3. Right middle lobe linear atelectasis versus scarring. Developing infection not excluded. 4. Right hilar lymphadenopathy may be reactive in etiology. Recommend attention on follow-up. 5. Colonic diverticulosis with no acute diverticulitis. 6.  Aortic Atherosclerosis (ICD10-I70.0). Electronically Signed   By: Morgane  Naveau M.D.   On: 09/26/2021 19:25   CT ABDOMEN PELVIS W CONTRAST Result Date: 09/26/2021 CLINICAL DATA:  Pulmonary embolism (PE) suspected, high prob;  Abdominal pain, acute, nonlocalized Abd pain Pt via POV sent from Dr office due to low O2 saturation in office at appointment today, being seen for COPD eval. Pt arrives visibly SOB with O2 sat 70% on room air, increased to 80% on 3L nasal cannula. EXAM: CT ANGIOGRAPHY CHEST CT ABDOMEN AND PELVIS WITH CONTRAST TECHNIQUE: Multidetector CT imaging of the chest was performed using the standard protocol during bolus administration of intravenous contrast. Multiplanar CT image reconstructions and MIPs were obtained to evaluate the vascular anatomy. Multidetector CT imaging of the abdomen and pelvis was performed using the standard protocol during bolus administration of intravenous contrast. RADIATION DOSE REDUCTION: This exam was performed according to the departmental dose-optimization program which includes automated exposure control, adjustment of the mA and/or kV according to patient size and/or use of iterative reconstruction technique. CONTRAST:  OMNIPAQUE  IOHEXOL  350 MG/ML SOLN COMPARISON:  None Available. FINDINGS: CTA CHEST FINDINGS Cardiovascular: Satisfactory opacification of the pulmonary arteries to  the segmental level. No central or segmental evidence of pulmonary embolism. Limited evaluation of the subsegmental level due to timing of contrast. The main pulmonary artery measures at the upper limits of normal. Normal heart size. No significant pericardial effusion. The thoracic aorta is normal in caliber. Mild atherosclerotic plaque of the thoracic aorta. No coronary artery calcifications. Mediastinum/Nodes: Enlarged right hilar lymph node measuring up to 1.7 cm. Prominent left hilar lymph nodes. Calcifications along the right left hilar lymph nodes may be sequelae of prior granulomatous disease. No enlarged mediastinal or axillary lymph nodes. Thyroid  gland, trachea, and esophagus demonstrate no significant findings. Lungs/Pleura: Emphysematous changes. Diffuse bronchial wall thickening.  Peribronchovascular tree-in-bud nodularity most prominent in the upper lung zones. Linear atelectasis versus scarring within the right middle lobe. No pulmonary nodule. No pulmonary mass. No pleural effusion. No pneumothorax. Musculoskeletal: No chest wall abnormality. No suspicious lytic or blastic osseous lesions. No acute displaced fracture. Multilevel degenerative changes of the spine. Review of the MIP images confirms the above findings. CT ABDOMEN and PELVIS FINDINGS Hepatobiliary: No focal liver abnormality. The gallbladder is contracted. No gallstones, gallbladder wall thickening, or pericholecystic fluid. No biliary dilatation. Pancreas: No focal lesion. Normal pancreatic contour. No surrounding inflammatory changes. No main pancreatic ductal dilatation. Spleen: Normal in size without focal abnormality. Adrenals/Urinary Tract: No adrenal nodule bilaterally. Bilateral kidneys enhance symmetrically. A left renal 1.4 cm fluid density lesion likely represents a simple renal cyst. Simple renal cysts, in the absence of clinically indicated signs/symptoms, require no independent follow-up. No hydronephrosis. No hydroureter. The urinary bladder is unremarkable. Stomach/Bowel: Stomach is within normal limits. No evidence of bowel wall thickening or dilatation. Scattered colonic diverticulosis appendix appears normal. Vascular/Lymphatic: No abdominal aorta or iliac aneurysm. Severe atherosclerotic plaque of the aorta and its branches. No abdominal, pelvic, or inguinal lymphadenopathy. Reproductive: Prostate is unremarkable. Other: No intraperitoneal free fluid. No intraperitoneal free gas. No organized fluid collection. Musculoskeletal: No abdominal wall hernia or abnormality. 3.7 cm fat density lesion within the right gluteal musculature likely represents lipomatous lesion. No suspicious lytic or blastic osseous lesions. No acute displaced fracture. Grade 1 anterolisthesis of L4 on L5. Review of the MIP images  confirms the above findings. IMPRESSION: 1. No central or segmental pulmonary embolus. Limited evaluation of the subsegmental level due to timing of contrast. 2. Pulmonary findings suggestive of an atypical infection. No follow-up needed if patient is low-risk (and has no known or suspected primary neoplasm). Non-contrast chest CT can be considered in 12 months if patient is high-risk. This recommendation follows the consensus statement: Guidelines for Management of Incidental Pulmonary Nodules Detected on CT Images: From the Fleischner Society 2017; Radiology 2017; 284:228-243. 3. Right middle lobe linear atelectasis versus scarring. Developing infection not excluded. 4. Right hilar lymphadenopathy may be reactive in etiology. Recommend attention on follow-up. 5. Colonic diverticulosis with no acute diverticulitis. 6.  Aortic Atherosclerosis (ICD10-I70.0). Electronically Signed   By: Morgane  Naveau M.D.   On: 09/26/2021 19:25   DG Chest Portable 1 View Result Date: 09/26/2021 CLINICAL DATA:  Shortness of breath. EXAM: PORTABLE CHEST 1 VIEW COMPARISON:  Chest x-ray dated January 28, 2021. FINDINGS: The heart size and mediastinal contours are within normal limits. Normal pulmonary vascularity. Hazy airspace density in the right upper lobe. No pleural effusion or pneumothorax. No acute osseous abnormality. IMPRESSION: 1. Right upper lobe pneumonia. Electronically Signed   By: Elsie ONEIDA Shoulder M.D.   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 Labs were ordered to be later reviewed . All questions were answered. We discussed one or more of the following - seat belt use, use of sunscreen/sun exposure exercise, safe sex, fall risk reduction, second hand smoke exposure, firearm use and storage, seat belt use, a need for adhering to healthy diet and exercise. Labs were reviewed and new ordered.  All questions were answered. Last COLON 1/21 Dr Aneita Coronary CT calcium info given 2019. 2020:  coronary calcium CT score is 0.  Declined all shots      Relevant Orders   TSH (Completed)   Urinalysis   CBC with Differential/Platelet (Completed)   Lipid panel (Completed)   PSA (Completed)   Comprehensive metabolic panel with GFR (Completed)   Vitamin B12 (Completed)   VITAMIN D  25 Hydroxy (Vit-D Deficiency, Fractures) (Completed)   Other Visit Diagnoses       Low serum vitamin B12       Relevant Orders   Vitamin B12 (Completed)         No orders of the defined types were placed in this encounter.     Follow-up: Return in about 4 months (around 04/19/2024) for a follow-up visit.  Marolyn Noel, MD

## 2023-12-18 NOTE — Assessment & Plan Note (Signed)
  We discussed age appropriate health related issues, including available/recomended screening tests and vaccinations. Labs were ordered to be later reviewed . All questions were answered. We discussed one or more of the following - seat belt use, use of sunscreen/sun exposure exercise, safe sex, fall risk reduction, second hand smoke exposure, firearm use and storage, seat belt use, a need for adhering to healthy diet and exercise. Labs were reviewed and new ordered.  All questions were answered. Last COLON 1/21 Dr Russella Dar Coronary CT calcium info given 2019. 2020:  coronary calcium CT score is 0.  Declined all shots

## 2023-12-18 NOTE — Assessment & Plan Note (Signed)
 On O2 2 l/min 24/7 per Dr Kara for COPD

## 2023-12-19 ENCOUNTER — Ambulatory Visit: Payer: Self-pay | Admitting: Internal Medicine

## 2023-12-21 ENCOUNTER — Telehealth (HOSPITAL_COMMUNITY): Payer: Self-pay

## 2023-12-21 ENCOUNTER — Encounter (HOSPITAL_COMMUNITY): Payer: Self-pay

## 2023-12-21 NOTE — Telephone Encounter (Signed)
 Received referral from Dr Garald for this pt to participate in Pulmonary Rehab with the diagnosis of COPD. Clinical review of pt follow up appt on 10/30/23 Pulmonary office note. Pt appropriate for scheduling for Pulmonary rehab. Will forward to support staff for scheduling and verification of insurance eligibility/benefits with pt consent.   LVM for pt to schedule an appointment. MyChart message sent  [facility]

## 2023-12-21 NOTE — Telephone Encounter (Signed)
 Pt returned phone call and is interested in the pulmonary rehab program. Pt will come in for orientation on 11/5@1  and will attend the 1:15 exercise class time.   Sent packet

## 2023-12-24 NOTE — Telephone Encounter (Signed)
 Travis Hicks

## 2023-12-25 ENCOUNTER — Telehealth (HOSPITAL_COMMUNITY): Payer: Self-pay

## 2023-12-25 NOTE — Telephone Encounter (Signed)
 Called to confirm appt. Pt confirmed appt. Instructed pt on proper footwear. Gave directions along with department number.

## 2023-12-26 ENCOUNTER — Encounter (HOSPITAL_COMMUNITY): Payer: Self-pay

## 2023-12-26 ENCOUNTER — Encounter (HOSPITAL_COMMUNITY)
Admission: RE | Admit: 2023-12-26 | Discharge: 2023-12-26 | Disposition: A | Discharge status: Home / Self Care | Source: Ambulatory Visit | Attending: Pulmonary Disease | Admitting: Pulmonary Disease

## 2023-12-26 VITALS — BP 140/70 | HR 94 | Wt 163.4 lb

## 2023-12-26 DIAGNOSIS — J449 Chronic obstructive pulmonary disease, unspecified: Secondary | ICD-10-CM | POA: Insufficient documentation

## 2023-12-26 NOTE — Progress Notes (Signed)
 Travis Hicks 73 y.o. male Pulmonary Rehab Orientation Note This patient who was referred to Pulmonary Rehab by Dr. Garald with the diagnosis of COPD 3 arrived today in Cardiac and Pulmonary Rehab. He arrived ambulatory with normal gait. He does carry portable oxygen. [employer] is the provider for their DME. Per patient, Travis Hicks uses oxygen continuously. Color good, skin warm and dry. Patient is oriented to time and place. Patient's medical history, psychosocial health, and medications reviewed. Psychosocial assessment reveals patient lives with spouse. Travis Hicks is currently retired. Patient hobbies include fishing. Patient reports his stress level is low. Patient does not exhibit signs of depression. PHQ2/9 score 2/3. Travis Hicks shows good  coping skills with positive outlook on life. Offered emotional support and reassurance. Will continue to monitor. Physical assessment performed by Nurse pick: Travis Levin RN. Please see their orientation physical assessment note. Travis Hicks reports he  does take medications as prescribed. Patient states he  follows a regular  diet. The patient reports no specific efforts to gain or lose weight.. Patient's weight will be monitored closely. Demonstration and practice of PLB using pulse oximeter. Travis Hicks able to return demonstration satisfactorily. Safety and hand hygiene in the exercise area reviewed with patient. Travis Hicks voices understanding of the information reviewed. Department expectations discussed with patient and achievable goals were set. The patient shows enthusiasm about attending the program and we look forward to working with Travis. Travis Hicks completed a 6 min walk test today and is scheduled to begin exercise on 01/01/24.   [id number] Travis Hicks, BSRT

## 2023-12-26 NOTE — Progress Notes (Signed)
 Gretta Side 73 y.o. male  Initial Psychosocial Assessment  Pt psychosocial assessment reveals pt lives with their spouse. Pt is currently retired. Pt hobbies include fishing. Pt reports his  stress level is low.  Pt does not exhibit signs of depression.  Pt shows good  coping skills with positive outlook . Offered emotional support and reassurance. Monitor and evaluate progress toward psychosocial goal(s).  Goal(s): Help patient work toward returning to meaningful activities that improve patient's QOL and are attainable with patient's lung disease   12/26/2023 2:42 PM

## 2023-12-26 NOTE — Progress Notes (Signed)
 Pulmonary Individual Treatment Plan  Patient Details  Name: Travis Hicks MRN: [id number] Date of Birth: 07/25/1950 Referring Provider:   Conrad Ports Pulmonary Rehab Walk Test from 12/26/2023 in [facility] for Heart, Vascular, & Lung Health  Referring Provider Plotnikov    Initial Encounter Date:  Flowsheet Row Pulmonary Rehab Walk Test from 12/26/2023 in [facility] for Heart, Vascular, & Lung Health  Date 12/26/23    Visit Diagnosis: Stage 3 severe COPD by GOLD classification (HCC)  Patient's Home Medications on Admission:   Current Outpatient Medications:    albuterol  (VENTOLIN  HFA) 108 (90 Base) MCG/ACT inhaler, Inhale 2 puffs into the lungs every 6 (six) hours as needed for wheezing or shortness of breath., Disp: 8 g, Rfl: 11   b complex vitamins tablet, Take 1 tablet by mouth daily., Disp: 100 tablet, Rfl: 3   Cholecalciferol (VITAMIN D3) 50 MCG (2000 UT) capsule, Take 2 capsules (4,000 Units total) by mouth daily., Disp: 100 capsule, Rfl: 3   guaiFENesin  (MUCINEX ) 600 MG 12 hr tablet, Take 1 tablet (600 mg total) by mouth 2 (two) times daily., Disp: 60 tablet, Rfl: 1   ipratropium-albuterol  (DUONEB) 0.5-2.5 (3) MG/3ML SOLN, Take 3 mLs by nebulization every 4 (four) hours as needed., Disp: 360 mL, Rfl: 6   latanoprost  (XALATAN ) 0.005 % ophthalmic solution, Place 1 drop into the left eye at bedtime., Disp: , Rfl:    Multiple Vitamin (MULTIVITAMIN WITH MINERALS) TABS tablet, Take 1 tablet by mouth daily., Disp: 30 tablet, Rfl: 0   TRELEGY ELLIPTA  100-62.5-25 MCG/ACT AEPB, TAKE 1 PUFF BY MOUTH EVERY DAY, Disp: 60 each, Rfl: 5  Past Medical History: Past Medical History:  Diagnosis Date   Cataract    veru small per pt .   Emphysema of lung (HCC)    Glaucoma 2018   Overactive bladder     Tobacco Use: Social History   Tobacco Use  Smoking Status Former   Current packs/day: 0.00   Types: Cigarettes   Quit date: 08/21/2023    Years since quitting: 0.3  Smokeless Tobacco Never  Tobacco Comments   Quit smoking July 2025    Labs: Review Flowsheet  More data exists      Latest Ref Rng & Units 01/14/2019 03/05/2020 09/26/2021 12/14/2022 12/18/2023  Labs for ITP Cardiac and Pulmonary Rehab  Cholestrol 0 - 200 mg/dL 803  859  - 796  811   LDL (calc) 0 - 99 mg/dL 60  43  - 897  68   HDL-C >39.00 mg/dL 878.29  73  - 23.89  02.69   Trlycerides 0.0 - 149.0 mg/dL 28.9  833  - 875.9  883.9   Bicarbonate 20.0 - 28.0 mmol/L - - 32.6  - -  O2 Saturation % - - 86.6  - -    Capillary Blood Glucose: No results found for: GLUCAP   Pulmonary Assessment Scores:  Pulmonary Assessment Scores     Row Name 12/26/23 1432         ADL UCSD   ADL Phase Entry     SOB Score total 91       CAT Score   CAT Score 22       mMRC Score   mMRC Score 4       UCSD: Self-administered rating of dyspnea associated with activities of daily living (ADLs) 6-point scale (0 = not at all to 5 = maximal or unable to do because of breathlessness)  Scoring Scores  range from 0 to 120.  Minimally important difference is 5 units  CAT: CAT can identify the health impairment of COPD patients and is better correlated with disease progression.  CAT has a scoring range of zero to 40. The CAT score is classified into four groups of low (less than 10), medium (10 - 20), high (21-30) and very high (31-40) based on the impact level of disease on health status. A CAT score over 10 suggests significant symptoms.  A worsening CAT score could be explained by an exacerbation, poor medication adherence, poor inhaler technique, or progression of COPD or comorbid conditions.  CAT MCID is 2 points  mMRC: mMRC (Modified Medical Research Council) Dyspnea Scale is used to assess the degree of baseline functional disability in patients of respiratory disease due to dyspnea. No minimal important difference is established. A decrease in score of 1 point or  greater is considered a positive change.   Pulmonary Function Assessment:  Pulmonary Function Assessment - 12/26/23 1341       Breath   Bilateral Breath Sounds Clear;Decreased    Shortness of Breath Yes;Limiting activity;Fear of Shortness of Breath;Panic with Shortness of Breath          Exercise Target Goals: Exercise Program Goal: Individual exercise prescription set using results from initial 6 min walk test and THRR while considering  patient's activity barriers and safety.   Exercise Prescription Goal: Initial exercise prescription builds to 30-45 minutes a day of aerobic activity, 2-3 days per week.  Home exercise guidelines will be given to patient during program as part of exercise prescription that the participant will acknowledge.  Activity Barriers & Risk Stratification:  Activity Barriers & Cardiac Risk Stratification - 12/26/23 1339       Activity Barriers & Cardiac Risk Stratification   Activity Barriers Deconditioning;Muscular Weakness;Shortness of Breath    Cardiac Risk Stratification Moderate          6 Minute Walk:  6 Minute Walk     Row Name 12/26/23 1427         6 Minute Walk   Phase Initial     Distance 630 feet     Walk Time 6 minutes     # of Rest Breaks 4  1:49-2:12; 2:46-3:10; 4:11-4:25; 5:12-5:44     MPH 1.19     METS 2.59     RPE 13     Perceived Dyspnea  4     VO2 Peak 9.06     Resting HR 90 bpm     Resting BP 140/70     Resting Oxygen Saturation  95 %     Exercise Oxygen Saturation  during 6 min walk 93 %     Max Ex. HR 132 bpm     Max Ex. BP 152/80     2 Minute Post BP 150/80       Interval HR   1 Minute HR 132     2 Minute HR 109     3 Minute HR 132     4 Minute HR 132     5 Minute HR 131     6 Minute HR 118     2 Minute Post HR 110     Interval Heart Rate? Yes       Interval Oxygen   Interval Oxygen? Yes     Baseline Oxygen Saturation % 95 %     1 Minute Oxygen Saturation % 96 %     1 Minute Liters  of Oxygen 2 L      2 Minute Oxygen Saturation % 94 %     2 Minute Liters of Oxygen 2 L     3 Minute Oxygen Saturation % 94 %     3 Minute Liters of Oxygen 2 L     4 Minute Oxygen Saturation % 94 %     4 Minute Liters of Oxygen 2 L     5 Minute Oxygen Saturation % 93 %     5 Minute Liters of Oxygen 2 L     6 Minute Oxygen Saturation % 93 %     6 Minute Liters of Oxygen 2 L     2 Minute Post Oxygen Saturation % 95 %     2 Minute Post Liters of Oxygen 2 L        Oxygen Initial Assessment:  Oxygen Initial Assessment - 12/26/23 1339       Home Oxygen   Home Oxygen Device Home Concentrator;Portable Concentrator    Sleep Oxygen Prescription Continuous    Liters per minute 2    Home Exercise Oxygen Prescription Continuous    Liters per minute 2    Home Resting Oxygen Prescription Continuous    Liters per minute 2    Compliance with Home Oxygen Use Yes      Initial 6 min Walk   Oxygen Used Continuous    Liters per minute 2      Program Oxygen Prescription   Program Oxygen Prescription Continuous    Liters per minute 2      Intervention   Short Term Goals To learn and exhibit compliance with exercise, home and travel O2 prescription;To learn and understand importance of maintaining oxygen saturations>88%;To learn and demonstrate proper use of respiratory medications;To learn and understand importance of monitoring SPO2 with pulse oximeter and demonstrate accurate use of the pulse oximeter.;To learn and demonstrate proper pursed lip breathing techniques or other breathing techniques.     Long  Term Goals Exhibits compliance with exercise, home  and travel O2 prescription;Maintenance of O2 saturations>88%;Compliance with respiratory medication;Verbalizes importance of monitoring SPO2 with pulse oximeter and return demonstration;Exhibits proper breathing techniques, such as pursed lip breathing or other method taught during program session;Demonstrates proper use of MDI's          Oxygen  Re-Evaluation:   Oxygen Discharge (Final Oxygen Re-Evaluation):   Initial Exercise Prescription:  Initial Exercise Prescription - 12/26/23 1400       Date of Initial Exercise RX and Referring Provider   Date 12/26/23    Referring Provider Plotnikov    Expected Discharge Date 04/01/24      Oxygen   Oxygen Continuous    Liters 2    Maintain Oxygen Saturation 88% or higher      Recumbant Bike   Level 1    RPM 56    Minutes 15    METs 1.8      NuStep   Level 1    SPM 67    Minutes 15    METs 1.9      Prescription Details   Frequency (times per week) 2    Duration Progress to 30 minutes of continuous aerobic without signs/symptoms of physical distress      Intensity   THRR 40-80% of Max Heartrate 59-118    Ratings of Perceived Exertion 11-13    Perceived Dyspnea 0-4      Progression   Progression Continue to progress workloads to maintain intensity  without signs/symptoms of physical distress.      Resistance Training   Training Prescription No          Perform Capillary Blood Glucose checks as needed.  Exercise Prescription Changes:   Exercise Comments:   Exercise Goals and Review:   Exercise Goals     Row Name 12/26/23 1316             Exercise Goals   Increase Physical Activity Yes       Intervention Provide advice, education, support and counseling about physical activity/exercise needs.;Develop an individualized exercise prescription for aerobic and resistive training based on initial evaluation findings, risk stratification, comorbidities and participant's personal goals.       Expected Outcomes Short Term: Attend rehab on a regular basis to increase amount of physical activity.;Long Term: Exercising regularly at least 3-5 days a week.;Long Term: Add in home exercise to make exercise part of routine and to increase amount of physical activity.       Increase Strength and Stamina Yes       Intervention Provide advice, education, support and  counseling about physical activity/exercise needs.;Develop an individualized exercise prescription for aerobic and resistive training based on initial evaluation findings, risk stratification, comorbidities and participant's personal goals.       Expected Outcomes Short Term: Perform resistance training exercises routinely during rehab and add in resistance training at home;Long Term: Improve cardiorespiratory fitness, muscular endurance and strength as measured by increased METs and functional capacity ( );Short Term: Increase workloads from initial exercise prescription for resistance, speed, and METs.       Able to understand and use rate of perceived exertion (RPE) scale Yes       Intervention Provide education and explanation on how to use RPE scale       Expected Outcomes Short Term: Able to use RPE daily in rehab to express subjective intensity level;Long Term:  Able to use RPE to guide intensity level when exercising independently       Able to understand and use Dyspnea scale Yes       Intervention Provide education and explanation on how to use Dyspnea scale       Expected Outcomes Short Term: Able to use Dyspnea scale daily in rehab to express subjective sense of shortness of breath during exertion;Long Term: Able to use Dyspnea scale to guide intensity level when exercising independently       Knowledge and understanding of Target Heart Rate Range (THRR) Yes       Intervention Provide education and explanation of THRR including how the numbers were predicted and where they are located for reference       Expected Outcomes Short Term: Able to state/look up THRR;Long Term: Able to use THRR to govern intensity when exercising independently;Short Term: Able to use daily as guideline for intensity in rehab       Understanding of Exercise Prescription Yes       Intervention Provide education, explanation, and written materials on patient's individual exercise prescription       Expected  Outcomes Short Term: Able to explain program exercise prescription;Long Term: Able to explain home exercise prescription to exercise independently          Exercise Goals Re-Evaluation :   Discharge Exercise Prescription (Final Exercise Prescription Changes):   Nutrition:  Target Goals: Understanding of nutrition guidelines, daily intake of sodium 1500mg , cholesterol 200mg , calories 30% from fat and 7% or less from saturated fats, daily to have 5  or more servings of fruits and vegetables.  Biometrics:  Pre Biometrics - 12/26/23 1438       Pre Biometrics   Grip Strength 34 kg           Nutrition Therapy Plan and Nutrition Goals:   Nutrition Assessments:  MEDIFICTS Score Key: >=70 Need to make dietary changes  40-70 Heart Healthy Diet <= 40 Therapeutic Level Cholesterol Diet   Picture Your Plate Scores: <59 Unhealthy dietary pattern with much room for improvement. 41-50 Dietary pattern unlikely to meet recommendations for good health and room for improvement. 51-60 More healthful dietary pattern, with some room for improvement.  >60 Healthy dietary pattern, although there may be some specific behaviors that could be improved.    Nutrition Goals Re-Evaluation:   Nutrition Goals Discharge (Final Nutrition Goals Re-Evaluation):   Psychosocial: Target Goals: Acknowledge presence or absence of significant depression and/or stress, maximize coping skills, provide positive support system. Participant is able to verbalize types and ability to use techniques and skills needed for reducing stress and depression.  Initial Review & Psychosocial Screening:  Initial Psych Review & Screening - 12/26/23 1337       Initial Review   Current issues with None Identified      Family Dynamics   Good Support System? Yes    Comments Support from his family      Barriers   Psychosocial barriers to participate in program There are no identifiable barriers or psychosocial needs.       Screening Interventions   Interventions Encouraged to exercise    Expected Outcomes Short Term goal: Identification and review with participant of any Quality of Life or Depression concerns found by scoring the questionnaire.;Long Term goal: The participant improves quality of Life and PHQ9 Scores as seen by post scores and/or verbalization of changes          Quality of Life Scores:  Scores of 19 and below usually indicate a poorer quality of life in these areas.  A difference of  2-3 points is a clinically meaningful difference.  A difference of 2-3 points in the total score of the Quality of Life Index has been associated with significant improvement in overall quality of life, self-image, physical symptoms, and general health in studies assessing change in quality of life.  PHQ-9: Review Flowsheet  More data exists      12/26/2023 12/14/2022 11/30/2022 01/09/2022 07/08/2021  Depression screen PHQ 2/9  Decreased Interest 2 2 3  0 0 3  Down, Depressed, Hopeless 0 0 0 0 0 0  PHQ - 2 Score 2 2 3  0 0 3  Altered sleeping 1 1 0 2 1  Tired, decreased energy 0 3 2 0 2  Change in appetite 0 1 3 2 3   Feeling bad or failure about yourself  0 0 0 0 0  Trouble concentrating 0 3 2 0 0  Moving slowly or fidgety/restless 0 0 0 0 0  Suicidal thoughts 0 0 0 0 0  PHQ-9 Score 3 10 10 4 9   Difficult doing work/chores Not difficult at all Somewhat difficult - Not difficult at all Somewhat difficult    Details       Multiple values from one day are sorted in reverse-chronological order        Interpretation of Total Score  Total Score Depression Severity:  1-4 = Minimal depression, 5-9 = Mild depression, 10-14 = Moderate depression, 15-19 = Moderately severe depression, 20-27 = Severe depression   Psychosocial Evaluation  and Intervention:  Psychosocial Evaluation - 12/26/23 1437       Psychosocial Evaluation & Interventions   Interventions Encouraged to exercise with the program and  follow exercise prescription    Comments Paulette denies any psychosocial barriers or concerns at this time. He has good support from his wife and family. No needs at this time.    Expected Outcomes For Ewan to participate in PR free of any psychosocial barriers or concerns    Continue Psychosocial Services  No Follow up required          Psychosocial Re-Evaluation:   Psychosocial Discharge (Final Psychosocial Re-Evaluation):   Education: Education Goals: Education classes will be provided on a weekly basis, covering required topics. Participant will state understanding/return demonstration of topics presented.  Learning Barriers/Preferences:  Learning Barriers/Preferences - 12/26/23 1338       Learning Barriers/Preferences   Learning Barriers Sight    Learning Preferences None          Education Topics: Know Your Numbers Group instruction that is supported by a PowerPoint presentation. Instructor discusses importance of knowing and understanding resting, exercise, and post-exercise oxygen saturation, heart rate, and blood pressure. Oxygen saturation, heart rate, blood pressure, rating of perceived exertion, and dyspnea are reviewed along with a normal range for these values.    Exercise for the Pulmonary Patient Group instruction that is supported by a PowerPoint presentation. Instructor discusses benefits of exercise, core components of exercise, frequency, duration, and intensity of an exercise routine, importance of utilizing pulse oximetry during exercise, safety while exercising, and options of places to exercise outside of rehab.    MET Level  Group instruction provided by PowerPoint, verbal discussion, and written material to support subject matter. Instructor reviews what METs are and how to increase METs.    Pulmonary Medications Verbally interactive group education provided by instructor with focus on inhaled medications and proper administration.   Anatomy and  Physiology of the Respiratory System Group instruction provided by PowerPoint, verbal discussion, and written material to support subject matter. Instructor reviews respiratory cycle and anatomical components of the respiratory system and their functions. Instructor also reviews differences in obstructive and restrictive respiratory diseases with examples of each.    Oxygen Safety Group instruction provided by PowerPoint, verbal discussion, and written material to support subject matter. There is an overview of "What is Oxygen" and "Why do we need it".  Instructor also reviews how to create a safe environment for oxygen use, the importance of using oxygen as prescribed, and the risks of noncompliance. There is a brief discussion on traveling with oxygen and resources the patient may utilize.   Oxygen Use Group instruction provided by PowerPoint, verbal discussion, and written material to discuss how supplemental oxygen is prescribed and different types of oxygen supply systems. Resources for more information are provided.    Breathing Techniques Group instruction that is supported by demonstration and informational handouts. Instructor discusses the benefits of pursed lip and diaphragmatic breathing and detailed demonstration on how to perform both.     Risk Factor Reduction Group instruction that is supported by a PowerPoint presentation. Instructor discusses the definition of a risk factor, different risk factors for pulmonary disease, and how the heart and lungs work together.   Pulmonary Diseases Group instruction provided by PowerPoint, verbal discussion, and written material to support subject matter. Instructor gives an overview of the different type of pulmonary diseases. There is also a discussion on risk factors and symptoms as well as ways to  manage the diseases.   Stress and Energy Conservation Group instruction provided by PowerPoint, verbal discussion, and written material to  support subject matter. Instructor gives an overview of stress and the impact it can have on the body. Instructor also reviews ways to reduce stress. There is also a discussion on energy conservation and ways to conserve energy throughout the day.   Warning Signs and Symptoms Group instruction provided by PowerPoint, verbal discussion, and written material to support subject matter. Instructor reviews warning signs and symptoms of stroke, heart attack, cold and flu. Instructor also reviews ways to prevent the spread of infection.   Other Education Group or individual verbal, written, or video instructions that support the educational goals of the pulmonary rehab program.    Knowledge Questionnaire Score:  Knowledge Questionnaire Score - 12/26/23 1433       Knowledge Questionnaire Score   Pre Score 15/18          Core Components/Risk Factors/Patient Goals at Admission:  Personal Goals and Risk Factors at Admission - 12/26/23 1317       Core Components/Risk Factors/Patient Goals on Admission   Improve shortness of breath with ADL's Yes    Intervention Provide education, individualized exercise plan and daily activity instruction to help decrease symptoms of SOB with activities of daily living.    Expected Outcomes Short Term: Improve cardiorespiratory fitness to achieve a reduction of symptoms when performing ADLs;Long Term: Be able to perform more ADLs without symptoms or delay the onset of symptoms          Core Components/Risk Factors/Patient Goals Review:    Core Components/Risk Factors/Patient Goals at Discharge (Final Review):    ITP Comments:   Comments: Dr. Slater Staff is Medical Director for Pulmonary Rehab at [facility].

## 2024-01-01 ENCOUNTER — Encounter (HOSPITAL_COMMUNITY)
Admission: RE | Admit: 2024-01-01 | Discharge: 2024-01-01 | Disposition: A | Discharge status: Home / Self Care | Source: Ambulatory Visit | Attending: Internal Medicine | Admitting: Internal Medicine

## 2024-01-01 VITALS — Wt 163.6 lb

## 2024-01-01 DIAGNOSIS — J449 Chronic obstructive pulmonary disease, unspecified: Secondary | ICD-10-CM

## 2024-01-01 NOTE — Progress Notes (Signed)
 Daily Session Note  Patient Details  Name: Travis Hicks MRN: [id number] Date of Birth: 08-07-1950 Referring Provider:   Conrad Ports Pulmonary Rehab Walk Test from 12/26/2023 in [facility] for Heart, Vascular, & Lung Health  Referring Provider Plotnikov    Encounter Date: 01/01/2024  Check In:  Session Check In - 01/01/24 1411       Check-In   Supervising physician immediately available to respond to emergencies [facility] MD immediately available    Physician(s) Barnie Hila, NP    Location [facility]    Staff Present [name], RN, BSN;Casey Claudene, RT;Tatyanna Cronk [facility] BS, ACSM-CEP, Exercise Physiologist;Annedrea Violetta, RN, Jerri Moats, MS, ACSM-CEP, Exercise Physiologist    Virtual Visit No    Medication changes reported     No    Fall or balance concerns reported    No    Tobacco Cessation No Change    Warm-up and Cool-down Performed as group-led instruction    Resistance Training Performed Yes    VAD Patient? No    PAD/SET Patient? No      Pain Assessment   Currently in Pain? No/denies    Multiple Pain Sites No          Capillary Blood Glucose: No results found for this or any previous visit (from the past 24 hours).   Exercise Prescription Changes - 01/01/24 1500       Response to Exercise   Blood Pressure (Admit) 130/80    Blood Pressure (Exercise) 140/80    Blood Pressure (Exit) 120/70    Heart Rate (Admit) 81 bpm    Heart Rate (Exercise) 98 bpm    Heart Rate (Exit) 87 bpm    Oxygen Saturation (Admit) 93 %    Oxygen Saturation (Exercise) 96 %    Oxygen Saturation (Exit) 96 %    Rating of Perceived Exertion (Exercise) 10    Perceived Dyspnea (Exercise) 3    Duration Continue with 30 min of aerobic exercise without signs/symptoms of physical distress.    Intensity THRR unchanged      Progression   Progression Continue to progress workloads to maintain intensity without signs/symptoms of physical  distress.      Oxygen   Oxygen Continuous    Liters 2      NuStep   Level 1    SPM 66    Minutes 15    METs 1.8      Oxygen   Maintain Oxygen Saturation 88% or higher          Social History   Tobacco Use  Smoking Status Former   Current packs/day: 0.00   Types: Cigarettes   Quit date: 08/21/2023   Years since quitting: 0.3  Smokeless Tobacco Never  Tobacco Comments   Quit smoking July 2025    Goals Met:  Exercise tolerated well No report of concerns or symptoms today Strength training completed today  Goals Unmet:  Not Applicable  Comments: Service time is from 1307 to 1445.    Dr. Slater Staff is Medical Director for Pulmonary Rehab at [facility].

## 2024-01-03 ENCOUNTER — Encounter (HOSPITAL_COMMUNITY)
Admission: RE | Admit: 2024-01-03 | Discharge: 2024-01-03 | Disposition: A | Discharge status: Home / Self Care | Source: Ambulatory Visit | Attending: Internal Medicine | Admitting: Internal Medicine

## 2024-01-03 DIAGNOSIS — J449 Chronic obstructive pulmonary disease, unspecified: Secondary | ICD-10-CM | POA: Diagnosis not present

## 2024-01-03 NOTE — Progress Notes (Signed)
 Daily Session Note  Patient Details  Name: Travis Hicks MRN: [id number] Date of Birth: [birth year] Referring Provider:   Conrad Ports Pulmonary Rehab Walk Test from 12/26/2023 in [facility]  Referring Provider Plotnikov    Encounter Date: 01/03/2024  Check In:  Session Check In - 01/03/24 1334       Check-In   Supervising physician immediately available to respond to emergencies [facility] MD immediately available    Physician(s) Josefa Beauvais, NP    Location [facility]    Staff Present [name], RN, BSN;Casey Claudene, RT;Randi [facility] BS, ACSM-CEP, Exercise Physiologist;Kaylee Nicholaus, MS, ACSM-CEP, Exercise Physiologist    Virtual Visit No    Medication changes reported     No    Fall or balance concerns reported    No    Tobacco Cessation No Change    Warm-up and Cool-down Performed as group-led instruction    Resistance Training Performed Yes    VAD Patient? No    PAD/SET Patient? No      Pain Assessment   Currently in Pain? No/denies          Capillary Blood Glucose: No results found for this or any previous visit (from the past 24 hours).    Social History   Tobacco Use  Smoking Status Former   Current packs/day: 0.00   Types: Cigarettes   Quit date: 08/21/2023   Years since quitting: 0.3  Smokeless Tobacco Never  Tobacco Comments   Quit smoking July 2025    Goals Met:  Exercise tolerated well Queuing for purse lip breathing No report of concerns or symptoms today Strength training completed today  Goals Unmet:  Not Applicable  Comments: Service time is from 1311 to 1452    Dr. Slater Staff is Medical Director for Pulmonary Rehab at [facility].

## 2024-01-08 ENCOUNTER — Encounter (HOSPITAL_COMMUNITY)
Admission: RE | Admit: 2024-01-08 | Discharge: 2024-01-08 | Disposition: A | Discharge status: Home / Self Care | Source: Ambulatory Visit | Attending: Internal Medicine | Admitting: Internal Medicine

## 2024-01-08 DIAGNOSIS — J449 Chronic obstructive pulmonary disease, unspecified: Secondary | ICD-10-CM

## 2024-01-08 NOTE — Progress Notes (Signed)
 Daily Session Note  Patient Details  Name: Travis Hicks MRN: [id number] Date of Birth: 12/16/1950 Referring Provider:   Conrad Ports Pulmonary Rehab Walk Test from 12/26/2023 in [facility]  Referring Provider Plotnikov    Encounter Date: 01/08/2024  Check In:  Session Check In - 01/08/24 1429       Check-In   Supervising physician immediately available to respond to emergencies [facility] MD immediately available    Physician(s) Josefa Beauvais, NP    [facility]    Staff Present [name], RN, BSN;Casey Claudene, RT;Randi [facility] BS, ACSM-CEP, Exercise Physiologist;Rabiah Goeser Nicholaus, MS, ACSM-CEP, Exercise Physiologist    Virtual Visit No    Medication changes reported     No    Fall or balance concerns reported    No    Tobacco Cessation No Change    Warm-up and Cool-down Performed as group-led instruction    Resistance Training Performed Yes    VAD Patient? No    PAD/SET Patient? No      Pain Assessment   Currently in Pain? No/denies    Multiple Pain Sites No          Capillary Blood Glucose: No results found for this or any previous visit (from the past 24 hours).    Social History   Tobacco Use  Smoking Status Former   Current packs/day: 0.00   Types: Cigarettes   Quit date: 08/21/2023   Years since quitting: 0.3  Smokeless Tobacco Never  Tobacco Comments   Quit smoking July 2025    Goals Met:  Proper associated with RPD/PD & O2 Sat Exercise tolerated well No report of concerns or symptoms today Strength training completed today  Goals Unmet:  Not Applicable  Comments: Service time is from 1320 to 1445.    Dr. Slater Staff is Medical Director for Pulmonary Rehab at [facility].

## 2024-01-10 ENCOUNTER — Encounter (HOSPITAL_COMMUNITY)
Admission: RE | Admit: 2024-01-10 | Discharge: 2024-01-10 | Disposition: A | Discharge status: Home / Self Care | Source: Ambulatory Visit | Attending: Internal Medicine | Admitting: Internal Medicine

## 2024-01-10 DIAGNOSIS — J449 Chronic obstructive pulmonary disease, unspecified: Secondary | ICD-10-CM

## 2024-01-10 NOTE — Progress Notes (Signed)
 Daily Session Note  Patient Details  Name: Derreck Wiltsey MRN: [id number] Date of Birth: 1950/06/08 Referring Provider:   Conrad Ports Pulmonary Rehab Walk Test from 12/26/2023 in [facility]  Referring Provider Plotnikov    Encounter Date: 01/10/2024  Check In:  Session Check In - 01/10/24 1325       Check-In   Supervising physician immediately available to respond to emergencies [facility] MD immediately available    Physician(s) Orren Fabry, PA    Location [facility]    Staff Present [name], RN, BSN;Casey Claudene, RT;Randi Midge BS, ACSM-CEP, Exercise Physiologist;Analiza Cowger Nicholaus, MS, ACSM-CEP, Exercise Physiologist    Virtual Visit No    Medication changes reported     No    Fall or balance concerns reported    No    Tobacco Cessation No Change    Warm-up and Cool-down Performed as group-led instruction    Resistance Training Performed Yes    VAD Patient? No    PAD/SET Patient? No      Pain Assessment   Currently in Pain? No/denies    Multiple Pain Sites No          Capillary Blood Glucose: No results found for this or any previous visit (from the past 24 hours).    Social History   Tobacco Use  Smoking Status Former   Current packs/day: 0.00   Types: Cigarettes   Quit date: 08/21/2023   Years since quitting: 0.3  Smokeless Tobacco Never  Tobacco Comments   Quit smoking July 2025    Goals Met:  Proper associated with RPD/PD & O2 Sat Exercise tolerated well No report of concerns or symptoms today Strength training completed today  Goals Unmet:  Not Applicable  Comments: Service time is from 1304 to 1442.    Dr. Slater Staff is Medical Director for Pulmonary Rehab at [facility].

## 2024-01-15 ENCOUNTER — Encounter (HOSPITAL_COMMUNITY)
Admission: RE | Admit: 2024-01-15 | Discharge: 2024-01-15 | Disposition: A | Discharge status: Home / Self Care | Source: Ambulatory Visit | Attending: Internal Medicine | Admitting: Internal Medicine

## 2024-01-15 VITALS — Wt 166.2 lb

## 2024-01-15 DIAGNOSIS — J449 Chronic obstructive pulmonary disease, unspecified: Secondary | ICD-10-CM | POA: Diagnosis not present

## 2024-01-15 NOTE — Progress Notes (Signed)
 Daily Session Note  Patient Details  Name: Travis Hicks MRN: [id number] Date of Birth: 11/27/1950 Referring Provider:   Conrad Ports Pulmonary Rehab Walk Test from 12/26/2023 in [facility]  Referring Provider Plotnikov    Encounter Date: 01/15/2024  Check In:  Session Check In - 01/15/24 1423       Check-In   Supervising physician immediately available to respond to emergencies [facility] MD immediately available    Physician(s) Rosabel Mose, NP    [facility]    Staff Present [name], RN, BSN;Casey Claudene, RT;Randi Worthing BS, ACSM-CEP, Exercise Physiologist;Luster Hechler [place], MS, ACSM-CEP, Exercise Physiologist    Virtual Visit No    Medication changes reported     No    Fall or balance concerns reported    No    Tobacco Cessation No Change    Warm-up and Cool-down Performed as group-led instruction    Resistance Training Performed Yes    VAD Patient? No    PAD/SET Patient? No      Pain Assessment   Currently in Pain? No/denies    Multiple Pain Sites No          Capillary Blood Glucose: No results found for this or any previous visit (from the past 24 hours).   Exercise Prescription Changes - 01/15/24 1500       Response to Exercise   Blood Pressure (Admit) 119/84    Blood Pressure (Exercise) 138/90    Blood Pressure (Exit) 124/80    Heart Rate (Admit) 94 bpm    Heart Rate (Exercise) 117 bpm    Heart Rate (Exit) 104 bpm    Oxygen Saturation (Admit) 96 %    Oxygen Saturation (Exercise) 93 %    Oxygen Saturation (Exit) 94 %    Rating of Perceived Exertion (Exercise) 17    Perceived Dyspnea (Exercise) 3    Duration Continue with 30 min of aerobic exercise without signs/symptoms of physical distress.    Intensity THRR unchanged      Progression   Progression Continue to progress workloads to maintain intensity without signs/symptoms of physical distress.      Resistance Training    Training Prescription Yes    Weight red bands    Reps 10-15    Time 10 Minutes      Oxygen   Oxygen Continuous    Liters 2      Recumbant Bike   Level 1    Minutes 15    METs 2.2      NuStep   Level 1    Minutes 15    METs 2.2      Oxygen   Maintain Oxygen Saturation 88% or higher          Social History   Tobacco Use  Smoking Status Former   Current packs/day: 0.00   Types: Cigarettes   Quit date: 08/21/2023   Years since quitting: 0.4  Smokeless Tobacco Never  Tobacco Comments   Quit smoking July 2025    Goals Met:  Proper associated with RPD/PD & O2 Sat Exercise tolerated well No report of concerns or symptoms today Strength training completed today  Goals Unmet:  Not Applicable  Comments: Service time is from 1314 to 1443.    Dr. Slater Staff is Medical Director for Pulmonary Rehab at [facility].

## 2024-01-16 NOTE — Progress Notes (Signed)
 Pulmonary Individual Treatment Plan  Patient Details  Name: Travis Hicks MRN: [id number] Date of Birth: 1951/01/31 Referring Provider:   Conrad Ports Pulmonary Rehab Walk Test from 12/26/2023 in [facility]  Referring Provider Plotnikov    Initial Encounter Date:  Flowsheet Row Pulmonary Rehab Walk Test from 12/26/2023 in [facility]  Date 12/26/23    Visit Diagnosis: Stage 3 severe COPD by GOLD classification (HCC)  Patient's Home Medications on Admission:   Current Outpatient Medications:    albuterol  (VENTOLIN  HFA) 108 (90 Base) MCG/ACT inhaler, Inhale 2 puffs into the lungs every 6 (six) hours as needed for wheezing or shortness of breath., Disp: 8 g, Rfl: 11   b complex vitamins tablet, Take 1 tablet by mouth daily., Disp: 100 tablet, Rfl: 3   Cholecalciferol (VITAMIN D3) 50 MCG (2000 UT) capsule, Take 2 capsules (4,000 Units total) by mouth daily., Disp: 100 capsule, Rfl: 3   guaiFENesin  (MUCINEX ) 600 MG 12 hr tablet, Take 1 tablet (600 mg total) by mouth 2 (two) times daily., Disp: 60 tablet, Rfl: 1   ipratropium-albuterol  (DUONEB) 0.5-2.5 (3) MG/3ML SOLN, Take 3 mLs by nebulization every 4 (four) hours as needed., Disp: 360 mL, Rfl: 6   latanoprost  (XALATAN ) 0.005 % ophthalmic solution, Place 1 drop into the left eye at bedtime., Disp: , Rfl:    Multiple Vitamin (MULTIVITAMIN WITH MINERALS) TABS tablet, Take 1 tablet by mouth daily., Disp: 30 tablet, Rfl: 0   TRELEGY ELLIPTA  100-62.5-25 MCG/ACT AEPB, TAKE 1 PUFF BY MOUTH EVERY DAY, Disp: 60 each, Rfl: 5  Past Medical History: Past Medical History:  Diagnosis Date   Cataract    veru small per pt .   Emphysema of lung (HCC)    Glaucoma 2018   Overactive bladder     Tobacco Use: Social History   Tobacco Use  Smoking Status Former   Current packs/day: 0.00   Types: Cigarettes   Quit date: 08/21/2023    Years since quitting: 0.4  Smokeless Tobacco Never  Tobacco Comments   Quit smoking July 2025    Labs: Review Flowsheet  More data exists      Latest Ref Rng & Units 01/14/2019 03/05/2020 09/26/2021 12/14/2022 12/18/2023  Labs for ITP Cardiac and Pulmonary Rehab  Cholestrol 0 - 200 mg/dL 803  859  - 796  811   LDL (calc) 0 - 99 mg/dL 60  43  - 897  68   HDL-C >39.00 mg/dL 878.29  73  - 23.89  02.69   Trlycerides 0.0 - 149.0 mg/dL 28.9  833  - 875.9  883.9   Bicarbonate 20.0 - 28.0 mmol/L - - 32.6  - -  O2 Saturation % - - 86.6  - -    Capillary Blood Glucose: No results found for: GLUCAP   Pulmonary Assessment Scores:  Pulmonary Assessment Scores     Row Name 12/26/23 1432         ADL UCSD   ADL Phase Entry     SOB Score total 91       CAT Score   CAT Score 22       mMRC Score   mMRC Score 4       UCSD: Self-administered rating of dyspnea associated with activities of daily living (ADLs) 6-point scale (0 = not at all to 5 = maximal or unable to do because of breathlessness)  Scoring Scores  range from 0 to 120.  Minimally important difference is 5 units  CAT: CAT can identify the health impairment of COPD patients and is better correlated with disease progression.  CAT has a scoring range of zero to 40. The CAT score is classified into four groups of low (less than 10), medium (10 - 20), high (21-30) and very high (31-40) based on the impact level of disease on health status. A CAT score over 10 suggests significant symptoms.  A worsening CAT score could be explained by an exacerbation, poor medication adherence, poor inhaler technique, or progression of COPD or comorbid conditions.  CAT MCID is 2 points  mMRC: mMRC (Modified Medical Research Council) Dyspnea Scale is used to assess the degree of baseline functional disability in patients of respiratory disease due to dyspnea. No minimal important difference is established. A decrease in score of 1 point or  greater is considered a positive change.   Pulmonary Function Assessment:  Pulmonary Function Assessment - 12/26/23 1341       Breath   Bilateral Breath Sounds Clear;Decreased    Shortness of Breath Yes;Limiting activity;Fear of Shortness of Breath;Panic with Shortness of Breath          Exercise Target Goals: Exercise Program Goal: Individual exercise prescription set using results from initial 6 min walk test and THRR while considering  patient's activity barriers and safety.   Exercise Prescription Goal: Initial exercise prescription builds to 30-45 minutes a day of aerobic activity, 2-3 days per week.  Home exercise guidelines will be given to patient during program as part of exercise prescription that the participant will acknowledge.  Activity Barriers & Risk Stratification:  Activity Barriers & Cardiac Risk Stratification - 12/26/23 1339       Activity Barriers & Cardiac Risk Stratification   Activity Barriers Deconditioning;Muscular Weakness;Shortness of Breath    Cardiac Risk Stratification Moderate          6 Minute Walk:  6 Minute Walk     Row Name 12/26/23 1427         6 Minute Walk   Phase Initial     Distance 630 feet     Walk Time 6 minutes     # of Rest Breaks 4  1:49-2:12; 2:46-3:10; 4:11-4:25; 5:12-5:44     MPH 1.19     METS 2.59     RPE 13     Perceived Dyspnea  4     VO2 Peak 9.06     Resting HR 90 bpm     Resting BP 140/70     Resting Oxygen Saturation  95 %     Exercise Oxygen Saturation  during 6 min walk 93 %     Max Ex. HR 132 bpm     Max Ex. BP 152/80     2 Minute Post BP 150/80       Interval HR   1 Minute HR 132     2 Minute HR 109     3 Minute HR 132     4 Minute HR 132     5 Minute HR 131     6 Minute HR 118     2 Minute Post HR 110     Interval Heart Rate? Yes       Interval Oxygen   Interval Oxygen? Yes     Baseline Oxygen Saturation % 95 %     1 Minute Oxygen Saturation % 96 %     1 Minute Liters  of Oxygen 2 L      2 Minute Oxygen Saturation % 94 %     2 Minute Liters of Oxygen 2 L     3 Minute Oxygen Saturation % 94 %     3 Minute Liters of Oxygen 2 L     4 Minute Oxygen Saturation % 94 %     4 Minute Liters of Oxygen 2 L     5 Minute Oxygen Saturation % 93 %     5 Minute Liters of Oxygen 2 L     6 Minute Oxygen Saturation % 93 %     6 Minute Liters of Oxygen 2 L     2 Minute Post Oxygen Saturation % 95 %     2 Minute Post Liters of Oxygen 2 L        Oxygen Initial Assessment:  Oxygen Initial Assessment - 12/26/23 1339       Home Oxygen   Home Oxygen Device Home Concentrator;Portable Concentrator    Sleep Oxygen Prescription Continuous    Liters per minute 2    Home Exercise Oxygen Prescription Continuous    Liters per minute 2    Home Resting Oxygen Prescription Continuous    Liters per minute 2    Compliance with Home Oxygen Use Yes      Initial 6 min Walk   Oxygen Used Continuous    Liters per minute 2      Program Oxygen Prescription   Program Oxygen Prescription Continuous    Liters per minute 2      Intervention   Short Term Goals To learn and exhibit compliance with exercise, home and travel O2 prescription;To learn and understand importance of maintaining oxygen saturations>88%;To learn and demonstrate proper use of respiratory medications;To learn and understand importance of monitoring SPO2 with pulse oximeter and demonstrate accurate use of the pulse oximeter.;To learn and demonstrate proper pursed lip breathing techniques or other breathing techniques.     Long  Term Goals Exhibits compliance with exercise, home  and travel O2 prescription;Maintenance of O2 saturations>88%;Compliance with respiratory medication;Verbalizes importance of monitoring SPO2 with pulse oximeter and return demonstration;Exhibits proper breathing techniques, such as pursed lip breathing or other method taught during program session;Demonstrates proper use of MDI's          Oxygen  Re-Evaluation:  Oxygen Re-Evaluation     Row Name 01/07/24 1337             Program Oxygen Prescription   Program Oxygen Prescription Continuous       Liters per minute 2         Home Oxygen   Home Oxygen Device Home Concentrator;Portable Concentrator       Sleep Oxygen Prescription Continuous       Liters per minute 2       Home Exercise Oxygen Prescription Continuous       Liters per minute 2       Home Resting Oxygen Prescription Continuous       Liters per minute 2       Compliance with Home Oxygen Use Yes         Goals/Expected Outcomes   Short Term Goals To learn and exhibit compliance with exercise, home and travel O2 prescription;To learn and understand importance of maintaining oxygen saturations>88%;To learn and demonstrate proper use of respiratory medications;To learn and understand importance of monitoring SPO2 with pulse oximeter and demonstrate accurate use of the pulse oximeter.;To learn and demonstrate proper  pursed lip breathing techniques or other breathing techniques.        Long  Term Goals Exhibits compliance with exercise, home  and travel O2 prescription;Maintenance of O2 saturations>88%;Compliance with respiratory medication;Verbalizes importance of monitoring SPO2 with pulse oximeter and return demonstration;Exhibits proper breathing techniques, such as pursed lip breathing or other method taught during program session;Demonstrates proper use of MDI's       Goals/Expected Outcomes Compliance and understanding of oxygen saturation monitoring and breathing techniques to decrease shortness of breath.          Oxygen Discharge (Final Oxygen Re-Evaluation):  Oxygen Re-Evaluation - 01/07/24 1337       Program Oxygen Prescription   Program Oxygen Prescription Continuous    Liters per minute 2      Home Oxygen   Home Oxygen Device Home Concentrator;Portable Concentrator    Sleep Oxygen Prescription Continuous    Liters per minute 2    Home Exercise Oxygen  Prescription Continuous    Liters per minute 2    Home Resting Oxygen Prescription Continuous    Liters per minute 2    Compliance with Home Oxygen Use Yes      Goals/Expected Outcomes   Short Term Goals To learn and exhibit compliance with exercise, home and travel O2 prescription;To learn and understand importance of maintaining oxygen saturations>88%;To learn and demonstrate proper use of respiratory medications;To learn and understand importance of monitoring SPO2 with pulse oximeter and demonstrate accurate use of the pulse oximeter.;To learn and demonstrate proper pursed lip breathing techniques or other breathing techniques.     Long  Term Goals Exhibits compliance with exercise, home  and travel O2 prescription;Maintenance of O2 saturations>88%;Compliance with respiratory medication;Verbalizes importance of monitoring SPO2 with pulse oximeter and return demonstration;Exhibits proper breathing techniques, such as pursed lip breathing or other method taught during program session;Demonstrates proper use of MDI's    Goals/Expected Outcomes Compliance and understanding of oxygen saturation monitoring and breathing techniques to decrease shortness of breath.          Initial Exercise Prescription:  Initial Exercise Prescription - 12/26/23 1400       Date of Initial Exercise RX and Referring Provider   Date 12/26/23    Referring Provider Plotnikov    Expected Discharge Date 04/01/24      Oxygen   Oxygen Continuous    Liters 2    Maintain Oxygen Saturation 88% or higher      Recumbant Bike   Level 1    RPM 56    Minutes 15    METs 1.8      NuStep   Level 1    SPM 67    Minutes 15    METs 1.9      Prescription Details   Frequency (times per week) 2    Duration Progress to 30 minutes of continuous aerobic without signs/symptoms of physical distress      Intensity   THRR 40-80% of Max Heartrate 59-118    Ratings of Perceived Exertion 11-13    Perceived Dyspnea 0-4       Progression   Progression Continue to progress workloads to maintain intensity without signs/symptoms of physical distress.      Resistance Training   Training Prescription No          Perform Capillary Blood Glucose checks as needed.  Exercise Prescription Changes:   Exercise Prescription Changes     Row Name 01/01/24 1500 01/15/24 1500  Response to Exercise   Blood Pressure (Admit) 130/80 119/84      Blood Pressure (Exercise) 140/80 138/90      Blood Pressure (Exit) 120/70 124/80      Heart Rate (Admit) 81 bpm 94 bpm      Heart Rate (Exercise) 98 bpm 117 bpm      Heart Rate (Exit) 87 bpm 104 bpm      Oxygen Saturation (Admit) 93 % 96 %      Oxygen Saturation (Exercise) 96 % 93 %      Oxygen Saturation (Exit) 96 % 94 %      Rating of Perceived Exertion (Exercise) 10 17      Perceived Dyspnea (Exercise) 3 3      Duration Continue with 30 min of aerobic exercise without signs/symptoms of physical distress. Continue with 30 min of aerobic exercise without signs/symptoms of physical distress.      Intensity THRR unchanged THRR unchanged        Progression   Progression Continue to progress workloads to maintain intensity without signs/symptoms of physical distress. Continue to progress workloads to maintain intensity without signs/symptoms of physical distress.        Resistance Training   Training Prescription -- Yes      Weight -- red bands      Reps -- 10-15      Time -- 10 Minutes        Oxygen   Oxygen Continuous Continuous      Liters 2 2        Recumbant Bike   Level -- 1      Minutes -- 15      METs -- 2.2        NuStep   Level 1 1      SPM 66 --      Minutes 15 15      METs 1.8 2.2        Oxygen   Maintain Oxygen Saturation 88% or higher 88% or higher         Exercise Comments:   Exercise Comments     Row Name 01/01/24 1536           Exercise Comments Pt completed first day of group exercise. He exercised on the recumbent  stepper for 30 min, level 1, METs 1.8. He needed a rest break as well as sitting rests during warm up and cool down. He used blue bands. Will progress as tolerated.          Exercise Goals and Review:   Exercise Goals     Row Name 12/26/23 1316             Exercise Goals   Increase Physical Activity Yes       Intervention Provide advice, education, support and counseling about physical activity/exercise needs.;Develop an individualized exercise prescription for aerobic and resistive training based on initial evaluation findings, risk stratification, comorbidities and participant's personal goals.       Expected Outcomes Short Term: Attend rehab on a regular basis to increase amount of physical activity.;Long Term: Exercising regularly at least 3-5 days a week.;Long Term: Add in home exercise to make exercise part of routine and to increase amount of physical activity.       Increase Strength and Stamina Yes       Intervention Provide advice, education, support and counseling about physical activity/exercise needs.;Develop an individualized exercise prescription for aerobic and resistive training based on initial evaluation findings,  risk stratification, comorbidities and participant's personal goals.       Expected Outcomes Short Term: Perform resistance training exercises routinely during rehab and add in resistance training at home;Long Term: Improve cardiorespiratory fitness, muscular endurance and strength as measured by increased METs and functional capacity ( );Short Term: Increase workloads from initial exercise prescription for resistance, speed, and METs.       Able to understand and use rate of perceived exertion (RPE) scale Yes       Intervention Provide education and explanation on how to use RPE scale       Expected Outcomes Short Term: Able to use RPE daily in rehab to express subjective intensity level;Long Term:  Able to use RPE to guide intensity level when exercising  independently       Able to understand and use Dyspnea scale Yes       Intervention Provide education and explanation on how to use Dyspnea scale       Expected Outcomes Short Term: Able to use Dyspnea scale daily in rehab to express subjective sense of shortness of breath during exertion;Long Term: Able to use Dyspnea scale to guide intensity level when exercising independently       Knowledge and understanding of Target Heart Rate Range (THRR) Yes       Intervention Provide education and explanation of THRR including how the numbers were predicted and where they are located for reference       Expected Outcomes Short Term: Able to state/look up THRR;Long Term: Able to use THRR to govern intensity when exercising independently;Short Term: Able to use daily as guideline for intensity in rehab       Understanding of Exercise Prescription Yes       Intervention Provide education, explanation, and written materials on patient's individual exercise prescription       Expected Outcomes Short Term: Able to explain program exercise prescription;Long Term: Able to explain home exercise prescription to exercise independently          Exercise Goals Re-Evaluation :  Exercise Goals Re-Evaluation     Row Name 01/07/24 1335             Exercise Goal Re-Evaluation   Exercise Goals Review Increase Physical Activity;Able to understand and use Dyspnea scale;Understanding of Exercise Prescription;Increase Strength and Stamina;Knowledge and understanding of Target Heart Rate Range (THRR);Able to understand and use rate of perceived exertion (RPE) scale       Comments Pt has completed 2 exercise sessions. He is exercising on the recumbent stepper for 15 min, level 1, METs 1.8. He then is exercising on the recumbent bike for 15 min, level 1, METs 1.8. It is too early to discern progression however pt had to sit less for warm up and cool down his past session compared to the first. Using red bands for resistance  training, 3.7 lbs. Will progress as tolerated.       Expected Outcomes Through exercise at rehab and home, the patient will decrease shortness of breath with daily activities and feel confident in carrying out an exercise regimen at home.          Discharge Exercise Prescription (Final Exercise Prescription Changes):  Exercise Prescription Changes - 01/15/24 1500       Response to Exercise   Blood Pressure (Admit) 119/84    Blood Pressure (Exercise) 138/90    Blood Pressure (Exit) 124/80    Heart Rate (Admit) 94 bpm    Heart Rate (Exercise) 117 bpm  Heart Rate (Exit) 104 bpm    Oxygen Saturation (Admit) 96 %    Oxygen Saturation (Exercise) 93 %    Oxygen Saturation (Exit) 94 %    Rating of Perceived Exertion (Exercise) 17    Perceived Dyspnea (Exercise) 3    Duration Continue with 30 min of aerobic exercise without signs/symptoms of physical distress.    Intensity THRR unchanged      Progression   Progression Continue to progress workloads to maintain intensity without signs/symptoms of physical distress.      Resistance Training   Training Prescription Yes    Weight red bands    Reps 10-15    Time 10 Minutes      Oxygen   Oxygen Continuous    Liters 2      Recumbant Bike   Level 1    Minutes 15    METs 2.2      NuStep   Level 1    Minutes 15    METs 2.2      Oxygen   Maintain Oxygen Saturation 88% or higher          Nutrition:  Target Goals: Understanding of nutrition guidelines, daily intake of sodium 1500mg , cholesterol 200mg , calories 30% from fat and 7% or less from saturated fats, daily to have 5 or more servings of fruits and vegetables.  Biometrics:  Pre Biometrics - 12/26/23 1438       Pre Biometrics   Grip Strength 34 kg           Nutrition Therapy Plan and Nutrition Goals:  Nutrition Therapy & Goals - 01/01/24 1437       Nutrition Therapy   Diet Regular diet      Personal Nutrition Goals   Nutrition Goal Patient to identify  strategies for weight gain of 0.5-2 # per week.    Personal Goal #2 Patient to include rich source of protein with each meal/snack.    Comments Patient with history of stage 3 COPD, malnutrition. Reports difficulty maintaining weight due to lack of appetite and early satiety. Reports loss of taste/smell over past 8-10 years which complicates intake. Currently underweight based on recommended BMI of 23-27 kg/m2 for >= 65 years. Pt verbalizes motivation to increase frequency of small meals including sources of protein such as dairy, chicken, and fish.      Intervention Plan   Intervention Prescribe, educate and counsel regarding individualized specific dietary modifications aiming towards targeted core components such as weight, hypertension, lipid management, diabetes, heart failure and other comorbidities.;Nutrition handout(s) given to patient.   Handouts: Lean Protein for COPD, Weight Gain and Weight Maintenance   Expected Outcomes Short Term Goal: Understand basic principles of dietary content, such as calories, fat, sodium, cholesterol and nutrients.;Long Term Goal: Adherence to prescribed nutrition plan.          Nutrition Assessments:  MEDIFICTS Score Key: >=70 Need to make dietary changes  40-70 Heart Healthy Diet <= 40 Therapeutic Level Cholesterol Diet   Picture Your Plate Scores: <59 Unhealthy dietary pattern with much room for improvement. 41-50 Dietary pattern unlikely to meet recommendations for good health and room for improvement. 51-60 More healthful dietary pattern, with some room for improvement.  >60 Healthy dietary pattern, although there may be some specific behaviors that could be improved.    Nutrition Goals Re-Evaluation:   Nutrition Goals Discharge (Final Nutrition Goals Re-Evaluation):   Psychosocial: Target Goals: Acknowledge presence or absence of significant depression and/or stress, maximize coping skills, provide  positive support system. Participant is  able to verbalize types and ability to use techniques and skills needed for reducing stress and depression.  Initial Review & Psychosocial Screening:  Initial Psych Review & Screening - 12/26/23 1337       Initial Review   Current issues with None Identified      Family Dynamics   Good Support System? Yes    Comments Support from his family      Barriers   Psychosocial barriers to participate in program There are no identifiable barriers or psychosocial needs.      Screening Interventions   Interventions Encouraged to exercise    Expected Outcomes Short Term goal: Identification and review with participant of any Quality of Life or Depression concerns found by scoring the questionnaire.;Long Term goal: The participant improves quality of Life and PHQ9 Scores as seen by post scores and/or verbalization of changes          Quality of Life Scores:  Scores of 19 and below usually indicate a poorer quality of life in these areas.  A difference of  2-3 points is a clinically meaningful difference.  A difference of 2-3 points in the total score of the Quality of Life Index has been associated with significant improvement in overall quality of life, self-image, physical symptoms, and general health in studies assessing change in quality of life.  PHQ-9: Review Flowsheet  More data exists      12/26/2023 12/14/2022 11/30/2022 01/09/2022 07/08/2021  Depression screen PHQ 2/9  Decreased Interest 2 2 3  0 0 3  Down, Depressed, Hopeless 0 0 0 0 0 0  PHQ - 2 Score 2 2 3  0 0 3  Altered sleeping 1 1 0 2 1  Tired, decreased energy 0 3 2 0 2  Change in appetite 0 1 3 2 3   Feeling bad or failure about yourself  0 0 0 0 0  Trouble concentrating 0 3 2 0 0  Moving slowly or fidgety/restless 0 0 0 0 0  Suicidal thoughts 0 0 0 0 0  PHQ-9 Score 3  10  10  4  9    Difficult doing work/chores Not difficult at all Somewhat difficult - Not difficult at all Somewhat difficult    Details       Data  saved with a previous flowsheet row definition   Multiple values from one day are sorted in reverse-chronological order        Interpretation of Total Score  Total Score Depression Severity:  1-4 = Minimal depression, 5-9 = Mild depression, 10-14 = Moderate depression, 15-19 = Moderately severe depression, 20-27 = Severe depression   Psychosocial Evaluation and Intervention:  Psychosocial Evaluation - 12/26/23 1437       Psychosocial Evaluation & Interventions   Interventions Encouraged to exercise with the program and follow exercise prescription    Comments Monterrio denies any psychosocial barriers or concerns at this time. He has good support from his wife and family. No needs at this time.    Expected Outcomes For Anshul to participate in PR free of any psychosocial barriers or concerns    Continue Psychosocial Services  No Follow up required          Psychosocial Re-Evaluation:  Psychosocial Re-Evaluation     Row Name 01/07/24 1510             Psychosocial Re-Evaluation   Current issues with None Identified       Comments Monthly psy/soc re-evaluation is  as follows: Marquis denies any psychosocial barriers or concerns at this time. He has good support from his wife and family. He declines needing any additional information or referrals.       Expected Outcomes For Osmond to participate in PR free of any psychosocial barriers or concerns.       Interventions Encouraged to attend Pulmonary Rehabilitation for the exercise       Continue Psychosocial Services  No Follow up required          Psychosocial Discharge (Final Psychosocial Re-Evaluation):  Psychosocial Re-Evaluation - 01/07/24 1510       Psychosocial Re-Evaluation   Current issues with None Identified    Comments Monthly psy/soc re-evaluation is as follows: Gretta denies any psychosocial barriers or concerns at this time. He has good support from his wife and family. He declines needing any additional information or  referrals.    Expected Outcomes For Jaquel to participate in PR free of any psychosocial barriers or concerns.    Interventions Encouraged to attend Pulmonary Rehabilitation for the exercise    Continue Psychosocial Services  No Follow up required          Education: Education Goals: Education classes will be provided on a weekly basis, covering required topics. Participant will state understanding/return demonstration of topics presented.  Learning Barriers/Preferences:  Learning Barriers/Preferences - 12/26/23 1338       Learning Barriers/Preferences   Learning Barriers Sight    Learning Preferences None          Education Topics: Know Your Numbers Group instruction that is supported by a PowerPoint presentation. Instructor discusses importance of knowing and understanding resting, exercise, and post-exercise oxygen saturation, heart rate, and blood pressure. Oxygen saturation, heart rate, blood pressure, rating of perceived exertion, and dyspnea are reviewed along with a normal range for these values.    Exercise for the Pulmonary Patient Group instruction that is supported by a PowerPoint presentation. Instructor discusses benefits of exercise, core components of exercise, frequency, duration, and intensity of an exercise routine, importance of utilizing pulse oximetry during exercise, safety while exercising, and options of places to exercise outside of rehab.    MET Level  Group instruction provided by PowerPoint, verbal discussion, and written material to support subject matter. Instructor reviews what METs are and how to increase METs.    Pulmonary Medications Verbally interactive group education provided by instructor with focus on inhaled medications and proper administration.   Anatomy and Physiology of the Respiratory System Group instruction provided by PowerPoint, verbal discussion, and written material to support subject matter. Instructor reviews respiratory  cycle and anatomical components of the respiratory system and their functions. Instructor also reviews differences in obstructive and restrictive respiratory diseases with examples of each.  Flowsheet Row PULMONARY REHAB CHRONIC OBSTRUCTIVE PULMONARY DISEASE from 01/10/2024 in [facility]  Date 01/10/24  Educator RT  Instruction Review Code 1- Verbalizes Understanding    Oxygen Safety Group instruction provided by PowerPoint, verbal discussion, and written material to support subject matter. There is an overview of "What is Oxygen" and "Why do we need it".  Instructor also reviews how to create a safe environment for oxygen use, the importance of using oxygen as prescribed, and the risks of noncompliance. There is a brief discussion on traveling with oxygen and resources the patient may utilize.   Oxygen Use Group instruction provided by PowerPoint, verbal discussion, and written material to discuss how supplemental oxygen is  prescribed and different types of oxygen supply systems. Resources for more information are provided.    Breathing Techniques Group instruction that is supported by demonstration and informational handouts. Instructor discusses the benefits of pursed lip and diaphragmatic breathing and detailed demonstration on how to perform both.     Risk Factor Reduction Group instruction that is supported by a PowerPoint presentation. Instructor discusses the definition of a risk factor, different risk factors for pulmonary disease, and how the heart and lungs work together.   Pulmonary Diseases Group instruction provided by PowerPoint, verbal discussion, and written material to support subject matter. Instructor gives an overview of the different type of pulmonary diseases. There is also a discussion on risk factors and symptoms as well as ways to manage the diseases. Flowsheet Row PULMONARY REHAB CHRONIC OBSTRUCTIVE  PULMONARY DISEASE from 01/03/2024 in [facility] for Heart, Vascular, & Lung Health  Date 01/03/24  Educator RT  Instruction Review Code 1- Verbalizes Understanding    Stress and Energy Conservation Group instruction provided by PowerPoint, verbal discussion, and written material to support subject matter. Instructor gives an overview of stress and the impact it can have on the body. Instructor also reviews ways to reduce stress. There is also a discussion on energy conservation and ways to conserve energy throughout the day.   Warning Signs and Symptoms Group instruction provided by PowerPoint, verbal discussion, and written material to support subject matter. Instructor reviews warning signs and symptoms of stroke, heart attack, cold and flu. Instructor also reviews ways to prevent the spread of infection.   Other Education Group or individual verbal, written, or video instructions that support the educational goals of the pulmonary rehab program.    Knowledge Questionnaire Score:  Knowledge Questionnaire Score - 12/26/23 1433       Knowledge Questionnaire Score   Pre Score 15/18          Core Components/Risk Factors/Patient Goals at Admission:  Personal Goals and Risk Factors at Admission - 12/26/23 1317       Core Components/Risk Factors/Patient Goals on Admission   Improve shortness of breath with ADL's Yes    Intervention Provide education, individualized exercise plan and daily activity instruction to help decrease symptoms of SOB with activities of daily living.    Expected Outcomes Short Term: Improve cardiorespiratory fitness to achieve a reduction of symptoms when performing ADLs;Long Term: Be able to perform more ADLs without symptoms or delay the onset of symptoms          Core Components/Risk Factors/Patient Goals Review:   Goals and Risk Factor Review     Row Name 01/07/24 1511             Core Components/Risk Factors/Patient  Goals Review   Personal Goals Review Improve shortness of breath with ADL's;Develop more efficient breathing techniques such as purse lipped breathing and diaphragmatic breathing and practicing self-pacing with activity.       Review Monthly review of patient's Core Components/Risk Factors/Patient Goals are as follows: Goal progressing for improving his shortness of breath with ADLs. Trooper is exercising on the [place] for 30 minutes. He has completed 2 sessions so far and is building up his endurance. Danni can report his rate of perceived exertion and his dyspnea level to staff. Goal progressing on developing more efficient breathing techniques such as purse lipped breathing and diaphragmatic breathing and practicing self-pacing with activity. Staff is working with Gretta on when to and how to correctly perform purse lipped breathing.  He is still learning diaphragmatic breathing and is practicing at home. Terrance enjoys exercising and working hard in the [employer]. He will continue to benefit from [employer] for exercise, education, and nutrition.       Expected Outcomes Pt will show progress toward meeting expected goals and outcomes.          Core Components/Risk Factors/Patient Goals at Discharge (Final Review):   Goals and Risk Factor Review - 01/07/24 1511       Core Components/Risk Factors/Patient Goals Review   Personal Goals Review Improve shortness of breath with ADL's;Develop more efficient breathing techniques such as purse lipped breathing and diaphragmatic breathing and practicing self-pacing with activity.    Review Monthly review of patient's Core Components/Risk Factors/Patient Goals are as follows: Goal progressing for improving his shortness of breath with ADLs. Winfield is exercising on the [place] for 30 minutes. He has completed 2 sessions so far and is building up his endurance. Tytan can report his rate of perceived exertion and his dyspnea level to staff. Goal progressing on developing  more efficient breathing techniques such as purse lipped breathing and diaphragmatic breathing and practicing self-pacing with activity. Staff is working with Gretta on when to and how to correctly perform purse lipped breathing. He is still learning diaphragmatic breathing and is practicing at home. Jayzen enjoys exercising and working hard in the [employer]. He will continue to benefit from [employer] for exercise, education, and nutrition.    Expected Outcomes Pt will show progress toward meeting expected goals and outcomes.          ITP Comments: Pt is making expected progress toward Pulmonary Rehab goals after completing 5 session(s). Recommend continued exercise, life style modification, education, and utilization of breathing techniques to increase stamina and strength, while also decreasing shortness of breath with exertion.  Dr. Slater Staff is Medical Director for Pulmonary Rehab at [facility].

## 2024-01-20 ENCOUNTER — Encounter: Payer: Self-pay | Admitting: Internal Medicine

## 2024-01-20 NOTE — Assessment & Plan Note (Signed)
 In partial remission.  Doing well

## 2024-01-20 NOTE — Assessment & Plan Note (Signed)
 Continue with vitamin D

## 2024-01-22 ENCOUNTER — Encounter (HOSPITAL_COMMUNITY)
Admission: RE | Admit: 2024-01-22 | Discharge: 2024-01-22 | Disposition: A | Discharge status: Home / Self Care | Source: Ambulatory Visit | Attending: Internal Medicine | Admitting: Internal Medicine

## 2024-01-22 DIAGNOSIS — J449 Chronic obstructive pulmonary disease, unspecified: Secondary | ICD-10-CM | POA: Diagnosis present

## 2024-01-22 NOTE — Progress Notes (Signed)
 Daily Session Note  Patient Details  Name: Travis Hicks MRN: [id number] Date of Birth: 11-19-1950 Referring Provider:   Conrad Ports Pulmonary Rehab Walk Test from 12/26/2023 in [facility] for Heart, Vascular, & Lung Health  Referring Provider Plotnikov    Encounter Date: 01/22/2024  Check In:  Session Check In - 01/22/24 1324       Check-In   Supervising physician immediately available to respond to emergencies [facility] MD immediately available    Physician(s) Damien Braver, NP    Location [facility]    Staff Present [name], RN, BSN;Casey Claudene, RT;Randi [facility] BS, ACSM-CEP, Exercise Physiologist;Elexius Minar Nicholaus, MS, ACSM-CEP, Exercise Physiologist    Virtual Visit No    Medication changes reported     No    Fall or balance concerns reported    No    Tobacco Cessation No Change    Warm-up and Cool-down Performed as group-led instruction    Resistance Training Performed Yes    VAD Patient? No    PAD/SET Patient? No      Pain Assessment   Currently in Pain? No/denies    Multiple Pain Sites No          Capillary Blood Glucose: No results found for this or any previous visit (from the past 24 hours).    Social History   Tobacco Use  Smoking Status Former   Current packs/day: 0.00   Types: Cigarettes   Quit date: 08/21/2023   Years since quitting: 0.4  Smokeless Tobacco Never  Tobacco Comments   Quit smoking July 2025    Goals Met:  Proper associated with RPD/PD & O2 Sat Exercise tolerated well No report of concerns or symptoms today Strength training completed today  Goals Unmet:  Not Applicable  Comments: Service time is from 1313 to 1430.    Dr. Slater Staff is Medical Director for Pulmonary Rehab at [facility].

## 2024-01-23 ENCOUNTER — Emergency Department (HOSPITAL_COMMUNITY)

## 2024-01-23 ENCOUNTER — Ambulatory Visit: Payer: Self-pay

## 2024-01-23 ENCOUNTER — Other Ambulatory Visit: Payer: Self-pay

## 2024-01-23 ENCOUNTER — Emergency Department (HOSPITAL_COMMUNITY): Admission: EM | Admit: 2024-01-23 | Discharge: 2024-01-23 | Disposition: A | Discharge status: Home / Self Care

## 2024-01-23 DIAGNOSIS — R1031 Right lower quadrant pain: Secondary | ICD-10-CM | POA: Diagnosis present

## 2024-01-23 LAB — URINALYSIS, ROUTINE W REFLEX MICROSCOPIC
Bilirubin Urine: NEGATIVE
Glucose, UA: NEGATIVE mg/dL
Ketones, ur: NEGATIVE mg/dL
Leukocytes,Ua: NEGATIVE
Nitrite: NEGATIVE
Protein, ur: NEGATIVE mg/dL
Specific Gravity, Urine: 1.024 (ref 1.005–1.030)
pH: 5 (ref 5.0–8.0)

## 2024-01-23 LAB — COMPREHENSIVE METABOLIC PANEL WITH GFR
ALT: 22 U/L (ref 0–44)
AST: 27 U/L (ref 15–41)
Albumin: 3.5 g/dL (ref 3.5–5.0)
Alkaline Phosphatase: 51 U/L (ref 38–126)
Anion gap: 11 (ref 5–15)
BUN: 9 mg/dL (ref 8–23)
CO2: 33 mmol/L — ABNORMAL HIGH (ref 22–32)
Calcium: 9.6 mg/dL (ref 8.9–10.3)
Chloride: 97 mmol/L — ABNORMAL LOW (ref 98–111)
Creatinine, Ser: 0.98 mg/dL (ref 0.61–1.24)
GFR, Estimated: >60 mL/min (ref >60)
Glucose, Bld: 132 mg/dL — ABNORMAL HIGH (ref 70–99)
Potassium: 3.9 mmol/L (ref 3.5–5.1)
Sodium: 141 mmol/L (ref 135–145)
Total Bilirubin: 1.6 mg/dL — ABNORMAL HIGH (ref 0.0–1.2)
Total Protein: 6.2 g/dL — ABNORMAL LOW (ref 6.5–8.1)

## 2024-01-23 LAB — LIPASE, BLOOD: Lipase: 34 U/L (ref 11–51)

## 2024-01-23 LAB — CBC
HCT: 45.8 % (ref 39.0–52.0)
Hemoglobin: 15 g/dL (ref 13.0–17.0)
MCH: 31.7 pg (ref 26.0–34.0)
MCHC: 32.8 g/dL (ref 30.0–36.0)
MCV: 96.8 fL (ref 80.0–100.0)
Platelets: 257 K/uL (ref 150–400)
RBC: 4.73 MIL/uL (ref 4.22–5.81)
RDW: 13.2 % (ref 11.5–15.5)
WBC: 6.5 K/uL (ref 4.0–10.5)
nRBC: 0 % (ref 0.0–0.2)

## 2024-01-23 MED ORDER — IOHEXOL 350 MG/ML SOLN
75.0000 mL | Freq: Once | INTRAVENOUS | Status: AC | PRN
  Administered 2024-01-23: 75 mL via INTRAVENOUS

## 2024-01-23 NOTE — Telephone Encounter (Signed)
 FYI Only or Action Required?: FYI only for provider: ED advised.  Patient was last seen in primary care on 12/18/2023 by Plotnikov, Karlynn GAILS, MD.  Called Nurse Triage reporting Abdominal Pain.  Symptoms began several days ago.  Interventions attempted: Nothing.  Symptoms are: unchanged.  Triage Disposition: Go to ED Now (Notify PCP)  Patient/caregiver understands and will follow disposition?: Yes  Copied from CRM [id number]. Topic: Clinical - Red Word Triage >> Jan 23, 2024  8:46 AM Alexandria E wrote: [facility] that prompted transfer to Nurse Triage: Pain in lower right side of abdomen. Patient stated it gets worse when he lays down or when he is coughing. Pain started about 2 days ago. Reason for Disposition  [1] SEVERE pain AND [2] age > 60 years  Answer Assessment - Initial Assessment Questions Advised ED now. Patient reports will have someone to drive.  1. LOCATION: Where does it hurt?      Lower left right abdominal pain 3. ONSET: When did the pain begin? (Minutes, hours or days ago)      2 days ago 5. PATTERN Does the pain come and go, or is it constant?     Comes and goes with movement, turning, coughing, constantly sore 6. SEVERITY: How bad is the pain?  (e.g., Scale 1-10; mild, moderate, or severe)     8/10; sharp pain 9. RELIEVING/AGGRAVATING FACTORS: What makes it better or worse? (e.g., antacids, bending or twisting motion, bowel movement)     Not moving 10. OTHER SYMPTOMS: Do you have any other symptoms? (e.g., back pain, diarrhea, fever, urination pain, vomiting)       Denies n/v Reports last bm yesterday, normal loose stool, denies blood or black No injures or lifting heavy objects  Protocols used: Abdominal Pain - Male-A-AH

## 2024-01-23 NOTE — Progress Notes (Signed)
 Paper work reviewed with pt, no questions at this time, pt is calling wife for transport home and his home oxygen.

## 2024-01-23 NOTE — Discharge Instructions (Signed)
 Your CT scan looks okay.  Please follow-up with your doctor in the office.  Please return for worsening pain fever inability eat or drink.

## 2024-01-23 NOTE — ED Provider Notes (Signed)
 [place] EMERGENCY DEPARTMENT AT [facility] Provider Note   CSN: [id number] Arrival date & time: 01/23/24  1039     Patient presents with: Abdominal Pain   Travis Hicks is a 73 y.o. male.   73 year old male presents for evaluation of right lower quadrant abdominal pain for 2 days.  He states it does not radiate but gets worse when he moves.  Denies any dysuria.  States he always has loose stools.  Denies any constipation, nausea or vomiting.  Denies any other symptoms or concerns.   Abdominal Pain Associated symptoms: no chest pain, no chills, no cough, no dysuria, no fever, no hematuria, no shortness of breath, no sore throat and no vomiting        Prior to Admission medications   Medication Sig Start Date End Date Taking? Authorizing Provider  albuterol  (VENTOLIN  HFA) 108 (90 Base) MCG/ACT inhaler Inhale 2 puffs into the lungs every 6 (six) hours as needed for wheezing or shortness of breath. 03/27/23   Kara Dorn NOVAK, MD  b complex vitamins tablet Take 1 tablet by mouth daily. 01/14/13   Plotnikov, Aleksei V, MD  Cholecalciferol (VITAMIN D3) 50 MCG (2000 UT) capsule Take 2 capsules (4,000 Units total) by mouth daily. 03/11/20   Plotnikov, Aleksei V, MD  guaiFENesin  (MUCINEX ) 600 MG 12 hr tablet Take 1 tablet (600 mg total) by mouth 2 (two) times daily. 10/01/21   Regalado, Belkys A, MD  ipratropium-albuterol  (DUONEB) 0.5-2.5 (3) MG/3ML SOLN Take 3 mLs by nebulization every 4 (four) hours as needed. 03/27/23   Kara Dorn NOVAK, MD  latanoprost  (XALATAN ) 0.005 % ophthalmic solution Place 1 drop into the left eye at bedtime. 12/01/16   [provider]  Multiple Vitamin (MULTIVITAMIN WITH MINERALS) TABS tablet Take 1 tablet by mouth daily. 10/02/21   Regalado, Owen LABOR, MD  TRELEGY ELLIPTA  100-62.5-25 MCG/ACT AEPB TAKE 1 PUFF BY MOUTH EVERY DAY 06/27/23   Kara Dorn NOVAK, MD    Allergies: Donepezil  and Viagra [sildenafil citrate]    Review of Systems   Constitutional:  Negative for chills and fever.  HENT:  Negative for ear pain and sore throat.   Eyes:  Negative for pain and visual disturbance.  Respiratory:  Negative for cough and shortness of breath.   Cardiovascular:  Negative for chest pain and palpitations.  Gastrointestinal:  Positive for abdominal pain. Negative for vomiting.  Genitourinary:  Negative for dysuria and hematuria.  Musculoskeletal:  Negative for arthralgias and back pain.  Skin:  Negative for color change and rash.  Neurological:  Negative for seizures and syncope.  All other systems reviewed and are negative.   Updated Vital Signs BP 114/87   Pulse (!) 104   Temp 97.8 F (36.6 C)   Resp 19   Ht 6' 1 (1.854 m)   Wt 74.8 kg   SpO2 95%   BMI 21.77 kg/m   Physical Exam Vitals and nursing note reviewed.  Constitutional:      General: He is not in acute distress.    Appearance: He is well-developed.  HENT:     Head: Normocephalic and atraumatic.  Eyes:     Conjunctiva/sclera: Conjunctivae normal.  Cardiovascular:     Rate and Rhythm: Normal rate and regular rhythm.     Heart sounds: No murmur heard. Pulmonary:     Effort: Pulmonary effort is normal. No respiratory distress.     Breath sounds: Normal breath sounds.  Abdominal:     General: Abdomen is protuberant.  Palpations: Abdomen is soft.     Tenderness: There is abdominal tenderness in the right lower quadrant.  Musculoskeletal:        General: No swelling.     Cervical back: Neck supple.  Skin:    General: Skin is warm and dry.     Capillary Refill: Capillary refill takes less than 2 seconds.  Neurological:     Mental Status: He is alert.  Psychiatric:        Mood and Affect: Mood normal.     (all labs ordered are listed, but only abnormal results are displayed) Labs Reviewed  COMPREHENSIVE METABOLIC PANEL WITH GFR - Abnormal; Notable for the following components:      Result Value   Chloride 97 (*)    CO2 33 (*)    Glucose,  Bld 132 (*)    Total Protein 6.2 (*)    Total Bilirubin 1.6 (*)    All other components within normal limits  URINALYSIS, ROUTINE W REFLEX MICROSCOPIC - Abnormal; Notable for the following components:   Color, Urine AMBER (*)    APPearance HAZY (*)    Hgb urine dipstick SMALL (*)    Bacteria, UA RARE (*)    All other components within normal limits  LIPASE, BLOOD  CBC    EKG: None  Radiology: No results found.   Procedures   Medications Ordered in the ED  iohexol  (OMNIPAQUE ) 350 MG/ML injection 75 mL (75 mLs Intravenous Contrast Given 01/23/24 1507)                                    Medical Decision Making Patient here for eval of quadrant abdominal pain.  Lab workup fairly unremarkable.  He has a CT pending.  Did not want any pain medications while in the ER at the time. Patient signed out to oncoming provider pending remainder of workup and ultimate disposition.   Problems Addressed: Right lower quadrant abdominal pain: acute illness or injury  Amount and/or Complexity of Data Reviewed External Data Reviewed: notes.    Details: No prior ED records for review Labs: ordered. Decision-making details documented in ED Course.    Details: Ordered and reviewed and unremarkable Radiology: ordered and independent interpretation performed. Decision-making details documented in ED Course.    Details: Ordered and pending  Risk OTC drugs. Prescription drug management.     Final diagnoses:  Right lower quadrant abdominal pain    ED Discharge Orders     None          Gennaro Duwaine CROME, DO 01/23/24 1515

## 2024-01-23 NOTE — ED Provider Notes (Signed)
 Received patient in turnover from Dr. Gennaro.  Please see their note for further details of Hx, PE.  Briefly patient is a 73 y.o. male with a Abdominal Pain .  RLQ abdominal pain.  Plan for CT. CT without obvious acute intra-abdominal finding.  Will discharge home.  PCP follow-up.    Emil Share, DO 01/23/24 1616

## 2024-01-23 NOTE — ED Notes (Signed)
 Pt refused leaving vitals.

## 2024-01-23 NOTE — ED Triage Notes (Addendum)
 Pt. Stated, Ive had RLQ pain for 2 days, its worse when I lay down. The pain is only when I move

## 2024-01-24 ENCOUNTER — Telehealth (HOSPITAL_COMMUNITY): Payer: Self-pay

## 2024-01-24 ENCOUNTER — Encounter (HOSPITAL_COMMUNITY): Admission: RE | Admit: 2024-01-24

## 2024-01-24 NOTE — Telephone Encounter (Signed)
 Patient c/o for 1:15 PR class due to stomach pain.

## 2024-01-27 NOTE — Progress Notes (Unsigned)
 Subjective:    Patient ID: Travis Hicks, male    DOB: [birth year], 73 y.o.   MRN: [id number]     HPI Bertis is here for follow up from the ED.   12/3 - presented with abdominal pain  He had RLQ pain x 2 days.  There was no radiation, but was worse with movement.  He denied N/V, dysuria.  Always has loose stools.  Abdomen was tender in RLQ.    Cbc, cmp, lipase, ua done.  CT abd/p wo acute finding or cause of pain.  Discharged to home.      Discussed the use of AI scribe software for clinical note transcription with the patient, who gave verbal consent to proceed.  History of Present Illness Travis Hicks is a 73 year old male with COPD who presents with persistent right-sided abdominal and back pain.  He has been experiencing right-sided abdominal and back pain that began a few days prior to his emergency room visit last week. Initially, the pain was localized to the right lower quadrant and worsened with movement. After the ER visit, the pain shifted to the right Hicks and back, becoming a constant soreness that intensifies with coughing, sneezing, or deep breathing, especially when lying down. He describes the pain as 'sore pretty much all the time' and notes that it is particularly painful when lying on his right Hicks, which is his usual sleeping position.  He has been attending rehabilitation for COPD for the past two and a half weeks. He denies any injury or activity that may have caused the pain.  In the emergency room, blood work, including kidney function, liver tests, and blood counts, were normal. A CT scan performed in the emergency room did not show any issues with the appendix or right kidney, and a couple of cysts were noted on the left kidney. Urinalysis performed in the emergency room did not show any infection.  No fever, chills, or changes in appetite, though he has had a long-standing issue with lack of taste and smell, affecting his drive to eat. He reports baseline  shortness of breath due to COPD but no new cough, wheezing, or chest pain. He mentions occasional heartburn and a history of loose stools but no recent blood in the stool. No headaches, lightheadedness, or urinary symptoms, though he now wakes up a couple of times at night to urinate, which is a change from his previous pattern.  He experiences cramping in both feet but maintains good hydration. No chronic back pain or recent back issues. The pain began suddenly, waking up with soreness in the lower right abdomen, which worsened when lying down. He has not taken any medication for the pain, as it is not severe enough to warrant it.     Medications and allergies reviewed with patient and updated if appropriate.  Current Outpatient Medications on File Prior to Visit  Medication Sig Dispense Refill   albuterol  (VENTOLIN  HFA) 108 (90 Base) MCG/ACT inhaler Inhale 2 puffs into the lungs every 6 (six) hours as needed for wheezing or shortness of breath. 8 g 11   b complex vitamins tablet Take 1 tablet by mouth daily. 100 tablet 3   Cholecalciferol (VITAMIN D3) 50 MCG (2000 UT) capsule Take 2 capsules (4,000 Units total) by mouth daily. 100 capsule 3   guaiFENesin  (MUCINEX ) 600 MG 12 hr tablet Take 1 tablet (600 mg total) by mouth 2 (two) times daily. 60 tablet 1   ipratropium-albuterol  (DUONEB)  0.5-2.5 (3) MG/3ML SOLN Take 3 mLs by nebulization every 4 (four) hours as needed. 360 mL 6   latanoprost  (XALATAN ) 0.005 % ophthalmic solution Place 1 drop into the left eye at bedtime.     Multiple Vitamin (MULTIVITAMIN WITH MINERALS) TABS tablet Take 1 tablet by mouth daily. 30 tablet 0   TRELEGY ELLIPTA  100-62.5-25 MCG/ACT AEPB TAKE 1 PUFF BY MOUTH EVERY DAY 60 each 5   No current facility-administered medications on file prior to visit.     Review of Systems  Constitutional:  Negative for chills and fever.  Respiratory:  Positive for shortness of breath (chronic  - at baseline). Negative for cough and  wheezing.   Cardiovascular:  Negative for chest pain, palpitations and leg swelling.  Gastrointestinal:  Negative for abdominal pain, blood in stool (no melena), constipation, diarrhea and nausea.       Occ gerd.  Chronic loose stools  Genitourinary:  Positive for enuresis. Negative for difficulty urinating, dysuria, frequency, hematuria and urgency.  Skin:        No itching  Neurological:  Negative for light-headedness, numbness and headaches.       Objective:   Vitals:   01/28/24 1401  BP: 130/66  Pulse: 68  Temp: 98 F (36.7 C)  SpO2: 95%   BP Readings from Last 3 Encounters:  01/28/24 130/66  01/23/24 114/87  12/26/23 (!) 140/70   Wt Readings from Last 3 Encounters:  01/28/24 164 lb (74.4 kg)  01/23/24 165 lb (74.8 kg)  01/15/24 166 lb 3.6 oz (75.4 kg)   Body mass index is 21.64 kg/m.    Physical Exam Constitutional:      General: He is not in acute distress.    Appearance: Normal appearance. He is not ill-appearing.     Comments: Using oxygen via nasal cannula  HENT:     Head: Normocephalic and atraumatic.  Cardiovascular:     Rate and Rhythm: Normal rate and regular rhythm.  Pulmonary:     Effort: Pulmonary effort is normal. No respiratory distress.     Breath sounds: No wheezing or rales.  Musculoskeletal:        General: Tenderness (Focal area of tenderness right mid back around lateral aspect of rib 11.  No other tenderness in right rib cage.  No thoracic or lumbar spine tenderness) present. No swelling or deformity.     Right lower leg: No edema.     Left lower leg: No edema.  Skin:    General: Skin is warm and dry.     Findings: No erythema or rash.  Neurological:     Mental Status: He is alert.        Lab Results  Component Value Date   WBC 6.5 01/23/2024   HGB 15.0 01/23/2024   HCT 45.8 01/23/2024   PLT 257 01/23/2024   GLUCOSE 132 (H) 01/23/2024   CHOL 188 12/18/2023   TRIG 116.0 12/18/2023   HDL 97.30 12/18/2023   LDLCALC 68  12/18/2023   ALT 22 01/23/2024   AST 27 01/23/2024   NA 141 01/23/2024   K 3.9 01/23/2024   CL 97 (L) 01/23/2024   CREATININE 0.98 01/23/2024   BUN 9 01/23/2024   CO2 33 (H) 01/23/2024   TSH 1.37 12/18/2023   PSA 0.57 12/18/2023   INR 0.9 09/26/2021   CT ABDOMEN PELVIS W CONTRAST CLINICAL DATA:  Right lower quadrant pain  EXAM: CT ABDOMEN AND PELVIS WITH CONTRAST  TECHNIQUE: Multidetector CT imaging of the  abdomen and pelvis was performed using the standard protocol following bolus administration of intravenous contrast.  RADIATION DOSE REDUCTION: This exam was performed according to the departmental dose-optimization program which includes automated exposure control, adjustment of the mA and/or kV according to patient size and/or use of iterative reconstruction technique.  CONTRAST:  75mL OMNIPAQUE  IOHEXOL  350 MG/ML SOLN  COMPARISON:  09/28/2021  FINDINGS: Lower chest: Similar mild cylindrical bibasilar bronchiectasis without airway obstruction or mucous plugging. Clear lung bases. Normal heart size. No pericardial or pleural effusion. No large hiatal hernia. Similar small fat containing posterior left diaphragmatic hernia.  Hepatobiliary: No focal liver abnormality is seen. No gallstones, gallbladder wall thickening, or biliary dilatation.  Pancreas: Unremarkable. No pancreatic ductal dilatation or surrounding inflammatory changes.  Spleen: Normal in size without focal abnormality.  Adrenals/Urinary Tract: Normal adrenal glands. Left kidney mid to lower pole cortical hypodense cyst measures 15 mm, previously 14 mm. No further imaging recommended. No right renal abnormality. No hydronephrosis or obstruction. Ureters are symmetric and decompressed. Bladder unremarkable.  Stomach/Bowel: Negative for bowel obstruction, significant dilatation ileus, or free air. Normal appendix demonstrated.  No free fluid, fluid collection, hemorrhage, hematoma, abscess  or ascites.  Vascular/Lymphatic: Aorta atherosclerotic. Negative for aneurysm or occlusive process. No dissection or acute aortic finding. Mesenteric and renal vasculature appear patent. No veno-occlusive process. No bulky adenopathy.  Reproductive: No significant finding by CT.  Other: Midline abdominal wall diastasis but no evidence of hernia. No abdominopelvic ascites.  Musculoskeletal: Degenerative changes throughout the spine. Lower lumbar facet arthropathy most pronounced at L4-5 associated minimal anterolisthesis. No pars defects. No acute osseous finding.  IMPRESSION: 1. No acute intra-abdominal or pelvic finding by CT. 2. Normal appendix. 3. Aortic atherosclerosis.  Aortic Atherosclerosis (ICD10-I70.0).  Electronically Signed   By: CHRISTELLA.  Shick M.D.   On: 01/23/2024 16:00    Assessment & Plan:    Assessment and Plan Assessment & Plan Musculoskeletal pain involving right lower abdomen and right lower rib.  Right lower abdominal pain resolved.  Pain now concentrated in the right mid back.  Focal tenderness with palpation around right lateral rib 10-11 CT abdomen pelvis without acute abnormality.  Blood work, urine unremarkable.  Possible nerve involvement from the lower back versus other musculoskeletal cause - Discussed x-ray of ribs/chest and thoracic spine, which he deferred. - Since pain is likely musculoskeletal we will monitor for now - Can take Tylenol  if needed - Use heat or cold for symptomatic relief. - Avoid exacerbating activities. - Follow up if symptoms persist or worsen.     I personally spent a total of 20 minutes in the care of the patient today including preparing to see the patient, getting/reviewing separately obtained history, performing a medically appropriate exam/evaluation, and documenting clinical information in the EHR.

## 2024-01-28 ENCOUNTER — Ambulatory Visit: Admitting: Internal Medicine

## 2024-01-28 ENCOUNTER — Encounter: Payer: Self-pay | Admitting: Internal Medicine

## 2024-01-28 VITALS — BP 130/66 | HR 68 | Temp 98.0°F | Ht 73.0 in | Wt 164.0 lb

## 2024-01-28 DIAGNOSIS — M546 Pain in thoracic spine: Secondary | ICD-10-CM

## 2024-01-28 DIAGNOSIS — R1031 Right lower quadrant pain: Secondary | ICD-10-CM | POA: Diagnosis not present

## 2024-01-28 NOTE — Patient Instructions (Addendum)
     Your pain is likely musculoskeletal in nature.      Medications changes include :   None.  Take tylenol  if needed.       Return if symptoms worsen or fail to improve.

## 2024-01-29 ENCOUNTER — Telehealth (HOSPITAL_COMMUNITY): Payer: Self-pay

## 2024-01-29 ENCOUNTER — Encounter (HOSPITAL_COMMUNITY): Admission: RE | Admit: 2024-01-29 | Discharge: 2024-01-29 | Attending: Internal Medicine

## 2024-01-29 VITALS — Wt 165.8 lb

## 2024-01-29 DIAGNOSIS — J449 Chronic obstructive pulmonary disease, unspecified: Secondary | ICD-10-CM

## 2024-01-29 NOTE — Telephone Encounter (Signed)
 Dr. Kara, Can we have an increase in Mr.  Lyster's target heart rate to 132 while exercising in Pulmonary rehab?  Thanks, [name] Sharps, RRT

## 2024-01-29 NOTE — Progress Notes (Signed)
 Daily Session Note  Patient Details  Name: Travis Hicks MRN: [id number] Date of Birth: January 14, 1951 Referring Provider:   Conrad Ports Pulmonary Rehab Walk Test from 12/26/2023 in [facility]  Referring Provider Plotnikov    Encounter Date: 01/29/2024  Check In:  Session Check In - 01/29/24 1330       Check-In   Supervising physician immediately available to respond to emergencies [facility] MD immediately available    Physician(s) Rosaline Skains, NP    Location [facility]    Staff Present [name], RN, BSN;Edith Groleau Claudene, RT;Randi [facility] BS, ACSM-CEP, Exercise Physiologist;Kaylee Nicholaus, MS, ACSM-CEP, Exercise Physiologist    Virtual Visit No    Medication changes reported     No    Fall or balance concerns reported    No    Tobacco Cessation No Change    Warm-up and Cool-down Performed as group-led instruction    Resistance Training Performed Yes    VAD Patient? No    PAD/SET Patient? No      Pain Assessment   Currently in Pain? No/denies    Multiple Pain Sites No          Capillary Blood Glucose: No results found for this or any previous visit (from the past 24 hours).   Exercise Prescription Changes - 01/29/24 1500       Response to Exercise   Blood Pressure (Admit) 128/66    Blood Pressure (Exercise) 130/80    Blood Pressure (Exit) 118/64    Heart Rate (Admit) 109 bpm    Heart Rate (Exercise) 119 bpm    Heart Rate (Exit) 73 bpm    Oxygen Saturation (Admit) 94 %    Oxygen Saturation (Exercise) 98 %    Oxygen Saturation (Exit) 95 %    Rating of Perceived Exertion (Exercise) 15    Perceived Dyspnea (Exercise) 3    Duration Continue with 30 min of aerobic exercise without signs/symptoms of physical distress.    Intensity THRR unchanged      Progression   Progression Continue to progress workloads to maintain intensity without signs/symptoms of physical distress.      Resistance Training    Training Prescription Yes    Weight red bands    Reps 10-15    Time 10 Minutes      Oxygen   Oxygen Continuous    Liters 2      Recumbant Bike   Level 2    RPM 48    Minutes 15    METs 2.2      NuStep   Level 1    SPM 81    Minutes 15    METs 2.1      Oxygen   Maintain Oxygen Saturation 88% or higher          Social History   Tobacco Use  Smoking Status Former   Current packs/day: 0.00   Types: Cigarettes   Quit date: 08/21/2023   Years since quitting: 0.4  Smokeless Tobacco Never  Tobacco Comments   Quit smoking July 2025    Goals Met:  Proper associated with RPD/PD & O2 Sat Independence with exercise equipment Exercise tolerated well No report of concerns or symptoms today Strength training completed today  Goals Unmet:  Not Applicable  Comments: Service time is from 1310 to 1444.    Dr. Slater Staff is Medical Director for Pulmonary Rehab at [facility].

## 2024-01-30 NOTE — Telephone Encounter (Signed)
Yes, that is ok.  Thanks, JD 

## 2024-01-31 ENCOUNTER — Encounter (HOSPITAL_COMMUNITY): Admission: RE | Admit: 2024-01-31 | Discharge: 2024-01-31 | Attending: Internal Medicine

## 2024-01-31 DIAGNOSIS — J449 Chronic obstructive pulmonary disease, unspecified: Secondary | ICD-10-CM | POA: Diagnosis not present

## 2024-01-31 NOTE — Progress Notes (Signed)
 Daily Session Note  Patient Details  Name: Travis Hicks MRN: [id number] Date of Birth: [birth year] Referring Provider:   Conrad Ports Pulmonary Rehab Walk Test from 12/26/2023 in [facility]  Referring Provider Plotnikov    Encounter Date: 01/31/2024  Check In:  Session Check In - 01/31/24 1326       Check-In   Supervising physician immediately available to respond to emergencies [facility] MD immediately available    Physician(s) Lum Louis, NP    Location [facility]    Staff Present [name], RN, BSN;Jahson Emanuele Claudene, RT;Randi [place] BS, ACSM-CEP, Exercise Physiologist;Kaylee [place], MS, ACSM-CEP, Exercise Physiologist    Virtual Visit No    Medication changes reported     No    Fall or balance concerns reported    No    Tobacco Cessation No Change    Warm-up and Cool-down Performed as group-led instruction    Resistance Training Performed Yes    VAD Patient? No    PAD/SET Patient? No      Pain Assessment   Currently in Pain? No/denies    Pain Score 0-No pain    Multiple Pain Sites No          Capillary Blood Glucose: No results found for this or any previous visit (from the past 24 hours).    Tobacco Use History[1]  Goals Met:  Proper associated with RPD/PD & O2 Sat Independence with exercise equipment Exercise tolerated well No report of concerns or symptoms today Strength training completed today  Goals Unmet:  Not Applicable  Comments: Service time is from 1311 to 1446.    Dr. Slater Staff is Medical Director for Pulmonary Rehab at [facility].     [1]  Social History Tobacco Use  Smoking Status Former   Current packs/day: 0.00   Average packs/day: 1.0 packs/day   Types: Cigarettes   Quit date: 08/21/2023   Years since quitting: 0.4  Smokeless Tobacco Never  Tobacco Comments   Quit smoking July 2025

## 2024-02-01 ENCOUNTER — Other Ambulatory Visit: Payer: Self-pay | Admitting: Pulmonary Disease

## 2024-02-01 DIAGNOSIS — J432 Centrilobular emphysema: Secondary | ICD-10-CM

## 2024-02-05 ENCOUNTER — Encounter (HOSPITAL_COMMUNITY): Admission: RE | Admit: 2024-02-05 | Discharge: 2024-02-05 | Attending: Internal Medicine

## 2024-02-05 DIAGNOSIS — J449 Chronic obstructive pulmonary disease, unspecified: Secondary | ICD-10-CM | POA: Diagnosis not present

## 2024-02-05 NOTE — Progress Notes (Signed)
 Daily Session Note  Patient Details  Name: Travis Hicks MRN: [id number] Date of Birth: Aug 21, 1950 Referring Provider:   Conrad Ports Pulmonary Rehab Walk Test from 12/26/2023 in [facility]  Referring Provider Plotnikov    Encounter Date: 02/05/2024  Check In:  Session Check In - 02/05/24 1345       Check-In   Supervising physician immediately available to respond to emergencies [facility] MD immediately available    Physician(s) Orren Fabry, PA    Location [facility]    Staff Present [name], RN, BSN;Casey Claudene, Neita Moats, MS, ACSM-CEP, Exercise Physiologist;Carlette Bernett, RN, BSN    Virtual Visit No    Medication changes reported     No    Fall or balance concerns reported    No    Tobacco Cessation No Change    Warm-up and Cool-down Performed as group-led instruction    Resistance Training Performed Yes    VAD Patient? No    PAD/SET Patient? No      Pain Assessment   Currently in Pain? No/denies    Pain Score 0-No pain    Multiple Pain Sites No          Capillary Blood Glucose: No results found for this or any previous visit (from the past 24 hours).    Tobacco Use History[1]  Goals Met:  Exercise tolerated well Queuing for purse lip breathing No report of concerns or symptoms today Strength training completed today  Goals Unmet:  Not Applicable  Comments: Service time is from 1311 to 1435    Dr. Slater Staff is Medical Director for Pulmonary Rehab at [facility].     [1]  Social History Tobacco Use  Smoking Status Former   Current packs/day: 0.00   Average packs/day: 1.0 packs/day   Types: Cigarettes   Quit date: 08/21/2023   Years since quitting: 0.4  Smokeless Tobacco Never  Tobacco Comments   Quit smoking July 2025

## 2024-02-07 ENCOUNTER — Encounter (HOSPITAL_COMMUNITY): Admission: RE | Admit: 2024-02-07 | Discharge: 2024-02-07 | Attending: Internal Medicine

## 2024-02-07 DIAGNOSIS — J449 Chronic obstructive pulmonary disease, unspecified: Secondary | ICD-10-CM

## 2024-02-07 NOTE — Progress Notes (Signed)
 Daily Session Note  Patient Details  Name: Travis Hicks MRN: [id number] Date of Birth: Apr 08, 1950 Referring Provider:   Conrad Ports Pulmonary Rehab Walk Test from 12/26/2023 in [facility]  Referring Provider Plotnikov    Encounter Date: 02/07/2024  Check In:  Session Check In - 02/07/24 1319       Check-In   Supervising physician immediately available to respond to emergencies [facility] immediately available    Physician(s) Barnie Hila, NP    [facility]    Staff Present [name], RN, BSN;Casey Claudene, Neita Moats, MS, ACSM-CEP, Exercise Physiologist;Randi Midge HECKLE, ACSM-CEP, Exercise Physiologist    Virtual Visit No    Medication changes reported     No    Fall or balance concerns reported    No    Tobacco Cessation No Change    Warm-up and Cool-down Performed as group-led instruction    Resistance Training Performed Yes    VAD Patient? No    PAD/SET Patient? No      Pain Assessment   Currently in Pain? No/denies    Pain Score 0-No pain    Multiple Pain Sites No          Capillary Blood Glucose: No results found for this or any previous visit (from the past 24 hours).    Tobacco Use History[1]  Goals Met:  Independence with exercise equipment Exercise tolerated well Queuing for purse lip breathing No report of concerns or symptoms today Strength training completed today  Goals Unmet:  Not Applicable  Comments: Service time is from 1309 to 1449    Dr. Slater Staff is Medical Director for Pulmonary Rehab at [facility].     [1]  Social History Tobacco Use  Smoking Status Former   Current packs/day: 0.00   Average packs/day: 1.0 packs/day   Types: Cigarettes   Quit date: 08/21/2023   Years since quitting: 0.4  Smokeless Tobacco Never  Tobacco Comments   Quit smoking July 2025

## 2024-02-12 ENCOUNTER — Encounter (HOSPITAL_COMMUNITY)
Admission: RE | Admit: 2024-02-12 | Discharge: 2024-02-12 | Disposition: A | Discharge status: Home / Self Care | Source: Ambulatory Visit | Attending: Internal Medicine | Admitting: Internal Medicine

## 2024-02-12 VITALS — Wt 162.7 lb

## 2024-02-12 DIAGNOSIS — J449 Chronic obstructive pulmonary disease, unspecified: Secondary | ICD-10-CM

## 2024-02-12 NOTE — Progress Notes (Signed)
 Daily Session Note  Patient Details  Name: Travis Hicks MRN: [id number] Date of Birth: 12-08-1950 Referring Provider:   Conrad Ports Pulmonary Rehab Walk Test from 12/26/2023 in [facility]  Referring Provider Plotnikov    Encounter Date: 02/12/2024  Check In:  Session Check In - 02/12/24 1518       Check-In   Supervising physician immediately available to respond to emergencies [facility] immediately available    Physician(s) Barnie Hila, NP    Location [facility]    Staff Present [name], RN, BSN;Casey Claudene, RT;Brookelin Felber BS, ACSM-CEP, Exercise Physiologist    Virtual Visit No    Medication changes reported     No    Fall or balance concerns reported    No    Tobacco Cessation No Change    Warm-up and Cool-down Performed as group-led instruction    Resistance Training Performed Yes    VAD Patient? No    PAD/SET Patient? No      Pain Assessment   Currently in Pain? No/denies          Capillary Blood Glucose: No results found for this or any previous visit (from the past 24 hours).   Exercise Prescription Changes - 02/12/24 1500       Response to Exercise   Blood Pressure (Admit) 130/80    Blood Pressure (Exercise) 160/80    Blood Pressure (Exit) 110/72    Heart Rate (Admit) 104 bpm    Heart Rate (Exercise) 108 bpm    Heart Rate (Exit) 54 bpm   known PVCs   Oxygen Saturation (Admit) 94 %    Oxygen Saturation (Exercise) 94 %    Oxygen Saturation (Exit) 94 %    Rating of Perceived Exertion (Exercise) 15    Perceived Dyspnea (Exercise) 3    Duration Continue with 30 min of aerobic exercise without signs/symptoms of physical distress.    Intensity THRR New      Progression   Progression Continue to progress workloads to maintain intensity without signs/symptoms of physical distress.      Resistance Training   Weight red bands    Reps 10-15    Time 10 Minutes      Oxygen    Oxygen Continuous    Liters 2      Recumbant Bike   Level 2    RPM 51    Minutes 15    METs 2.2      NuStep   Level 1    SPM 83    Minutes 15    METs 2      Oxygen   Maintain Oxygen Saturation 88% or higher          Tobacco Use History[1]  Goals Met:  Exercise tolerated well No report of concerns or symptoms today Strength training completed today  Goals Unmet:  Not Applicable  Comments: Service time is from 1315 to 1440.    Dr. Slater Staff is Medical Director for Pulmonary Rehab at [facility].     [1]  Social History Tobacco Use  Smoking Status Former   Current packs/day: 0.00   Average packs/day: 1.0 packs/day   Types: Cigarettes   Quit date: 08/21/2023   Years since quitting: 0.4  Smokeless Tobacco Never  Tobacco Comments   Quit smoking July 2025

## 2024-02-13 NOTE — Progress Notes (Signed)
 Pulmonary Individual Treatment Plan  Patient Details  Name: Travis Hicks MRN: [id number] Date of Birth: 1950/10/24 Referring Provider:   Conrad Ports Pulmonary Rehab Walk Test from 12/26/2023 in [facility]  Referring Provider Plotnikov    Initial Encounter Date:  Flowsheet Row Pulmonary Rehab Walk Test from 12/26/2023 in [facility]  Date 12/26/23    Visit Diagnosis: Stage 3 severe COPD by GOLD classification (HCC)  Patient's Home Medications on Admission:  Current Medications[1]  Past Medical History: Past Medical History:  Diagnosis Date   Cataract    veru small per pt .   Emphysema of lung (HCC)    Glaucoma 2018   Overactive bladder     Tobacco Use: Tobacco Use History[2]  Labs: Review Flowsheet  More data exists      Latest Ref Rng & Units 01/14/2019 03/05/2020 09/26/2021 12/14/2022 12/18/2023  Labs for ITP Cardiac and Pulmonary Rehab  Cholestrol 0 - 200 mg/dL 803  859  - 796  811   LDL (calc) 0 - 99 mg/dL 60  43  - 897  68   HDL-C >39.00 mg/dL 878.29  73  - 23.89  02.69   Trlycerides 0.0 - 149.0 mg/dL 28.9  833  - 875.9  883.9   Bicarbonate 20.0 - 28.0 mmol/L - - 32.6  - -  O2 Saturation % - - 86.6  - -    Capillary Blood Glucose: No results found for: GLUCAP   Pulmonary Assessment Scores:  Pulmonary Assessment Scores     Row Name 12/26/23 1432         ADL UCSD   ADL Phase Entry     SOB Score total 91       CAT Score   CAT Score 22       mMRC Score   mMRC Score 4       UCSD: Self-administered rating of dyspnea associated with activities of daily living (ADLs) 6-point scale (0 = not at all to 5 = maximal or unable to do because of breathlessness)  Scoring Scores range from 0 to 120.  Minimally important difference is 5 units  CAT: CAT can identify the health impairment of COPD patients and is better correlated with  disease progression.  CAT has a scoring range of zero to 40. The CAT score is classified into four groups of low (less than 10), medium (10 - 20), high (21-30) and very high (31-40) based on the impact level of disease on health status. A CAT score over 10 suggests significant symptoms.  A worsening CAT score could be explained by an exacerbation, poor medication adherence, poor inhaler technique, or progression of COPD or comorbid conditions.  CAT MCID is 2 points  mMRC: mMRC (Modified Medical Research Council) Dyspnea Scale is used to assess the degree of baseline functional disability in patients of respiratory disease due to dyspnea. No minimal important difference is established. A decrease in score of 1 point or greater is considered a positive change.   Pulmonary Function Assessment:  Pulmonary Function Assessment - 12/26/23 1341       Breath   Bilateral Breath Sounds Clear;Decreased    Shortness of Breath Yes;Limiting activity;Fear of Shortness of Breath;Panic with Shortness of Breath          Exercise Target Goals: Exercise Program Goal: Individual exercise prescription set using results from initial 6 min walk test and THRR while  considering  patients activity barriers and safety.   Exercise Prescription Goal: Initial exercise prescription builds to 30-45 minutes a day of aerobic activity, 2-3 days per week.  Home exercise guidelines will be given to patient during program as part of exercise prescription that the participant will acknowledge.  Activity Barriers & Risk Stratification:  Activity Barriers & Cardiac Risk Stratification - 12/26/23 1339       Activity Barriers & Cardiac Risk Stratification   Activity Barriers Deconditioning;Muscular Weakness;Shortness of Breath    Cardiac Risk Stratification Moderate          6 Minute Walk:  6 Minute Walk     Row Name 12/26/23 1427         6 Minute Walk   Phase Initial     Distance 630 feet     Walk Time 6  minutes     # of Rest Breaks 4  1:49-2:12; 2:46-3:10; 4:11-4:25; 5:12-5:44     MPH 1.19     METS 2.59     RPE 13     Perceived Dyspnea  4     VO2 Peak 9.06     Resting HR 90 bpm     Resting BP 140/70     Resting Oxygen Saturation  95 %     Exercise Oxygen Saturation  during 6 min walk 93 %     Max Ex. HR 132 bpm     Max Ex. BP 152/80     2 Minute Post BP 150/80       Interval HR   1 Minute HR 132     2 Minute HR 109     3 Minute HR 132     4 Minute HR 132     5 Minute HR 131     6 Minute HR 118     2 Minute Post HR 110     Interval Heart Rate? Yes       Interval Oxygen   Interval Oxygen? Yes     Baseline Oxygen Saturation % 95 %     1 Minute Oxygen Saturation % 96 %     1 Minute Liters of Oxygen 2 L     2 Minute Oxygen Saturation % 94 %     2 Minute Liters of Oxygen 2 L     3 Minute Oxygen Saturation % 94 %     3 Minute Liters of Oxygen 2 L     4 Minute Oxygen Saturation % 94 %     4 Minute Liters of Oxygen 2 L     5 Minute Oxygen Saturation % 93 %     5 Minute Liters of Oxygen 2 L     6 Minute Oxygen Saturation % 93 %     6 Minute Liters of Oxygen 2 L     2 Minute Post Oxygen Saturation % 95 %     2 Minute Post Liters of Oxygen 2 L        Oxygen Initial Assessment:  Oxygen Initial Assessment - 12/26/23 1339       Home Oxygen   Home Oxygen Device Home Concentrator;Portable Concentrator    Sleep Oxygen Prescription Continuous    Liters per minute 2    Home Exercise Oxygen Prescription Continuous    Liters per minute 2    Home Resting Oxygen Prescription Continuous    Liters per minute 2    Compliance with Home Oxygen Use Yes  Initial 6 min Walk   Oxygen Used Continuous    Liters per minute 2      Program Oxygen Prescription   Program Oxygen Prescription Continuous    Liters per minute 2      Intervention   Short Term Goals To learn and exhibit compliance with exercise, home and travel O2 prescription;To learn and understand importance of  maintaining oxygen saturations>88%;To learn and demonstrate proper use of respiratory medications;To learn and understand importance of monitoring SPO2 with pulse oximeter and demonstrate accurate use of the pulse oximeter.;To learn and demonstrate proper pursed lip breathing techniques or other breathing techniques.     Long  Term Goals Exhibits compliance with exercise, home  and travel O2 prescription;Maintenance of O2 saturations>88%;Compliance with respiratory medication;Verbalizes importance of monitoring SPO2 with pulse oximeter and return demonstration;Exhibits proper breathing techniques, such as pursed lip breathing or other method taught during program session;Demonstrates proper use of MDIs          Oxygen Re-Evaluation:  Oxygen Re-Evaluation     Row Name 01/07/24 1337 02/08/24 1359           Program Oxygen Prescription   Program Oxygen Prescription Continuous Continuous      Liters per minute 2 2        Home Oxygen   Home Oxygen Device Home Concentrator;Portable Concentrator Home Concentrator;Portable Concentrator      Sleep Oxygen Prescription Continuous Continuous      Liters per minute 2 2      Home Exercise Oxygen Prescription Continuous Continuous      Liters per minute 2 2      Home Resting Oxygen Prescription Continuous Continuous      Liters per minute 2 2      Compliance with Home Oxygen Use Yes Yes        Goals/Expected Outcomes   Short Term Goals To learn and exhibit compliance with exercise, home and travel O2 prescription;To learn and understand importance of maintaining oxygen saturations>88%;To learn and demonstrate proper use of respiratory medications;To learn and understand importance of monitoring SPO2 with pulse oximeter and demonstrate accurate use of the pulse oximeter.;To learn and demonstrate proper pursed lip breathing techniques or other breathing techniques.  To learn and exhibit compliance with exercise, home and travel O2 prescription;To learn  and understand importance of maintaining oxygen saturations>88%;To learn and demonstrate proper use of respiratory medications;To learn and understand importance of monitoring SPO2 with pulse oximeter and demonstrate accurate use of the pulse oximeter.;To learn and demonstrate proper pursed lip breathing techniques or other breathing techniques.       Long  Term Goals Exhibits compliance with exercise, home  and travel O2 prescription;Maintenance of O2 saturations>88%;Compliance with respiratory medication;Verbalizes importance of monitoring SPO2 with pulse oximeter and return demonstration;Exhibits proper breathing techniques, such as pursed lip breathing or other method taught during program session;Demonstrates proper use of MDIs Exhibits compliance with exercise, home  and travel O2 prescription;Maintenance of O2 saturations>88%;Compliance with respiratory medication;Verbalizes importance of monitoring SPO2 with pulse oximeter and return demonstration;Exhibits proper breathing techniques, such as pursed lip breathing or other method taught during program session;Demonstrates proper use of MDIs      Goals/Expected Outcomes Compliance and understanding of oxygen saturation monitoring and breathing techniques to decrease shortness of breath. Compliance and understanding of oxygen saturation monitoring and breathing techniques to decrease shortness of breath.         Oxygen Discharge (Final Oxygen Re-Evaluation):  Oxygen Re-Evaluation - 02/08/24 1359  Program Oxygen Prescription   Program Oxygen Prescription Continuous    Liters per minute 2      Home Oxygen   Home Oxygen Device Home Concentrator;Portable Concentrator    Sleep Oxygen Prescription Continuous    Liters per minute 2    Home Exercise Oxygen Prescription Continuous    Liters per minute 2    Home Resting Oxygen Prescription Continuous    Liters per minute 2    Compliance with Home Oxygen Use Yes      Goals/Expected Outcomes    Short Term Goals To learn and exhibit compliance with exercise, home and travel O2 prescription;To learn and understand importance of maintaining oxygen saturations>88%;To learn and demonstrate proper use of respiratory medications;To learn and understand importance of monitoring SPO2 with pulse oximeter and demonstrate accurate use of the pulse oximeter.;To learn and demonstrate proper pursed lip breathing techniques or other breathing techniques.     Long  Term Goals Exhibits compliance with exercise, home  and travel O2 prescription;Maintenance of O2 saturations>88%;Compliance with respiratory medication;Verbalizes importance of monitoring SPO2 with pulse oximeter and return demonstration;Exhibits proper breathing techniques, such as pursed lip breathing or other method taught during program session;Demonstrates proper use of MDIs    Goals/Expected Outcomes Compliance and understanding of oxygen saturation monitoring and breathing techniques to decrease shortness of breath.          Initial Exercise Prescription:  Initial Exercise Prescription - 12/26/23 1400       Date of Initial Exercise RX and Referring Provider   Date 12/26/23    Referring Provider Plotnikov    Expected Discharge Date 04/01/24      Oxygen   Oxygen Continuous    Liters 2    Maintain Oxygen Saturation 88% or higher      Recumbant Bike   Level 1    RPM 56    Minutes 15    METs 1.8      NuStep   Level 1    SPM 67    Minutes 15    METs 1.9      Prescription Details   Frequency (times per week) 2    Duration Progress to 30 minutes of continuous aerobic without signs/symptoms of physical distress      Intensity   THRR 40-80% of Max Heartrate 59-118    Ratings of Perceived Exertion 11-13    Perceived Dyspnea 0-4      Progression   Progression Continue to progress workloads to maintain intensity without signs/symptoms of physical distress.      Resistance Training   Training Prescription No           Perform Capillary Blood Glucose checks as needed.  Exercise Prescription Changes:   Exercise Prescription Changes     Row Name 01/01/24 1500 01/15/24 1500 01/29/24 1500 02/12/24 1500       Response to Exercise   Blood Pressure (Admit) 130/80 119/84 128/66 130/80    Blood Pressure (Exercise) 140/80 138/90 130/80 160/80    Blood Pressure (Exit) 120/70 124/80 118/64 110/72    Heart Rate (Admit) 81 bpm 94 bpm 109 bpm 104 bpm    Heart Rate (Exercise) 98 bpm 117 bpm 119 bpm 108 bpm    Heart Rate (Exit) 87 bpm 104 bpm 73 bpm 54 bpm  known PVCs    Oxygen Saturation (Admit) 93 % 96 % 94 % 94 %    Oxygen Saturation (Exercise) 96 % 93 % 98 % 94 %    Oxygen Saturation (  Exit) 96 % 94 % 95 % 94 %    Rating of Perceived Exertion (Exercise) 10 17 15 15     Perceived Dyspnea (Exercise) 3 3 3 3     Duration Continue with 30 min of aerobic exercise without signs/symptoms of physical distress. Continue with 30 min of aerobic exercise without signs/symptoms of physical distress. Continue with 30 min of aerobic exercise without signs/symptoms of physical distress. Continue with 30 min of aerobic exercise without signs/symptoms of physical distress.    Intensity THRR unchanged THRR unchanged THRR unchanged THRR New      Progression   Progression Continue to progress workloads to maintain intensity without signs/symptoms of physical distress. Continue to progress workloads to maintain intensity without signs/symptoms of physical distress. Continue to progress workloads to maintain intensity without signs/symptoms of physical distress. Continue to progress workloads to maintain intensity without signs/symptoms of physical distress.      Resistance Training   Training Prescription -- Yes Yes --    Weight -- red bands red bands red bands    Reps -- 10-15 10-15 10-15    Time -- 10 Minutes 10 Minutes 10 Minutes      Oxygen   Oxygen Continuous Continuous Continuous Continuous    Liters 2 2 2 2        Recumbant Bike   Level -- 1 2 2     RPM -- -- 48 51    Minutes -- 15 15 15     METs -- 2.2 2.2 2.2      NuStep   Level 1 1 1 1     SPM 66 -- 81 83    Minutes 15 15 15 15     METs 1.8 2.2 2.1 2      Oxygen   Maintain Oxygen Saturation 88% or higher 88% or higher 88% or higher 88% or higher       Exercise Comments:   Exercise Comments     Row Name 01/01/24 1536           Exercise Comments Pt completed first day of group exercise. He exercised on the recumbent stepper for 30 min, level 1, METs 1.8. He needed a rest break as well as sitting rests during warm up and cool down. He used blue bands. Will progress as tolerated.          Exercise Goals and Review:   Exercise Goals     Row Name 12/26/23 1316             Exercise Goals   Increase Physical Activity Yes       Intervention Provide advice, education, support and counseling about physical activity/exercise needs.;Develop an individualized exercise prescription for aerobic and resistive training based on initial evaluation findings, risk stratification, comorbidities and participant's personal goals.       Expected Outcomes Short Term: Attend rehab on a regular basis to increase amount of physical activity.;Long Term: Exercising regularly at least 3-5 days a week.;Long Term: Add in home exercise to make exercise part of routine and to increase amount of physical activity.       Increase Strength and Stamina Yes       Intervention Provide advice, education, support and counseling about physical activity/exercise needs.;Develop an individualized exercise prescription for aerobic and resistive training based on initial evaluation findings, risk stratification, comorbidities and participant's personal goals.       Expected Outcomes Short Term: Perform resistance training exercises routinely during rehab and add in resistance training at home;Long Term: Improve  cardiorespiratory fitness, muscular endurance and strength as measured  by increased METs and functional capacity ( );Short Term: Increase workloads from initial exercise prescription for resistance, speed, and METs.       Able to understand and use rate of perceived exertion (RPE) scale Yes       Intervention Provide education and explanation on how to use RPE scale       Expected Outcomes Short Term: Able to use RPE daily in rehab to express subjective intensity level;Long Term:  Able to use RPE to guide intensity level when exercising independently       Able to understand and use Dyspnea scale Yes       Intervention Provide education and explanation on how to use Dyspnea scale       Expected Outcomes Short Term: Able to use Dyspnea scale daily in rehab to express subjective sense of shortness of breath during exertion;Long Term: Able to use Dyspnea scale to guide intensity level when exercising independently       Knowledge and understanding of Target Heart Rate Range (THRR) Yes       Intervention Provide education and explanation of THRR including how the numbers were predicted and where they are located for reference       Expected Outcomes Short Term: Able to state/look up THRR;Long Term: Able to use THRR to govern intensity when exercising independently;Short Term: Able to use daily as guideline for intensity in rehab       Understanding of Exercise Prescription Yes       Intervention Provide education, explanation, and written materials on patient's individual exercise prescription       Expected Outcomes Short Term: Able to explain program exercise prescription;Long Term: Able to explain home exercise prescription to exercise independently          Exercise Goals Re-Evaluation :  Exercise Goals Re-Evaluation     Row Name 01/07/24 1335 02/08/24 1358           Exercise Goal Re-Evaluation   Exercise Goals Review Increase Physical Activity;Able to understand and use Dyspnea scale;Understanding of Exercise Prescription;Increase Strength and  Stamina;Knowledge and understanding of Target Heart Rate Range (THRR);Able to understand and use rate of perceived exertion (RPE) scale Increase Physical Activity;Able to understand and use Dyspnea scale;Understanding of Exercise Prescription;Increase Strength and Stamina;Knowledge and understanding of Target Heart Rate Range (THRR);Able to understand and use rate of perceived exertion (RPE) scale      Comments Pt has completed 2 exercise sessions. He is exercising on the recumbent stepper for 15 min, level 1, METs 1.8. He then is exercising on the recumbent bike for 15 min, level 1, METs 1.8. It is too early to discern progression however pt had to sit less for warm up and cool down his past session compared to the first. Using red bands for resistance training, 3.7 lbs. Will progress as tolerated. Pt has completed 10 exercise sessions. He is exercising on the recumbent stepper for 15 min, level 1, METs 1.9. He then is exercising on the recumbent bike for 15 min, level 2, METs 2.2. He is very slowly progressing, he is quite deconditioned. Using red bands for resistance training, 3.7 lbs, supported squats. Will progress as tolerated.      Expected Outcomes Through exercise at rehab and home, the patient will decrease shortness of breath with daily activities and feel confident in carrying out an exercise regimen at home. Through exercise at rehab and home, the patient will decrease shortness of breath  with daily activities and feel confident in carrying out an exercise regimen at home.         Discharge Exercise Prescription (Final Exercise Prescription Changes):  Exercise Prescription Changes - 02/12/24 1500       Response to Exercise   Blood Pressure (Admit) 130/80    Blood Pressure (Exercise) 160/80    Blood Pressure (Exit) 110/72    Heart Rate (Admit) 104 bpm    Heart Rate (Exercise) 108 bpm    Heart Rate (Exit) 54 bpm   known PVCs   Oxygen Saturation (Admit) 94 %    Oxygen Saturation  (Exercise) 94 %    Oxygen Saturation (Exit) 94 %    Rating of Perceived Exertion (Exercise) 15    Perceived Dyspnea (Exercise) 3    Duration Continue with 30 min of aerobic exercise without signs/symptoms of physical distress.    Intensity THRR New      Progression   Progression Continue to progress workloads to maintain intensity without signs/symptoms of physical distress.      Resistance Training   Weight red bands    Reps 10-15    Time 10 Minutes      Oxygen   Oxygen Continuous    Liters 2      Recumbant Bike   Level 2    RPM 51    Minutes 15    METs 2.2      NuStep   Level 1    SPM 83    Minutes 15    METs 2      Oxygen   Maintain Oxygen Saturation 88% or higher          Nutrition:  Target Goals: Understanding of nutrition guidelines, daily intake of sodium 1500mg , cholesterol 200mg , calories 30% from fat and 7% or less from saturated fats, daily to have 5 or more servings of fruits and vegetables.  Biometrics:  Pre Biometrics - 12/26/23 1438       Pre Biometrics   Grip Strength 34 kg           Nutrition Therapy Plan and Nutrition Goals:  Nutrition Therapy & Goals - 01/01/24 1437       Nutrition Therapy   Diet Regular diet      Personal Nutrition Goals   Nutrition Goal Patient to identify strategies for weight gain of 0.5-2 # per week.    Personal Goal #2 Patient to include rich source of protein with each meal/snack.    Comments Patient with history of stage 3 COPD, malnutrition. Reports difficulty maintaining weight due to lack of appetite and early satiety. Reports loss of taste/smell over past 8-10 years which complicates intake. Currently underweight based on recommended BMI of 23-27 kg/m2 for >= 65 years. Pt verbalizes motivation to increase frequency of small meals including sources of protein such as dairy, chicken, and fish.      Intervention Plan   Intervention Prescribe, educate and counsel regarding individualized specific dietary  modifications aiming towards targeted core components such as weight, hypertension, lipid management, diabetes, heart failure and other comorbidities.;Nutrition handout(s) given to patient.   Handouts: Lean Protein for COPD, Weight Gain and Weight Maintenance   Expected Outcomes Short Term Goal: Understand basic principles of dietary content, such as calories, fat, sodium, cholesterol and nutrients.;Long Term Goal: Adherence to prescribed nutrition plan.          Nutrition Assessments:  MEDIFICTS Score Key: >=70 Need to make dietary changes  40-70 Heart Healthy Diet <= 40  Therapeutic Level Cholesterol Diet   Picture Your Plate Scores: <59 Unhealthy dietary pattern with much room for improvement. 41-50 Dietary pattern unlikely to meet recommendations for good health and room for improvement. 51-60 More healthful dietary pattern, with some room for improvement.  >60 Healthy dietary pattern, although there may be some specific behaviors that could be improved.    Nutrition Goals Re-Evaluation:  Nutrition Goals Re-Evaluation     Row Name 01/22/24 1339             Goals   Current Weight 165 lb 9.6 oz (75.1 kg)       Nutrition Goal Patient to identify strategies for weight gain of 0.5-2 # per week.       Expected Outcome Goals in action. Patient with history of stage 3 COPD, malnutrition. Reports difficulty maintaining weight due to lack of appetite and early satiety. Reports loss of taste/smell over past 8-10 years which complicates intake. Remains underweight based on recommended BMI of 23-27 kg/m2 for >= 65 years. However, weight gain of 2.2 lb (1.3%) over past month noted. Patient currently consuming 3 small meals/snacks daily which include high protein foods such as chicken, fish, corned beef, hot dog. Does not prefer oral nutrition supplements such as Ensure due to sweet taste. RD praised pt for including good sources of protein with meals/snacks. Discussed adding two additional  snacks/small meals such as peanut butter and crackers to help promote healthy weight gain.         Personal Goal #2 Re-Evaluation   Personal Goal #2 Patient to include rich source of protein with each meal/snack.          Nutrition Goals Discharge (Final Nutrition Goals Re-Evaluation):  Nutrition Goals Re-Evaluation - 01/22/24 1339       Goals   Current Weight 165 lb 9.6 oz (75.1 kg)    Nutrition Goal Patient to identify strategies for weight gain of 0.5-2 # per week.    Expected Outcome Goals in action. Patient with history of stage 3 COPD, malnutrition. Reports difficulty maintaining weight due to lack of appetite and early satiety. Reports loss of taste/smell over past 8-10 years which complicates intake. Remains underweight based on recommended BMI of 23-27 kg/m2 for >= 65 years. However, weight gain of 2.2 lb (1.3%) over past month noted. Patient currently consuming 3 small meals/snacks daily which include high protein foods such as chicken, fish, corned beef, hot dog. Does not prefer oral nutrition supplements such as Ensure due to sweet taste. RD praised pt for including good sources of protein with meals/snacks. Discussed adding two additional snacks/small meals such as peanut butter and crackers to help promote healthy weight gain.      Personal Goal #2 Re-Evaluation   Personal Goal #2 Patient to include rich source of protein with each meal/snack.          Psychosocial: Target Goals: Acknowledge presence or absence of significant depression and/or stress, maximize coping skills, provide positive support system. Participant is able to verbalize types and ability to use techniques and skills needed for reducing stress and depression.  Initial Review & Psychosocial Screening:  Initial Psych Review & Screening - 12/26/23 1337       Initial Review   Current issues with None Identified      Family Dynamics   Good Support System? Yes    Comments Support from his family       Barriers   Psychosocial barriers to participate in program There are no identifiable barriers  or psychosocial needs.      Screening Interventions   Interventions Encouraged to exercise    Expected Outcomes Short Term goal: Identification and review with participant of any Quality of Life or Depression concerns found by scoring the questionnaire.;Long Term goal: The participant improves quality of Life and PHQ9 Scores as seen by post scores and/or verbalization of changes          Quality of Life Scores:  Scores of 19 and below usually indicate a poorer quality of life in these areas.  A difference of  2-3 points is a clinically meaningful difference.  A difference of 2-3 points in the total score of the Quality of Life Index has been associated with significant improvement in overall quality of life, self-image, physical symptoms, and general health in studies assessing change in quality of life.  PHQ-9: Review Flowsheet  More data exists      01/28/2024 12/26/2023 12/14/2022 11/30/2022 01/09/2022  Depression screen PHQ 2/9  Decreased Interest 1 2 2 3  0 0  Down, Depressed, Hopeless 0 0 0 0 0 0  PHQ - 2 Score 1 2 2 3  0 0  Altered sleeping 0 1 1 0 2  Tired, decreased energy 0 0 3 2 0  Change in appetite 2 0 1 3 2   Feeling bad or failure about yourself  0 0 0 0 0  Trouble concentrating 1 0 3 2 0  Moving slowly or fidgety/restless 0 0 0 0 0  Suicidal thoughts 0 0 0 0 0  PHQ-9 Score 4 3  10  10  4    Difficult doing work/chores Not difficult at all Not difficult at all Somewhat difficult - Not difficult at all    Details       Data saved with a previous flowsheet row definition   Multiple values from one day are sorted in reverse-chronological order        Interpretation of Total Score  Total Score Depression Severity:  1-4 = Minimal depression, 5-9 = Mild depression, 10-14 = Moderate depression, 15-19 = Moderately severe depression, 20-27 = Severe depression   Psychosocial  Evaluation and Intervention:  Psychosocial Evaluation - 12/26/23 1437       Psychosocial Evaluation & Interventions   Interventions Encouraged to exercise with the program and follow exercise prescription    Comments Kaegan denies any psychosocial barriers or concerns at this time. He has good support from his wife and family. No needs at this time.    Expected Outcomes For Page to participate in PR free of any psychosocial barriers or concerns    Continue Psychosocial Services  No Follow up required          Psychosocial Re-Evaluation:  Psychosocial Re-Evaluation     Row Name 01/07/24 1510 02/06/24 1352           Psychosocial Re-Evaluation   Current issues with None Identified None Identified      Comments Monthly psy/soc re-evaluation is as follows: Gretta denies any psychosocial barriers or concerns at this time. He has good support from his wife and family. He declines needing any additional information or referrals. Monthly psy/soc re-evaluation is as follows: Cable continues to deny any psychosocial barriers or concerns at this time. He has good support from his wife and family. He declines needing any additional information or referrals.      Expected Outcomes For Aadi to participate in PR free of any psychosocial barriers or concerns. For Dickie to participate in PR free  of any psychosocial barriers or concerns.      Interventions Encouraged to attend Pulmonary Rehabilitation for the exercise Encouraged to attend Pulmonary Rehabilitation for the exercise      Continue Psychosocial Services  No Follow up required No Follow up required         Psychosocial Discharge (Final Psychosocial Re-Evaluation):  Psychosocial Re-Evaluation - 02/06/24 1352       Psychosocial Re-Evaluation   Current issues with None Identified    Comments Monthly psy/soc re-evaluation is as follows: Maitland continues to deny any psychosocial barriers or concerns at this time. He has good support from his  wife and family. He declines needing any additional information or referrals.    Expected Outcomes For Roch to participate in PR free of any psychosocial barriers or concerns.    Interventions Encouraged to attend Pulmonary Rehabilitation for the exercise    Continue Psychosocial Services  No Follow up required          Education: Education Goals: Education classes will be provided on a weekly basis, covering required topics. Participant will state understanding/return demonstration of topics presented.  Learning Barriers/Preferences:  Learning Barriers/Preferences - 12/26/23 1338       Learning Barriers/Preferences   Learning Barriers Sight    Learning Preferences None          Education Topics: Know Your Numbers Group instruction that is supported by a PowerPoint presentation. Instructor discusses importance of knowing and understanding resting, exercise, and post-exercise oxygen saturation, heart rate, and blood pressure. Oxygen saturation, heart rate, blood pressure, rating of perceived exertion, and dyspnea are reviewed along with a normal range for these values.  Flowsheet Row PULMONARY REHAB CHRONIC OBSTRUCTIVE PULMONARY DISEASE from 02/07/2024 in [facility]  Date 02/07/24  Educator EP  Instruction Review Code 1- Verbalizes Understanding    Exercise for the Pulmonary Patient Group instruction that is supported by a PowerPoint presentation. Instructor discusses benefits of exercise, core components of exercise, frequency, duration, and intensity of an exercise routine, importance of utilizing pulse oximetry during exercise, safety while exercising, and options of places to exercise outside of rehab.  Flowsheet Row PULMONARY REHAB CHRONIC OBSTRUCTIVE PULMONARY DISEASE from 01/31/2024 in [facility]  Date 01/31/24  Educator EP  Instruction Review Code 1-  Verbalizes Understanding    MET Level  Group instruction provided by PowerPoint, verbal discussion, and written material to support subject matter. Instructor reviews what METs are and how to increase METs.    Pulmonary Medications Verbally interactive group education provided by instructor with focus on inhaled medications and proper administration.   Anatomy and Physiology of the Respiratory System Group instruction provided by PowerPoint, verbal discussion, and written material to support subject matter. Instructor reviews respiratory cycle and anatomical components of the respiratory system and their functions. Instructor also reviews differences in obstructive and restrictive respiratory diseases with examples of each.  Flowsheet Row PULMONARY REHAB CHRONIC OBSTRUCTIVE PULMONARY DISEASE from 01/10/2024 in [facility]  Date 01/10/24  Educator RT  Instruction Review Code 1- Verbalizes Understanding    Oxygen Safety Group instruction provided by PowerPoint, verbal discussion, and written material to support subject matter. There is an overview of What is Oxygen and Why do we need it.  Instructor also reviews how to create a safe environment for oxygen use, the importance of using oxygen as prescribed, and  the risks of noncompliance. There is a brief discussion on traveling with oxygen and resources the patient may utilize.   Oxygen Use Group instruction provided by PowerPoint, verbal discussion, and written material to discuss how supplemental oxygen is prescribed and different types of oxygen supply systems. Resources for more information are provided.    Breathing Techniques Group instruction that is supported by demonstration and informational handouts. Instructor discusses the benefits of pursed lip and diaphragmatic breathing and detailed demonstration on how to perform both.     Risk Factor Reduction Group  instruction that is supported by a PowerPoint presentation. Instructor discusses the definition of a risk factor, different risk factors for pulmonary disease, and how the heart and lungs work together.   Pulmonary Diseases Group instruction provided by PowerPoint, verbal discussion, and written material to support subject matter. Instructor gives an overview of the different type of pulmonary diseases. There is also a discussion on risk factors and symptoms as well as ways to manage the diseases. Flowsheet Row PULMONARY REHAB CHRONIC OBSTRUCTIVE PULMONARY DISEASE from 01/03/2024 in [facility]  Date 01/03/24  Educator RT  Instruction Review Code 1- Verbalizes Understanding    Stress and Energy Conservation Group instruction provided by PowerPoint, verbal discussion, and written material to support subject matter. Instructor gives an overview of stress and the impact it can have on the body. Instructor also reviews ways to reduce stress. There is also a discussion on energy conservation and ways to conserve energy throughout the day.   Warning Signs and Symptoms Group instruction provided by PowerPoint, verbal discussion, and written material to support subject matter. Instructor reviews warning signs and symptoms of stroke, heart attack, cold and flu. Instructor also reviews ways to prevent the spread of infection.   Other Education Group or individual verbal, written, or video instructions that support the educational goals of the pulmonary rehab program.    Knowledge Questionnaire Score:  Knowledge Questionnaire Score - 12/26/23 1433       Knowledge Questionnaire Score   Pre Score 15/18          Core Components/Risk Factors/Patient Goals at Admission:  Personal Goals and Risk Factors at Admission - 12/26/23 1317       Core Components/Risk Factors/Patient Goals on Admission   Improve shortness of breath with ADL's Yes     Intervention Provide education, individualized exercise plan and daily activity instruction to help decrease symptoms of SOB with activities of daily living.    Expected Outcomes Short Term: Improve cardiorespiratory fitness to achieve a reduction of symptoms when performing ADLs;Long Term: Be able to perform more ADLs without symptoms or delay the onset of symptoms          Core Components/Risk Factors/Patient Goals Review:   Goals and Risk Factor Review     Row Name 01/07/24 1511 02/06/24 1353           Core Components/Risk Factors/Patient Goals Review   Personal Goals Review Improve shortness of breath with ADL's;Develop more efficient breathing techniques such as purse lipped breathing and diaphragmatic breathing and practicing self-pacing with activity. Improve shortness of breath with ADL's;Develop more efficient breathing techniques such as purse lipped breathing and diaphragmatic breathing and practicing self-pacing with activity.      Review Monthly review of patient's Core Components/Risk Factors/Patient Goals are as follows: Goal progressing for improving his shortness of breath with ADLs. Admir is exercising on the [place] for 30 minutes. He has completed  2 sessions so far and is building up his endurance. Mavis can report his rate of perceived exertion and his dyspnea level to staff. Goal progressing on developing more efficient breathing techniques such as purse lipped breathing and diaphragmatic breathing and practicing self-pacing with activity. Staff is working with Gretta on when to and how to correctly perform purse lipped breathing. He is still learning diaphragmatic breathing and is practicing at home. Alquan enjoys exercising and working hard in the [employer]. He will continue to benefit from [employer] for exercise, education, and nutrition. Monthly review of patient's Core Components/Risk Factors/Patient Goals are as follows: Goal progressing for improving his shortness of  breath with ADLs. Kabir is exercising on the [place] and recumbent bike, trying to build up his endurance and stamina. Tavares can report his rate of perceived exertion and his dyspnea level to staff. Goal met on developing more efficient breathing techniques such as purse lipped breathing and diaphragmatic breathing and practicing self-pacing with activity. Yassir can correctly perform purse lipped breathing while performing the warmup and while exercising. He practices PLB at home. Jaise enjoys exercising and working hard in the [employer]. He will continue to benefit from [employer] for exercise, education, and nutrition.      Expected Outcomes Pt will show progress toward meeting expected goals and outcomes. Pt will show progress toward meeting expected goals and outcomes.         Core Components/Risk Factors/Patient Goals at Discharge (Final Review):   Goals and Risk Factor Review - 02/06/24 1353       Core Components/Risk Factors/Patient Goals Review   Personal Goals Review Improve shortness of breath with ADL's;Develop more efficient breathing techniques such as purse lipped breathing and diaphragmatic breathing and practicing self-pacing with activity.    Review Monthly review of patient's Core Components/Risk Factors/Patient Goals are as follows: Goal progressing for improving his shortness of breath with ADLs. Dene is exercising on the [place] and recumbent bike, trying to build up his endurance and stamina. Jessy can report his rate of perceived exertion and his dyspnea level to staff. Goal met on developing more efficient breathing techniques such as purse lipped breathing and diaphragmatic breathing and practicing self-pacing with activity. Anita can correctly perform purse lipped breathing while performing the warmup and while exercising. He practices PLB at home. Joran enjoys exercising and working hard in the [employer]. He will continue to benefit from [employer] for exercise, education, and  nutrition.    Expected Outcomes Pt will show progress toward meeting expected goals and outcomes.          ITP Comments:   Comments: Pt is making expected progress toward Pulmonary Rehab goals after completing 11 session(s). Recommend continued exercise, life style modification, education, and utilization of breathing techniques to increase stamina and strength, while also decreasing shortness of breath with exertion.  Dr. Slater Staff is Medical Director for Pulmonary Rehab at [facility].       [1]  Current Outpatient Medications:    albuterol  (VENTOLIN  HFA) 108 (90 Base) MCG/ACT inhaler, Inhale 2 puffs into the lungs every 6 (six) hours as needed for wheezing or shortness of breath., Disp: 8 g, Rfl: 11   b complex vitamins tablet, Take 1 tablet by mouth daily., Disp: 100 tablet, Rfl: 3   Cholecalciferol (VITAMIN D3) 50 MCG (2000 UT) capsule, Take 2 capsules (4,000 Units total) by mouth daily., Disp: 100 capsule, Rfl: 3   guaiFENesin  (MUCINEX ) 600 MG 12 hr tablet, Take 1 tablet (600  mg total) by mouth 2 (two) times daily., Disp: 60 tablet, Rfl: 1   ipratropium-albuterol  (DUONEB) 0.5-2.5 (3) MG/3ML SOLN, Take 3 mLs by nebulization every 4 (four) hours as needed., Disp: 360 mL, Rfl: 6   latanoprost  (XALATAN ) 0.005 % ophthalmic solution, Place 1 drop into the left eye at bedtime., Disp: , Rfl:    Multiple Vitamin (MULTIVITAMIN WITH MINERALS) TABS tablet, Take 1 tablet by mouth daily., Disp: 30 tablet, Rfl: 0   TRELEGY ELLIPTA  100-62.5-25 MCG/ACT AEPB, TAKE 1 PUFF BY MOUTH EVERY DAY, Disp: 60 each, Rfl: 5 [2]  Social History Tobacco Use  Smoking Status Former   Current packs/day: 0.00   Average packs/day: 1.0 packs/day   Types: Cigarettes   Quit date: 08/21/2023   Years since quitting: 0.4  Smokeless Tobacco Never  Tobacco Comments   Quit smoking July 2025

## 2024-02-15 ENCOUNTER — Other Ambulatory Visit: Payer: Self-pay

## 2024-02-15 ENCOUNTER — Ambulatory Visit: Payer: Self-pay

## 2024-02-15 ENCOUNTER — Emergency Department (HOSPITAL_COMMUNITY)

## 2024-02-15 ENCOUNTER — Emergency Department (HOSPITAL_COMMUNITY)
Admission: EM | Admit: 2024-02-15 | Discharge: 2024-02-15 | Disposition: A | Discharge status: Home / Self Care | Source: Ambulatory Visit

## 2024-02-15 DIAGNOSIS — J441 Chronic obstructive pulmonary disease with (acute) exacerbation: Secondary | ICD-10-CM | POA: Diagnosis not present

## 2024-02-15 DIAGNOSIS — Z7951 Long term (current) use of inhaled steroids: Secondary | ICD-10-CM | POA: Diagnosis not present

## 2024-02-15 DIAGNOSIS — R059 Cough, unspecified: Secondary | ICD-10-CM | POA: Diagnosis present

## 2024-02-15 DIAGNOSIS — R0902 Hypoxemia: Secondary | ICD-10-CM

## 2024-02-15 LAB — BASIC METABOLIC PANEL WITH GFR
Anion gap: 12 (ref 5–15)
BUN: 10 mg/dL (ref 8–23)
CO2: 29 mmol/L (ref 22–32)
Calcium: 9.5 mg/dL (ref 8.9–10.3)
Chloride: 100 mmol/L (ref 98–111)
Creatinine, Ser: 0.82 mg/dL (ref 0.61–1.24)
GFR, Estimated: >60 mL/min
Glucose, Bld: 98 mg/dL (ref 70–99)
Potassium: 3.9 mmol/L (ref 3.5–5.1)
Sodium: 141 mmol/L (ref 135–145)

## 2024-02-15 LAB — CBC
HCT: 42.5 % (ref 39.0–52.0)
Hemoglobin: 14 g/dL (ref 13.0–17.0)
MCH: 31.2 pg (ref 26.0–34.0)
MCHC: 32.9 g/dL (ref 30.0–36.0)
MCV: 94.7 fL (ref 80.0–100.0)
Platelets: 320 K/uL (ref 150–400)
RBC: 4.49 MIL/uL (ref 4.22–5.81)
RDW: 12.4 % (ref 11.5–15.5)
WBC: 7.2 K/uL (ref 4.0–10.5)
nRBC: 0 % (ref 0.0–0.2)

## 2024-02-15 LAB — D-DIMER, QUANTITATIVE: D-Dimer, Quant: 0.52 ug{FEU}/mL — ABNORMAL HIGH (ref 0.00–0.50)

## 2024-02-15 LAB — RESP PANEL BY RT-PCR (RSV, FLU A&B, COVID)  RVPGX2
Influenza A by PCR: NEGATIVE
Influenza B by PCR: NEGATIVE
Resp Syncytial Virus by PCR: NEGATIVE
SARS Coronavirus 2 by RT PCR: NEGATIVE

## 2024-02-15 LAB — MAGNESIUM: Magnesium: 1.7 mg/dL (ref 1.7–2.4)

## 2024-02-15 MED ORDER — PREDNISONE 10 MG PO TABS: 40.0000 mg | ORAL_TABLET | Freq: Every day | ORAL | 0 refills | Status: AC

## 2024-02-15 MED ORDER — IPRATROPIUM-ALBUTEROL 0.5-2.5 (3) MG/3ML IN SOLN
3.0000 mL | Freq: Once | RESPIRATORY_TRACT | Status: AC
  Administered 2024-02-15: 3 mL via RESPIRATORY_TRACT
  Filled 2024-02-15: qty 3

## 2024-02-15 MED ORDER — METHYLPREDNISOLONE SODIUM SUCC 125 MG IJ SOLR
125.0000 mg | Freq: Once | INTRAMUSCULAR | Status: AC
  Administered 2024-02-15: 125 mg via INTRAVENOUS
  Filled 2024-02-15: qty 2

## 2024-02-15 NOTE — Discharge Instructions (Signed)
 You were seen today for low oxygen saturations at home. While you were here we monitored your vitals, preformed a physical exam, and labs and imaging. These were all reassuring and there is no indication for any further testing or intervention in the emergency department at this time.   Things to do:  - Follow up with your primary care provider within the next 1-2 weeks - Please pick up your prescription for steroids and take 4 tablets by mouth daily for 4 days  Return to the emergency department if you have any new or worsening symptoms including worsening shortness of breath, continued hypoxia or low O2 sats at home, chest pain, inability tolerate p.o., fevers nonmetal Tylenol  Motrin , or if you have any other concerns.

## 2024-02-15 NOTE — ED Triage Notes (Signed)
 PT states oxygen was dropping to 88% on 2l/[place]. Pt is always on 2l and was requiring more to get sats up to the 90s. Productive cough x 2 days. Denies pain.

## 2024-02-15 NOTE — ED Notes (Signed)
 Wife is coming to pick patient up

## 2024-02-15 NOTE — ED Provider Notes (Signed)
 " [facility] Provider Note   CSN: [id number] Arrival date & time: 02/15/24  1044     Patient presents with: Shortness of Breath   Travis Hicks is a 73 y.o. 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  Patient states that he has had to increase his oxygen requirement at home and has been notably more short of breath at rest.  He states that his normal oxygen saturation is around 94% however the last few days he has noticed that it is dropped below 90%.  Patient contacted his pulmonologist who stated to be seen at the emergency department for further evaluation.  Patient has been utilizing home albuterol  treatments with minimal relief. Patient denies any symptoms such as fever, chills, myalgias, or headache.  The patient denies chest pain. The patient is in no acute distress.    Shortness of Breath      Prior to Admission medications  Medication Sig Start Date End Date Taking? Authorizing Provider  albuterol  (VENTOLIN  HFA) 108 (90 Base) MCG/ACT inhaler Inhale 2 puffs into the lungs every 6 (six) hours as needed for wheezing or shortness of breath. 03/27/23   Kara Dorn NOVAK, MD  b complex vitamins tablet Take 1 tablet by mouth daily. 01/14/13   Plotnikov, Aleksei V, MD  Cholecalciferol (VITAMIN D3) 50 MCG (2000 UT) capsule Take 2 capsules (4,000 Units total) by mouth daily. 03/11/20   Plotnikov, Aleksei V, MD  guaiFENesin  (MUCINEX ) 600 MG 12 hr tablet Take 1 tablet (600 mg total) by mouth 2 (two) times daily. 10/01/21   Regalado, Belkys A, MD  ipratropium-albuterol  (DUONEB) 0.5-2.5 (3) MG/3ML SOLN Take 3 mLs by nebulization every 4 (four) hours as needed. 03/27/23   Kara Dorn NOVAK, MD  latanoprost  (XALATAN ) 0.005 % ophthalmic solution Place 1 drop into the left eye at bedtime. 12/01/16   [provider]  Multiple Vitamin (MULTIVITAMIN WITH  MINERALS) TABS tablet Take 1 tablet by mouth daily. 10/02/21   Regalado, Owen LABOR, MD  TRELEGY ELLIPTA  100-62.5-25 MCG/ACT AEPB TAKE 1 PUFF BY MOUTH EVERY DAY 02/01/24   Kara Dorn NOVAK, MD    Allergies: Donepezil  and Viagra [sildenafil citrate]    Review of Systems  Respiratory:  Positive for shortness of breath.     Updated Vital Signs BP (!) 128/95   Pulse (!) 107   Temp 97.9 F (36.6 C) (Oral)   Resp (!) 26   SpO2 94%   Physical Exam Vitals and nursing note reviewed.  Constitutional:      General: He is awake. He is not in acute distress.    Appearance: Normal appearance. He is not toxic-appearing.  HENT:     Head: Normocephalic and atraumatic.     Nose: Congestion and rhinorrhea present. Rhinorrhea is clear.     Mouth/Throat:     Mouth: Mucous membranes are moist.     Pharynx: Uvula midline. No pharyngeal swelling, oropharyngeal exudate, posterior oropharyngeal erythema or uvula swelling.     Tonsils: No tonsillar exudate or tonsillar abscesses.  Eyes:     General: Lids are normal. Vision grossly intact.     Extraocular Movements: Extraocular movements intact.     Conjunctiva/sclera: Conjunctivae normal.     Pupils: Pupils are equal, round, and reactive to light.  Cardiovascular:     Rate and Rhythm: Normal rate and regular rhythm.     Pulses:  Radial pulses are 2+ on the right side.  Pulmonary:     Effort: Pulmonary effort is normal. No respiratory distress.     Breath sounds: Examination of the right-upper field reveals wheezing. Examination of the left-upper field reveals wheezing. Wheezing present.     Comments: Patient has no difficulty speaking in complete sentences.  There is mild wheezing to the upper lobes bilaterally.  Patient is currently in no respiratory distress. Abdominal:     General: Abdomen is flat.     Palpations: Abdomen is soft.     Tenderness: There is no abdominal tenderness.  Musculoskeletal:     Cervical back: Full passive range  of motion without pain and neck supple. No rigidity. No spinous process tenderness.  Lymphadenopathy:     Cervical: Cervical adenopathy present.  Skin:    General: Skin is warm and dry.     Capillary Refill: Capillary refill takes less than 2 seconds.     Findings: No rash.  Neurological:     General: No focal deficit present.     Mental Status: He is alert.  Psychiatric:        Attention and Perception: Attention normal.        Mood and Affect: Mood normal.        Speech: Speech normal.     (all labs ordered are listed, but only abnormal results are displayed) Labs Reviewed             All other components within normal limits  RESP PANEL BY RT-PCR (RSV, FLU A&B, COVID)  [id number]  BASIC METABOLIC PANEL WITH GFR  CBC  MAGNESIUM    EKG: EKG Interpretation Date/Time:  Friday February 15 2024 11:29:35 EST Ventricular Rate:  108 PR Interval:  160 QRS Duration:  84 QT Interval:  354 QTC Calculation: 474 R Axis:   80  Text Interpretation: Sinus tachycardia with frequent Premature ventricular complexes Otherwise normal ECG Confirmed by Franklyn Gills [phone]) on 02/15/2024 3:17:33 PM  Radiology: No results found.    Procedures   Medications Ordered in the ED  methylPREDNISolone  sodium succinate (SOLU-MEDROL ) 125 mg/2 mL injection 125 mg (125 mg Intravenous Given 02/15/24 1351)  ipratropium-albuterol  (DUONEB) 0.5-2.5 (3) MG/3ML nebulizer solution 3 mL (3 mLs Nebulization Given 02/15/24 1353)  ipratropium-albuterol  (DUONEB) 0.5-2.5 (3) MG/3ML nebulizer solution 3 mL (3 mLs Nebulization Given 02/15/24 1353)                                  Medical Decision Making Amount and/or Complexity of Data Reviewed Labs: ordered. Radiology: ordered.   Patient presents to the ED for: Shortness of breath This involves an extensive number of treatment options  Differential diagnosis includes:  Infectious etiology Chronic exacerbation PE Co-morbid conditions: COPD,  hypertension  Additional history/records obtained and reviewed: Additional history obtained from  outside medical records External records from outside source obtained and reviewed including visits with patient's pulmonologist.  Clinical Course as of 02/21/24 1520  Fri Feb 15, 2024  1329 Temp: 98.2 F (36.8 C) Afebrile, vital stable, patient in no acute distress, [ML]  1416 Basic metabolic panel WNL [ML]  1416 CBC WNL [ML]  1416 Resp panel by RT-PCR (RSV, Flu A&B, Covid) Anterior Nasal Swab Negative [ML]  1417 DG Chest 2 View No acute findings [ML]  1417 DuoNeb x 2 and Solu-Medrol  for symptomatic relief-well-tolerated [ML]  1417 ED EKG Sinus tach with frequent PVCs [ML]  1500  D-dimer pending at handoff -  [ML]  1501 -Stable -sent by pulm for low 02 sat at home [SE]    Clinical Course User Index [ML] [name], PA [SE] [name], MD    Data Reviewed / Actions Taken: Labs ordered/reviewed with my independent interpretation in ED course above. Imaging ordered/reviewed with my independent interpretation in ED course above. I agree with the radiologists interpretation.  EKG ordered/reviewed with my independent interpretation in ED course above. The patient was kept on continuous cardiac monitoring during the ED stay.  Management / Treatments: See ED course above for medications, treatments administered, and clinical rationale.   Reevaluation of the patient after these medicines showed that the patient improved.   I have reviewed the patients home medicines and have made adjustments as needed  ED Course / Reassessments: Problem List: Shortness of breath 73 year old male presented for shortness of breath. Initial assessment included history, physical exam, and review of prior medical records. Laboratory testing was obtained given clinical presentation and was overall reassuring with no acute findings. D-dimer was pending at the time of patient handoff.  There is low  clinical suspicion for bacterial etiology requiring antibiotics at this time based on workup and chest x-ray imaging showing chronic COPD changes.  Patient remained mildly tachycardic for the duration of visit, however patient's EKG demonstrated no acute ischemia, and upon review of prior medical records patient's baseline is mildly tachycardic.  The patient was treated symptomatically with bronchodilators, steroids, and supportive care.  Overall, patient symptoms most likely chronic COPD exacerbation. The patient remained stable during the ED course, and pending D-dimer study and ambulatory oxygen evaluation, patient should be deemed appropriate for outpatient management.  Patient response: Improved with ED symptomatic management Serial reassessments performed: Yes    Disposition: Disposition: Discharge pending - handoff to provider Guillermina MD for further evaluation of clinical stability after workup is finished to determine discharge eligibility. The disposition plan and rationale were discussed with the patient at the bedside, all questions were addressed, and the patient demonstrated understanding.  This note was produced using Electronics Engineer. While I have reviewed and verified all clinical information, transcription errors may remain.      Final diagnoses:  Hypoxia  COPD exacerbation [facility])    ED Discharge Orders          Ordered    predniSONE  (DELTASONE ) 10 MG tablet  Daily        02/15/24 1503               [name], [place] 02/21/24 1520    [name], MD 02/25/24 1610  "

## 2024-02-15 NOTE — ED Provider Notes (Signed)
 Assume Care - Medical Decision Making  Care of patient assumed from previous emergency medicine provider at 1500. See their note for further details of history, physical exam and plan.  Briefly, Travis Hicks is a 73 y.o. male who presents with: Shortness of breath.  Clinical Course as of 02/15/24 1648  Fri Feb 15, 2024  1329 Temp: 98.2 F (36.8 C) Afebrile, vital stable, patient in no acute distress, [ML]  1416 Basic metabolic panel WNL [ML]  1416 CBC WNL [ML]  1416 Resp panel by RT-PCR (RSV, Flu A&B, Covid) Anterior Nasal Swab Negative [ML]  1417 DG Chest 2 View No acute findings [ML]  1417 DuoNeb x 2 and Solu-Medrol  for symptomatic relief-well-tolerated [ML]  1417 ED EKG Sinus tach with frequent PVCs [ML]  1501 -Stable -sent by pulm for low 02 sat at home [SE]    Clinical Course User Index [ML] [name], PA [SE] [name], MD     Reassessment: I personally reassessed the patient:  Vital Signs:  The most current vitals were  Vitals:   02/15/24 1112 02/15/24 1119 02/15/24 1350 02/15/24 1523  BP: (!) 129/97  102/78   Pulse: (!) 53 (!) 45 (!) 104   Resp: (!) 23 20 18    Temp:    97.9 F (36.6 C)  TempSrc:    Oral  SpO2: 96% 95% 98%    Hemodynamics:  The patient is hemodynamically stable. Mental Status:  The patient is alert  Additional MDM/ED Course: Patient hemodynamically stable at the time of handoff.  D-dimer resulted and negative with age adjustment.  Given unremarkable workup otherwise feel most likely diagnosis COPD exacerbation.  Patient will be discharged home with 4 days of prednisone .  Patient was given strict return precautions and plans to follow-up with his PCP in the outpatient setting.  Patient discharged home in hemodynamically stable condition.  Disposition:  I discussed the plan for discharge with the patient and/or their surrogate at bedside prior to discharge and they were in agreement with the plan and verbalized understanding of the  return precautions provided. All questions answered to the best of my ability. Ultimately, the patient was discharged in stable condition with stable vital signs. I am reassured that they are capable of close follow up and good social support at home.   Clinical Impression:  1. Hypoxia   2. COPD exacerbation (HCC)     Rx / DC Orders ED Discharge Orders          Ordered    predniSONE  (DELTASONE ) 10 MG tablet  Daily        02/15/24 1503            The plan for this patient was discussed with Dr. Franklyn, who voiced agreement and who oversaw evaluation and treatment of this patient.   Electronically signed by:   [name], M.D. PGY-2, Emergency Medicine      [name], MD 02/15/24 7694    [name], MD 02/16/24 1556

## 2024-02-15 NOTE — Telephone Encounter (Signed)
 FYI Only or Action Required?: FYI only for provider: ED advised.  Patient was last seen in primary care on 01/28/2024 by Geofm Glade PARAS, MD.  Called Nurse Triage reporting Cough.  Symptoms began several days ago.  Interventions attempted: Prescription medications: Mucinex , inhalers, nebulizer, oxygen 2L/[place].  Symptoms are: gradually worsening.  Triage Disposition: Go to ED Now (or PCP Triage)  Patient/caregiver understands and will follow disposition?: Yes     Copied from CRM #[id number]. Topic: Clinical - Red Word Triage >> Feb 15, 2024  8:45 AM Travis Hicks wrote: Red Word that prompted transfer to Nurse Triage: Pt oxygen levels at 86. Yellow mucus. Reason for Disposition  Patient sounds very sick or weak to the triager  Answer Assessment - Initial Assessment Questions Patient says on Wednesday he sent a MyChart message to Dr. Kara Pulmonary regarding low oxygen level 88% and coughing yellow mucus. He did not receive a reply, so he call the office yesterday for pulmonary and got the nurse line who advised ED. He says he waiting and the oxygen level went up, so he call back and the nurse told him to just wait to reach out today when the office opened. He says he decided to call primary office today. Currently oxygen level 85% on 2 L/[place]. He said he did increase it to 3 L on yesterday to see if the oxygen level would increase, it didn't, so he cut it back to 2 L, using inhalers as prescribed, no OTC medications, taking mucinex  1 tab Q 12 hours. Advised since pulmonary is closed today and nothing available in office today, ED would be the best choice. He denies CP, worsening SOB from chronic SOB. He says he will go to [facility] ED.    1. MAIN CONCERN OR SYMPTOM: What's your main concern? (e.g., low oxygen level, breathing difficulty) What question do you have?     Low oxygen  2.  OXYGEN EQUIPMENT:  Are you having trouble with your oxygen equipment?  (e.g., cannula, mask, tubing,  tank, concentrator)     No  3. ONSET: When did the symptoms  start?      Wednesday  4. OXYGEN THERAPY:      2 L/[place]   6. O2 MONITORING: What is the oxygen level (pulse ox reading)? (e.g., 70-100%)     85 and normally 94-95  7. VITAL SIGNS MONITORING: Do you monitor/measure your oxygen level or vital signs (e.g., yes, no, measurements are automatically sent to provider/call center). Document CURRENT and NORMAL BASELINE values if available.       Yes for oxygen level, normally 94-95  8. BREATHING DIFFICULTY: Are you having any difficulty breathing? If Yes, ask: How bad is it?  (e.g., none, mild, moderate, severe)      Yes, but not worse than normal SOB  9. OTHER SYMPTOMS: Do you have any other symptoms? (e.g., fever, change in sputum)     Cough up yellow mucus  Answer Assessment - Initial Assessment Questions 1. ONSET: When did the cough begin?      Wednesday    3. SPUTUM: Describe the color of your sputum (e.g., none, dry cough; clear, white, yellow, green)     Yellow   5. DIFFICULTY BREATHING: Are you having difficulty breathing? If Yes, ask: How bad is it? (e.g., mild, moderate, severe)      Yes, but not new  6. FEVER: Do you have a fever? If Yes, ask: What is your temperature, how was it measured, and  when did it start?     No   8. LUNG HISTORY: Do you have any history of lung disease?  (e.g., pulmonary embolus, asthma, emphysema)     COPD    10. OTHER SYMPTOMS: Do you have any other symptoms? (e.g., runny nose, wheezing, chest pain)       Low oxygen level  Protocols used: Cough - Acute Productive-A-AH, COPD Oxygen Monitoring and Hypoxia-A-AH

## 2024-02-19 ENCOUNTER — Encounter (HOSPITAL_COMMUNITY)
Admission: RE | Admit: 2024-02-19 | Discharge: 2024-02-19 | Disposition: A | Discharge status: Home / Self Care | Source: Ambulatory Visit | Attending: Internal Medicine | Admitting: Internal Medicine

## 2024-02-19 DIAGNOSIS — J449 Chronic obstructive pulmonary disease, unspecified: Secondary | ICD-10-CM

## 2024-02-19 NOTE — Progress Notes (Signed)
 Incomplete Session Note  Patient Details  Name: Travis Hicks MRN: [id number] Date of Birth: Sep 02, 1950 Referring Provider:   Conrad Ports Pulmonary Rehab Walk Test from 12/26/2023 in [facility]  Referring Provider Plotnikov    Travis Hicks did not complete his rehab session.  Travis Hicks arrive in [facility] but stated he didn't feel well to exercise. Travis Hicks left before warm up.

## 2024-02-19 NOTE — Progress Notes (Signed)
 Pt not feeling well when he arrived to Pulmonary Rehab. Decided to go home.  Aliene Aris BS, ACSM-CEP 02/19/2024 3:10 PM

## 2024-02-25 ENCOUNTER — Telehealth (HOSPITAL_COMMUNITY): Payer: Self-pay

## 2024-02-25 NOTE — Telephone Encounter (Signed)
 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 Cancelled his appts, removed from schedule.

## 2024-02-25 NOTE — Progress Notes (Signed)
 Discharge Progress Report  Patient Details  Name: Travis Hicks MRN: [id number] Date of Birth: 07-Dec-1950 Referring Provider:   [facility] Walk Test from 12/26/2023 in [facility]  Referring Provider Plotnikov     Number of Visits: 11  Reason for Discharge:  Early Exit:  Personal  Smoking History:  Tobacco Use History[1]  Diagnosis:  Stage 3 severe COPD by GOLD classification (HCC)  ADL UCSD:  Pulmonary Assessment Scores     Row Name 12/26/23 1432         ADL UCSD   ADL Phase Entry     SOB Score total 91       CAT Score   CAT Score 22       mMRC Score   mMRC Score 4        Initial Exercise Prescription:  Initial Exercise Prescription - 12/26/23 1400       Date of Initial Exercise RX and Referring Provider   Date 12/26/23    Referring Provider Plotnikov    Expected Discharge Date 04/01/24      Oxygen   Oxygen Continuous    Liters 2    Maintain Oxygen Saturation 88% or higher      Recumbant Bike   Level 1    RPM 56    Minutes 15    METs 1.8      NuStep   Level 1    SPM 67    Minutes 15    METs 1.9      Prescription Details   Frequency (times per week) 2    Duration Progress to 30 minutes of continuous aerobic without signs/symptoms of physical distress      Intensity   THRR 40-80% of Max Heartrate 59-118    Ratings of Perceived Exertion 11-13    Perceived Dyspnea 0-4      Progression   Progression Continue to progress workloads to maintain intensity without signs/symptoms of physical distress.      Resistance Training   Training Prescription No          Discharge Exercise Prescription (Final Exercise Prescription Changes):  Exercise Prescription Changes - 02/12/24 1500       Response to Exercise   Blood Pressure (Admit) 130/80    Blood Pressure (Exercise) 160/80    Blood Pressure (Exit) 110/72    Heart Rate (Admit) 104 bpm    Heart Rate (Exercise)  108 bpm    Heart Rate (Exit) 54 bpm   known PVCs   Oxygen Saturation (Admit) 94 %    Oxygen Saturation (Exercise) 94 %    Oxygen Saturation (Exit) 94 %    Rating of Perceived Exertion (Exercise) 15    Perceived Dyspnea (Exercise) 3    Duration Continue with 30 min of aerobic exercise without signs/symptoms of physical distress.    Intensity THRR New      Progression   Progression Continue to progress workloads to maintain intensity without signs/symptoms of physical distress.      Resistance Training   Weight red bands    Reps 10-15    Time 10 Minutes      Oxygen   Oxygen Continuous    Liters 2      Recumbant Bike   Level 2    RPM 51    Minutes 15    METs 2.2      NuStep   Level 1    SPM 83  Minutes 15    METs 2      Oxygen   Maintain Oxygen Saturation 88% or higher          Functional Capacity:  6 Minute Walk     Row Name 12/26/23 1427         6 Minute Walk   Phase Initial     Distance 630 feet     Walk Time 6 minutes     # of Rest Breaks 4  1:49-2:12; 2:46-3:10; 4:11-4:25; 5:12-5:44     MPH 1.19     METS 2.59     RPE 13     Perceived Dyspnea  4     VO2 Peak 9.06     Resting HR 90 bpm     Resting BP 140/70     Resting Oxygen Saturation  95 %     Exercise Oxygen Saturation  during 6 min walk 93 %     Max Ex. HR 132 bpm     Max Ex. BP 152/80     2 Minute Post BP 150/80       Interval HR   1 Minute HR 132     2 Minute HR 109     3 Minute HR 132     4 Minute HR 132     5 Minute HR 131     6 Minute HR 118     2 Minute Post HR 110     Interval Heart Rate? Yes       Interval Oxygen   Interval Oxygen? Yes     Baseline Oxygen Saturation % 95 %     1 Minute Oxygen Saturation % 96 %     1 Minute Liters of Oxygen 2 L     2 Minute Oxygen Saturation % 94 %     2 Minute Liters of Oxygen 2 L     3 Minute Oxygen Saturation % 94 %     3 Minute Liters of Oxygen 2 L     4 Minute Oxygen Saturation % 94 %     4 Minute Liters of Oxygen 2 L     5  Minute Oxygen Saturation % 93 %     5 Minute Liters of Oxygen 2 L     6 Minute Oxygen Saturation % 93 %     6 Minute Liters of Oxygen 2 L     2 Minute Post Oxygen Saturation % 95 %     2 Minute Post Liters of Oxygen 2 L        Psychological, QOL, Others - Outcomes: PHQ 2/9:    01/28/2024    2:32 PM 12/26/2023    1:15 PM 12/14/2022    3:10 PM 11/30/2022    1:40 PM 01/09/2022    2:25 PM  Depression screen PHQ 2/9  Decreased Interest 1 2 2 3  0  Down, Depressed, Hopeless 0 0 0 0 0  PHQ - 2 Score 1 2 2 3  0  Altered sleeping 0 1 1 0 2  Tired, decreased energy 0 0 3 2 0  Change in appetite 2 0 1 3 2   Feeling bad or failure about yourself  0 0 0 0 0  Trouble concentrating 1 0 3 2 0  Moving slowly or fidgety/restless 0 0 0 0 0  Suicidal thoughts 0 0 0 0 0  PHQ-9 Score 4 3  10  10  4    Difficult doing work/chores Not difficult at  all Not difficult at all Somewhat difficult  Not difficult at all     Data saved with a previous flowsheet row definition    Quality of Life:   Personal Goals: Goals established at orientation with interventions provided to work toward goal.  Personal Goals and Risk Factors at Admission - 12/26/23 1317       Core Components/Risk Factors/Patient Goals on Admission   Improve shortness of breath with ADL's Yes    Intervention Provide education, individualized exercise plan and daily activity instruction to help decrease symptoms of SOB with activities of daily living.    Expected Outcomes Short Term: Improve cardiorespiratory fitness to achieve a reduction of symptoms when performing ADLs;Long Term: Be able to perform more ADLs without symptoms or delay the onset of symptoms           Personal Goals Discharge:  Goals and Risk Factor Review     Row Name 01/07/24 1511 02/06/24 1353 02/25/24 1550         Core Components/Risk Factors/Patient Goals Review   Personal Goals Review Improve shortness of breath with ADL's;Develop more efficient breathing  techniques such as purse lipped breathing and diaphragmatic breathing and practicing self-pacing with activity. Improve shortness of breath with ADL's;Develop more efficient breathing techniques such as purse lipped breathing and diaphragmatic breathing and practicing self-pacing with activity. Improve shortness of breath with ADL's     Review Monthly review of patient's Core Components/Risk Factors/Patient Goals are as follows: Goal progressing for improving his shortness of breath with ADLs. Kye is exercising on the Nustep for 30 minutes. He has completed 2 sessions so far and is building up his endurance. Lyndol can report his rate of perceived exertion and his dyspnea level to staff. Goal progressing on developing more efficient breathing techniques such as purse lipped breathing and diaphragmatic breathing and practicing self-pacing with activity. Staff is working with Gretta on when to and how to correctly perform purse lipped breathing. He is still learning diaphragmatic breathing and is practicing at home. Avyay enjoys exercising and working hard in the [employer]. He will continue to benefit from [employer] for exercise, education, and nutrition. Monthly review of patient's Core Components/Risk Factors/Patient Goals are as follows: Goal progressing for improving his shortness of breath with ADLs. Niall is exercising on the Nustep and recumbent bike, trying to build up his endurance and stamina. Dannel can report his rate of perceived exertion and his dyspnea level to staff. Goal met on developing more efficient breathing techniques such as purse lipped breathing and diaphragmatic breathing and practicing self-pacing with activity. Devarious can correctly perform purse lipped breathing while performing the warmup and while exercising. He practices PLB at home. Kaidin enjoys exercising and working hard in the [employer]. He will continue to benefit from [employer] for exercise, education, and nutrition. Cedar was  discharged from the PR program on 02/25/24.  Unfortunately, he only attended 11 sessions therefore did not meet his goal for improving shortness of breath with ADL's.     Expected Outcomes Pt will show progress toward meeting expected goals and outcomes. Pt will show progress toward meeting expected goals and outcomes. Pt will continue to exercise after discharge        Exercise Goals and Review:  Exercise Goals     Row Name 12/26/23 1316             Exercise Goals   Increase Physical Activity Yes       Intervention Provide advice, education, support and  counseling about physical activity/exercise needs.;Develop an individualized exercise prescription for aerobic and resistive training based on initial evaluation findings, risk stratification, comorbidities and participant's personal goals.       Expected Outcomes Short Term: Attend rehab on a regular basis to increase amount of physical activity.;Long Term: Exercising regularly at least 3-5 days a week.;Long Term: Add in home exercise to make exercise part of routine and to increase amount of physical activity.       Increase Strength and Stamina Yes       Intervention Provide advice, education, support and counseling about physical activity/exercise needs.;Develop an individualized exercise prescription for aerobic and resistive training based on initial evaluation findings, risk stratification, comorbidities and participant's personal goals.       Expected Outcomes Short Term: Perform resistance training exercises routinely during rehab and add in resistance training at home;Long Term: Improve cardiorespiratory fitness, muscular endurance and strength as measured by increased METs and functional capacity ( );Short Term: Increase workloads from initial exercise prescription for resistance, speed, and METs.       Able to understand and use rate of perceived exertion (RPE) scale Yes       Intervention Provide education and explanation on how  to use RPE scale       Expected Outcomes Short Term: Able to use RPE daily in rehab to express subjective intensity level;Long Term:  Able to use RPE to guide intensity level when exercising independently       Able to understand and use Dyspnea scale Yes       Intervention Provide education and explanation on how to use Dyspnea scale       Expected Outcomes Short Term: Able to use Dyspnea scale daily in rehab to express subjective sense of shortness of breath during exertion;Long Term: Able to use Dyspnea scale to guide intensity level when exercising independently       Knowledge and understanding of Target Heart Rate Range (THRR) Yes       Intervention Provide education and explanation of THRR including how the numbers were predicted and where they are located for reference       Expected Outcomes Short Term: Able to state/look up THRR;Long Term: Able to use THRR to govern intensity when exercising independently;Short Term: Able to use daily as guideline for intensity in rehab       Understanding of Exercise Prescription Yes       Intervention Provide education, explanation, and written materials on patient's individual exercise prescription       Expected Outcomes Short Term: Able to explain program exercise prescription;Long Term: Able to explain home exercise prescription to exercise independently          Exercise Goals Re-Evaluation:  Exercise Goals Re-Evaluation     Row Name 01/07/24 1335 02/08/24 1358 02/25/24 1442         Exercise Goal Re-Evaluation   Exercise Goals Review Increase Physical Activity;Able to understand and use Dyspnea scale;Understanding of Exercise Prescription;Increase Strength and Stamina;Knowledge and understanding of Target Heart Rate Range (THRR);Able to understand and use rate of perceived exertion (RPE) scale Increase Physical Activity;Able to understand and use Dyspnea scale;Understanding of Exercise Prescription;Increase Strength and Stamina;Knowledge and  understanding of Target Heart Rate Range (THRR);Able to understand and use rate of perceived exertion (RPE) scale Increase Physical Activity;Able to understand and use Dyspnea scale;Understanding of Exercise Prescription;Increase Strength and Stamina;Knowledge and understanding of Target Heart Rate Range (THRR);Able to understand and use rate of perceived exertion (RPE) scale  Comments Pt has completed 2 exercise sessions. He is exercising on the recumbent stepper for 15 min, level 1, METs 1.8. He then is exercising on the recumbent bike for 15 min, level 1, METs 1.8. It is too early to discern progression however pt had to sit less for warm up and cool down his past session compared to the first. Using red bands for resistance training, 3.7 lbs. Will progress as tolerated. Pt has completed 10 exercise sessions. He is exercising on the recumbent stepper for 15 min, level 1, METs 1.9. He then is exercising on the recumbent bike for 15 min, level 2, METs 2.2. He is very slowly progressing, he is quite deconditioned. Using red bands for resistance training, 3.7 lbs, supported squats. Will progress as tolerated. Pt completed 11 exercise sessions. He had an ER visit due to his SOB and decided he needed to be discharged from PR. His peak METs were 2.1-2.3, only trivial progression from his initial visit.     Expected Outcomes Through exercise at rehab and home, the patient will decrease shortness of breath with daily activities and feel confident in carrying out an exercise regimen at home. Through exercise at rehab and home, the patient will decrease shortness of breath with daily activities and feel confident in carrying out an exercise regimen at home. Through exercise at rehab and home, the patient will decrease shortness of breath with daily activities and feel confident in carrying out an exercise regimen at home.        Nutrition & Weight - Outcomes:  Pre Biometrics - 12/26/23 1438       Pre Biometrics    Grip Strength 34 kg           Nutrition:  Nutrition Therapy & Goals - 01/01/24 1437       Nutrition Therapy   Diet Regular diet      Personal Nutrition Goals   Nutrition Goal Patient to identify strategies for weight gain of 0.5-2 # per week.    Personal Goal #2 Patient to include rich source of protein with each meal/snack.    Comments Patient with history of stage 3 COPD, malnutrition. Reports difficulty maintaining weight due to lack of appetite and early satiety. Reports loss of taste/smell over past 8-10 years which complicates intake. Currently underweight based on recommended BMI of 23-27 kg/m2 for >= 65 years. Pt verbalizes motivation to increase frequency of small meals including sources of protein such as dairy, chicken, and fish.      Intervention Plan   Intervention Prescribe, educate and counsel regarding individualized specific dietary modifications aiming towards targeted core components such as weight, hypertension, lipid management, diabetes, heart failure and other comorbidities.;Nutrition handout(s) given to patient.   Handouts: Lean Protein for COPD, Weight Gain and Weight Maintenance   Expected Outcomes Short Term Goal: Understand basic principles of dietary content, such as calories, fat, sodium, cholesterol and nutrients.;Long Term Goal: Adherence to prescribed nutrition plan.          Nutrition Discharge:   Education Questionnaire Score:  Knowledge Questionnaire Score - 12/26/23 1433       Knowledge Questionnaire Score   Pre Score 15/18          Goals reviewed with patient; copy given to patient.    [1]  Social History Tobacco Use  Smoking Status Former   Current packs/day: 0.00   Average packs/day: 1.0 packs/day   Types: Cigarettes   Quit date: 08/21/2023   Years since quitting: 0.5  Smokeless Tobacco Never  Tobacco Comments   Quit smoking July 2025

## 2024-02-26 ENCOUNTER — Encounter (HOSPITAL_COMMUNITY)

## 2024-02-27 ENCOUNTER — Ambulatory Visit: Admitting: Internal Medicine

## 2024-02-27 ENCOUNTER — Encounter: Payer: Self-pay | Admitting: Internal Medicine

## 2024-02-27 VITALS — BP 148/86 | HR 102 | Ht 73.0 in | Wt 167.0 lb

## 2024-02-27 DIAGNOSIS — J40 Bronchitis, not specified as acute or chronic: Secondary | ICD-10-CM | POA: Diagnosis not present

## 2024-02-27 DIAGNOSIS — F410 Panic disorder [episodic paroxysmal anxiety] without agoraphobia: Secondary | ICD-10-CM | POA: Diagnosis not present

## 2024-02-27 DIAGNOSIS — J449 Chronic obstructive pulmonary disease, unspecified: Secondary | ICD-10-CM | POA: Diagnosis not present

## 2024-02-27 DIAGNOSIS — R0902 Hypoxemia: Secondary | ICD-10-CM | POA: Diagnosis not present

## 2024-02-27 MED ORDER — DM-GUAIFENESIN ER 30-600 MG PO TB12: 2.0000 | ORAL_TABLET | Freq: Two times a day (BID) | ORAL | 3 refills | Status: AC

## 2024-02-27 MED ORDER — CEFDINIR 300 MG PO CAPS: 300.0000 mg | ORAL_CAPSULE | Freq: Two times a day (BID) | ORAL | 0 refills | Status: AC

## 2024-02-27 MED ORDER — HYDROCODONE BIT-HOMATROP MBR 5-1.5 MG/5ML PO SOLN: 5.0000 mL | Freq: Three times a day (TID) | ORAL | 0 refills | Status: AC | PRN

## 2024-02-27 MED ORDER — PREDNISONE 10 MG PO TABS: ORAL_TABLET | ORAL | 1 refills | Status: AC

## 2024-02-27 NOTE — Assessment & Plan Note (Signed)
 Prescribed Omnicef  twice daily for 10 days

## 2024-02-27 NOTE — Progress Notes (Signed)
 "  Subjective:  Patient ID: Travis Hicks, male    DOB: 25-Jul-1950  Age: 74 y.o. MRN: [id number]  CC: COPD (COPD, Hypoxia, Seen by ED for issue 12/26)   HPI Travis Hicks presents for severe SOB/DOE , worse since the end of December 2025. Unable to use Trelegy, MDI well due to his COPD HHN (DuoNeb effect would last for 30 min only C/o cough, thick mucus On O2 3-4 l/min at home He was in the emergency room on a Cristmas day -his chest x-ray was clear.  He was given prednisone  40 mg daily for 4 days.  That helped him a little. He is here with [name].  She thinks that Travis Hicks gets panic attacks on top of his COPD problems.  Outpatient Medications Prior to Visit  Medication Sig Dispense Refill   albuterol  (VENTOLIN  HFA) 108 (90 Base) MCG/ACT inhaler Inhale 2 puffs into the lungs every 6 (six) hours as needed for wheezing or shortness of breath. 8 g 11   b complex vitamins tablet Take 1 tablet by mouth daily. 100 tablet 3   Cholecalciferol (VITAMIN D3) 50 MCG (2000 UT) capsule Take 2 capsules (4,000 Units total) by mouth daily. 100 capsule 3   guaiFENesin  (MUCINEX ) 600 MG 12 hr tablet Take 1 tablet (600 mg total) by mouth 2 (two) times daily. 60 tablet 1   ipratropium-albuterol  (DUONEB) 0.5-2.5 (3) MG/3ML SOLN Take 3 mLs by nebulization every 4 (four) hours as needed. 360 mL 6   latanoprost  (XALATAN ) 0.005 % ophthalmic solution Place 1 drop into the left eye at bedtime.     Multiple Vitamin (MULTIVITAMIN WITH MINERALS) TABS tablet Take 1 tablet by mouth daily. 30 tablet 0   TRELEGY ELLIPTA  100-62.5-25 MCG/ACT AEPB TAKE 1 PUFF BY MOUTH EVERY DAY 60 each 5   No facility-administered medications prior to visit.    ROS: Review of Systems  Constitutional:  Positive for fatigue. Negative for appetite change and unexpected weight change.  HENT:  Positive for congestion, postnasal drip, rhinorrhea, sinus pressure and voice change. Negative for nosebleeds, sneezing, sore throat and trouble  swallowing.   Eyes:  Negative for itching and visual disturbance.  Respiratory:  Positive for cough, choking and shortness of breath. Negative for wheezing.   Cardiovascular:  Negative for chest pain, palpitations and leg swelling.  Gastrointestinal:  Negative for abdominal distention, blood in stool, diarrhea and nausea.  Genitourinary:  Negative for dysuria, frequency, hematuria and urgency.  Musculoskeletal:  Negative for back pain, gait problem, joint swelling and neck pain.  Skin:  Negative for rash.  Neurological:  Positive for weakness. Negative for dizziness, tremors, syncope and speech difficulty.  Psychiatric/Behavioral:  Positive for agitation. Negative for dysphoric mood, sleep disturbance and suicidal ideas. The patient is nervous/anxious.     Objective:  BP (!) 148/86   Pulse (!) 102   Ht 6' 1 (1.854 m)   Wt 167 lb (75.8 kg)   SpO2 90%   BMI 22.03 kg/m   BP Readings from Last 3 Encounters:  02/27/24 (!) 148/86  02/15/24 (!) 128/95  01/28/24 130/66    Wt Readings from Last 3 Encounters:  02/27/24 167 lb (75.8 kg)  02/12/24 162 lb 11.2 oz (73.8 kg)  01/29/24 165 lb 12.6 oz (75.2 kg)    Physical Exam Constitutional:      General: He is in acute distress.     Appearance: He is well-developed. He is ill-appearing. He is not toxic-appearing.     Comments: NAD  Eyes:     Conjunctiva/sclera: Conjunctivae normal.     Pupils: Pupils are equal, round, and reactive to light.  Neck:     Thyroid : No thyromegaly.     Vascular: No JVD.  Cardiovascular:     Rate and Rhythm: Normal rate and regular rhythm.     Heart sounds: Normal heart sounds. No murmur heard.    No friction rub. No gallop.  Pulmonary:     Effort: Pulmonary effort is normal. No respiratory distress.     Breath sounds: Rhonchi present. No wheezing or rales.  Chest:     Chest wall: No tenderness.  Abdominal:     General: Bowel sounds are normal. There is no distension.     Palpations: Abdomen is  soft. There is no mass.     Tenderness: There is no abdominal tenderness. There is no guarding or rebound.  Musculoskeletal:        General: No tenderness. Normal range of motion.     Cervical back: Normal range of motion.  Lymphadenopathy:     Cervical: No cervical adenopathy.  Skin:    General: Skin is warm and dry.     Findings: No rash.  Neurological:     Mental Status: He is alert and oriented to person, place, and time.     Cranial Nerves: No cranial nerve deficit.     Motor: Weakness present. No abnormal muscle tone.     Coordination: Coordination normal.     Gait: Gait normal.     Deep Tendon Reflexes: Reflexes are normal and symmetric.  Psychiatric:        Behavior: Behavior normal.        Thought Content: Thought content normal.        Judgment: Judgment normal.   Dyspneic w/minor exertion.  He is in chronic respiratory distress. O2 sat >90% on 3-4 l/min O2  Lab Results  Component Value Date   WBC 7.2 02/15/2024   HGB 14.0 02/15/2024   HCT 42.5 02/15/2024   PLT 320 02/15/2024   GLUCOSE 98 02/15/2024   CHOL 188 12/18/2023   TRIG 116.0 12/18/2023   HDL 97.30 12/18/2023   LDLCALC 68 12/18/2023   ALT 22 01/23/2024   AST 27 01/23/2024   NA 141 02/15/2024   K 3.9 02/15/2024   CL 100 02/15/2024   CREATININE 0.82 02/15/2024   BUN 10 02/15/2024   CO2 29 02/15/2024   TSH 1.37 12/18/2023   PSA 0.57 12/18/2023   INR 0.9 09/26/2021    No results found.  Assessment & Plan:   Problem List Items Addressed This Visit     COPD mixed type (HCC) - Primary   Worse - severe SOB/DOE , much worse since the end of December 2025. Unable to use Trelegy, MDI well due to his COPD HHN (DuoNeb effect would last for 30 min only C/o cough, thick mucus On O2 3-4 l/min at home He was in the emergency room on a Cristmas day -his chest x-ray was clear.  He was given prednisone  40 mg daily for 4 days.  That helped him a little. He is here with [name].  She thinks that  Travis Hicks gets panic attacks on top of his COPD problems.  Pulmonary referral was placed Request for oxygen concentrator humidifier was faxed to [facility] Continue with home 3-4 L of O2 per minute to keep sats above 90% Prednisone  taper starting 40 mg a day and down to 10 mg a day to continue  The patient refused Depo-Medrol  shot Will use hand-held nebulizer DuoNeb every 4 hours while awake Will treat panic attacks      Relevant Medications   dextromethorphan-guaiFENesin  (MUCINEX  DM) 30-600 MG 12hr tablet   predniSONE  (DELTASONE ) 10 MG tablet   HYDROcodone  bit-homatropine (HYCODAN) 5-1.5 MG/5ML syrup   Other Relevant Orders   Ambulatory referral to Pulmonology   Hypoxia   Worse - severe SOB/DOE , much worse since the end of December 2025. Unable to use Trelegy, MDI well due to his COPD HHN (DuoNeb effect would last for 30 min only C/o cough, thick mucus On O2 3-4 l/min at home He was in the emergency room on a Cristmas day -his chest x-ray was clear.  He was given prednisone  40 mg daily for 4 days.  That helped him a little. He is here with [name].  She thinks that Travis Hicks gets panic attacks on top of his COPD problems.  Pulmonary referral was placed Request for oxygen concentrator humidifier was faxed to [facility] Continue with home 3-4 L of O2 per minute to keep sats above 90% Prednisone  taper starting 40 mg a day and down to 10 mg a day to continue The patient refused Depo-Medrol  shot Will use hand-held nebulizer DuoNeb every 4 hours while awake Will treat panic attacks      Bronchitis   Prescribed Omnicef  twice daily for 10 days      Panic attacks   He is here with [name].  She thinks that Travis Hicks gets panic attacks on top of his COPD problems. Prescribed lorazepam to use with caution  Potential benefits of a long term benzodiazepines  use as well as potential risks especially in COPD patients and complications were explained to the patient and were  aknowledged.       Other Visit Diagnoses       Hypoxemia       Relevant Orders   Ambulatory referral to Pulmonology         Meds ordered this encounter  Medications   dextromethorphan-guaiFENesin  (MUCINEX  DM) 30-600 MG 12hr tablet    Sig: Take 2 tablets by mouth 2 (two) times daily.    Dispense:  120 tablet    Refill:  3   predniSONE  (DELTASONE ) 10 MG tablet    Sig: Prednisone  10 mg: take 4 tabs a day x 3 days; then 3 tabs a day x 4 days; then 2 tabs a day x 4 days, then 1 tab a day. Take pc.    Dispense:  100 tablet    Refill:  1   cefdinir  (OMNICEF ) 300 MG capsule    Sig: Take 1 capsule (300 mg total) by mouth 2 (two) times daily.    Dispense:  20 capsule    Refill:  0   HYDROcodone  bit-homatropine (HYCODAN) 5-1.5 MG/5ML syrup    Sig: Take 5 mLs by mouth every 8 (eight) hours as needed for cough.    Dispense:  240 mL    Refill:  0      Follow-up: Return in about 4 weeks (around 03/26/2024) for a follow-up visit.  Marolyn Noel, MD "

## 2024-02-27 NOTE — Assessment & Plan Note (Signed)
 Worse - severe SOB/DOE , much worse since the end of December 2025. Unable to use Trelegy, MDI well due to his COPD HHN (DuoNeb effect would last for 30 min only C/o cough, thick mucus On O2 3-4 l/min at home He was in the emergency room on a Cristmas day -his chest x-ray was clear.  He was given prednisone  40 mg daily for 4 days.  That helped him a little. He is here with [name].  She thinks that Travis Hicks gets panic attacks on top of his COPD problems.  Pulmonary referral was placed Request for oxygen concentrator humidifier was faxed to [facility] Continue with home 3-4 L of O2 per minute to keep sats above 90% Prednisone  taper starting 40 mg a day and down to 10 mg a day to continue The patient refused Depo-Medrol  shot Will use hand-held nebulizer DuoNeb every 4 hours while awake Will treat panic attacks

## 2024-02-27 NOTE — Assessment & Plan Note (Signed)
 He is here with his wife Travis Hicks.  She thinks that Travis Hicks gets panic attacks on top of his COPD problems. Prescribed lorazepam to use with caution  Potential benefits of a long term benzodiazepines  use as well as potential risks especially in COPD patients and complications were explained to the patient and were aknowledged.

## 2024-02-27 NOTE — Assessment & Plan Note (Signed)
 Worse - severe SOB/DOE , much worse since the end of December 2025. Unable to use Trelegy, MDI well due to his COPD HHN (DuoNeb effect would last for 30 min only C/o cough, thick mucus On O2 3-4 l/min at home He was in the emergency room on a Cristmas day -his chest x-ray was clear.  He was given prednisone  40 mg daily for 4 days.  That helped him a little. He is here with his wife Travis Hicks.  She thinks that Travis Hicks gets panic attacks on top of his COPD problems.  Pulmonary referral was placed Request for oxygen concentrator humidifier was faxed to [facility] Continue with home 3-4 L of O2 per minute to keep sats above 90% Prednisone  taper starting 40 mg a day and down to 10 mg a day to continue The patient refused Depo-Medrol  shot Will use hand-held nebulizer DuoNeb every 4 hours while awake Will treat panic attacks

## 2024-02-28 ENCOUNTER — Encounter (HOSPITAL_COMMUNITY)

## 2024-02-29 ENCOUNTER — Telehealth: Payer: Self-pay

## 2024-02-29 ENCOUNTER — Encounter: Payer: Self-pay | Admitting: Adult Health

## 2024-02-29 ENCOUNTER — Ambulatory Visit: Admitting: Adult Health

## 2024-02-29 VITALS — BP 130/92 | HR 92 | Temp 97.6°F | Ht 73.0 in | Wt 167.4 lb

## 2024-02-29 DIAGNOSIS — E46 Unspecified protein-calorie malnutrition: Secondary | ICD-10-CM | POA: Diagnosis not present

## 2024-02-29 DIAGNOSIS — Z87891 Personal history of nicotine dependence: Secondary | ICD-10-CM | POA: Diagnosis not present

## 2024-02-29 DIAGNOSIS — J479 Bronchiectasis, uncomplicated: Secondary | ICD-10-CM | POA: Diagnosis not present

## 2024-02-29 DIAGNOSIS — J449 Chronic obstructive pulmonary disease, unspecified: Secondary | ICD-10-CM

## 2024-02-29 DIAGNOSIS — E440 Moderate protein-calorie malnutrition: Secondary | ICD-10-CM

## 2024-02-29 DIAGNOSIS — J441 Chronic obstructive pulmonary disease with (acute) exacerbation: Secondary | ICD-10-CM

## 2024-02-29 DIAGNOSIS — J9611 Chronic respiratory failure with hypoxia: Secondary | ICD-10-CM

## 2024-02-29 DIAGNOSIS — J432 Centrilobular emphysema: Secondary | ICD-10-CM

## 2024-02-29 DIAGNOSIS — J439 Emphysema, unspecified: Secondary | ICD-10-CM | POA: Diagnosis not present

## 2024-02-29 DIAGNOSIS — R5381 Other malaise: Secondary | ICD-10-CM

## 2024-02-29 MED ORDER — OHTUVAYRE 3 MG/2.5ML IN SUSP: 3.0000 mg | Freq: Two times a day (BID) | RESPIRATORY_TRACT | Status: AC

## 2024-02-29 NOTE — Progress Notes (Signed)
 "  @Patient  ID: Travis Hicks, male    DOB: 09/19/1950, 74 y.o.   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  History of Present Illness Travis Hicks is a 74 year old male with COPD and emphysema who presents with worsening respiratory symptoms.  He initially sought medical attention on December 3rd for lower abdominal pain, which was later attributed to skeletal or muscular issues. At that time, he was already experiencing respiratory difficulties. Seems to be slowly getting worse this past 4 weeks and then week of Christmas increased cough, wheezing and shortness of breath.   On December 26th, he returned to the emergency room due to oxygen saturation levels frequently dropping to 82-85%, with significant shortness of breath after minimal exertion. He was coughing up yellow mucus but did not have a fever. A chest x-ray ruled out pneumonia.  ER lab work was unrevealing.  He was seen by his primary care provider this week and started on Omnicef  for 10 days and a prednisone  taper yesterday He uses a Trelegy inhaler once daily and a nebulizer with DuoNeb three to four times a day. He possesses a flutter valve but has not been using it.  His oxygen therapy was adjusted during his hospital visit, initially set at two liters and increased to four liters due to low  oxygen levels. He maintains it at four liters at home, with recent improvements in oxygen saturation to 90-91%.  Today in the office O2 saturations 94 to 95% on 3 L of oxygen  He experiences occasional cough, sometimes triggered by blowing his nose, but denies hemoptysis. He has been using Mucinex  since becoming ill .     Allergies[1]  Immunization History  Administered Date(s) Administered   PFIZER(Purple Top)SARS-COV-2 Vaccination 04/04/2019, 04/29/2019, 12/02/2019   PNEUMOCOCCAL CONJUGATE-20 01/09/2022   Zoster Recombinant(Shingrix) 12/12/2021, 05/02/2022    Past Medical History:  Diagnosis Date   Cataract    veru small per pt .   Emphysema of lung (HCC)    Glaucoma 2018   Hypoxia 02/27/2024   Overactive bladder     Tobacco History: Tobacco Use History[2] Counseling given: Not Answered Tobacco comments: Quit smoking July 2025   Outpatient Medications Prior to Visit  Medication Sig Dispense Refill   albuterol  (VENTOLIN  HFA) 108 (90 Base) MCG/ACT inhaler Inhale 2 puffs into the lungs every 6 (six) hours as needed for wheezing or shortness of breath. 8 g 11   b complex vitamins tablet Take 1 tablet by mouth daily. 100 tablet 3   cefdinir  (OMNICEF ) 300 MG capsule Take 1 capsule (300 mg total) by mouth 2 (two) times daily. 20 capsule 0   Cholecalciferol (VITAMIN D3) 50 MCG (2000 UT) capsule Take 2 capsules (4,000 Units total) by mouth daily. (Patient taking differently: Take 2,000 Units by mouth daily.) 100 capsule 3   guaiFENesin  (MUCINEX ) 600 MG  12 hr tablet Take 1 tablet (600 mg total) by mouth 2 (two) times daily. 60 tablet 1   HYDROcodone  bit-homatropine (HYCODAN) 5-1.5 MG/5ML syrup Take 5 mLs by mouth every 8 (eight) hours as needed for cough. 240 mL 0   ipratropium-albuterol  (DUONEB) 0.5-2.5 (3) MG/3ML SOLN Take 3 mLs by nebulization every 4 (four) hours as needed. 360 mL 6   latanoprost  (XALATAN ) 0.005 % ophthalmic solution Place 1 drop into the left eye at bedtime.      Multiple Vitamin (MULTIVITAMIN WITH MINERALS) TABS tablet Take 1 tablet by mouth daily. 30 tablet 0   predniSONE  (DELTASONE ) 10 MG tablet Prednisone  10 mg: take 4 tabs a day x 3 days; then 3 tabs a day x 4 days; then 2 tabs a day x 4 days, then 1 tab a day. Take pc. 100 tablet 1   TRELEGY ELLIPTA  100-62.5-25 MCG/ACT AEPB TAKE 1 PUFF BY MOUTH EVERY DAY 60 each 5   dextromethorphan-guaiFENesin  (MUCINEX  DM) 30-600 MG 12hr tablet Take 2 tablets by mouth 2 (two) times daily. (Patient not taking: Reported on 02/29/2024) 120 tablet 3   No facility-administered medications prior to visit.     Review of Systems:   Constitutional:   No  weight loss, night sweats,  Fevers, chills, +fatigue, or  lassitude.  HEENT:   No headaches,  Difficulty swallowing,  Tooth/dental problems, or  Sore throat,                No sneezing, itching, ear ache, nasal congestion, post nasal drip,   CV:  No chest pain,  Orthopnea, PND, swelling in lower extremities, anasarca, dizziness, palpitations, syncope.   GI  No heartburn, indigestion, abdominal pain, nausea, vomiting, diarrhea, change in bowel habits, loss of appetite, bloody stools.   Resp:   No chest wall deformity  Skin: no rash or lesions.  GU: no dysuria, change in color of urine, no urgency or frequency.  No flank pain, no hematuria   MS:  No joint pain or swelling.  No decreased range of motion.  No back pain.    Physical Exam  BP (!) 170/113   Pulse 92   Temp 97.6 F (36.4 C)   Ht 6' 1 (1.854 m) Comment: Per pt  Wt 167 lb 6.4 oz (75.9 kg)   SpO2 91% Comment: 3L Pulse o2  BMI 22.09 kg/m   GEN: A/Ox3; pleasant , NAD, frail, elderly, on oxygen   HEENT:  Bodfish/AT,  , NOSE-clear, THROAT-clear, no lesions, no postnasal drip or exudate noted.   NECK:  Supple w/ fair ROM; no JVD; normal carotid impulses w/o bruits; no thyromegaly or nodules palpated; no lymphadenopathy.    RESP few trace rhonchi  no accessory muscle use, no dullness to  percussion  CARD:  RRR, no m/r/g, no peripheral edema, pulses intact, no cyanosis or clubbing.  GI:   Soft & nt; nml bowel sounds; no organomegaly or masses detected.   Musco: Warm bil, no deformities or joint swelling noted.   Neuro: alert, no focal deficits noted.    Skin: Warm, no lesions or rashes    Lab Results:Reviewed 02/29/2024   CBC    Component Value Date/Time   WBC 7.2 02/15/2024 1124   RBC 4.49 02/15/2024 1124   HGB 14.0 02/15/2024 1124   HCT 42.5 02/15/2024 1124   PLT 320 02/15/2024 1124   MCV 94.7 02/15/2024 1124   MCH 31.2 02/15/2024 1124   MCHC 32.9 02/15/2024 1124   RDW 12.4 02/15/2024 1124  LYMPHSABS 0.9 12/18/2023 1520   MONOABS 0.6 12/18/2023 1520   EOSABS 0.1 12/18/2023 1520   BASOSABS 0.0 12/18/2023 1520    BMET    Component Value Date/Time   NA 141 02/15/2024 1124   K 3.9 02/15/2024 1124   CL 100 02/15/2024 1124   CO2 29 02/15/2024 1124   GLUCOSE 98 02/15/2024 1124   BUN 10 02/15/2024 1124   CREATININE 0.82 02/15/2024 1124   CREATININE 0.77 03/05/2020 1645   CALCIUM 9.5 02/15/2024 1124   GFRNONAA >60 02/15/2024 1124   GFRAA >90 07/12/2013 2013      ProBNP No results found for: PROBNP  Imaging: DG Chest 2 View Result Date: 02/15/2024 CLINICAL DATA:  Shortness of breath. EXAM: CHEST - 2 VIEW COMPARISON:  10/31/2021 FINDINGS: Lungs are hyperexpanded. The lungs are clear without focal pneumonia, edema, pneumothorax or pleural effusion. The cardiopericardial silhouette is within normal limits for size. No acute bony abnormality. IMPRESSION: Hyperexpansion without acute cardiopulmonary findings. Electronically Signed   By: Camellia Candle M.D.   On: 02/15/2024 12:24    Administration History     None          Latest Ref Rng & Units 12/14/2021    2:57 PM  PFT Results  FVC-Pre L 3.38   FVC-Predicted Pre % 71   FVC-Post L 3.15   FVC-Predicted Post % 66   Pre FEV1/FVC % % 45   Post FEV1/FCV % % 42   FEV1-Pre L 1.53    FEV1-Predicted Pre % 43   FEV1-Post L 1.34   DLCO uncorrected ml/min/mmHg 14.44   DLCO UNC% % 52   DLCO corrected ml/min/mmHg 14.44   DLCO COR %Predicted % 52   DLVA Predicted % 59   TLC L 9.46   TLC % Predicted % 127   RV % Predicted % 230     No results found for: NITRICOXIDE      No data to display              Assessment & Plan:   Assessment and Plan Assessment & Plan 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 Oxygen saturation dropped to 82-85% on 2L oxygen, necessitating an increase to 4L. There is no pneumonia on the chest x-ray, and blood work is essentially normal. Wife has URI symptoms as well.  We discussed adding and biologic therapy for frequent exacerbations.  He declined Dupixent due to needle phobia,.  We discussed adding Ohtuvayre   Twice daily . Paperwork approval process initiated. Continue Trelegy one puff daily and use DuoNeb nebulizer three to four times daily. Use a flutter valve after nebulizer treatments to aid mucus clearance. Finish Omnicef  as directed and taper prednisone  to 10 mg daily. Begin Ohtuvayre  nebulizer treatment pending insurance approval. Use Robitussin DM as needed for cough and adjust oxygen to maintain saturation between 90-94%.  Chronic respiratory failure with hypoxemia   He has chronic respiratory failure with hypoxemia, requiring 4L oxygen at home. Oxygen saturation has improved to 94% on 3L. Avoid excessive oxygen to prevent CO2 retention. He has been educated on managing hypoxemia and panic during exacerbations. Maintain oxygen at 3-4L to achieve saturation between 90-94% and adjust as needed based on saturation levels.  Protein calorie malnutrition with BMI 22.  Continue on high-protein diet  Physical deconditioning.  Ongoing with decreased activity tolerance.   Activity as tolerated.  High-protein diet.  Plan  Patient Instructions  Finish Omnicef  as directed  Taper Prednisone  to 10mg  daily as directed  1 tsp Robitussin DM every 4hr As needed   Begin Ohtuvayre  Neb Twice daily   Continue on Trelegy 1 puff daily , rinse after use.  Albuterol  inhaler As needed   Duoneb 3-4 times daily  Begin Flutter valve Three times a day  after nebs  Follow up in 4 weeks and As needed   Please contact office for sooner follow up if symptoms do not improve or worsen or seek emergency care            Carie Kapuscinski, NP 02/29/2024  I spent  42 minutes dedicated to the care of this patient on the date of this encounter to include pre-visit review of records, face-to-face time with the patient discussing conditions above, post visit ordering of testing, clinical documentation with the electronic health record, making appropriate referrals as documented, and communicating necessary findings to members of the patients care team.      [1]  Allergies Allergen Reactions   Donepezil      Stomach pain   Viagra [Sildenafil Citrate] Other (See Comments)    Unknown per Pt  [2]  Social History Tobacco Use  Smoking Status Former   Current packs/day: 0.00   Average packs/day: 1.0 packs/day   Types: Cigarettes   Quit date: 08/21/2023   Years since quitting: 0.5  Smokeless Tobacco Never  Tobacco Comments   Quit smoking July 2025   "

## 2024-02-29 NOTE — Telephone Encounter (Signed)
 Pt signed Ohtuvayre  paperwork at [facility] with Tammy Parrett on 02/29/24. Paperwork was signed by Madelin Stank, NP and placed in the pharmacy mailbox up front.

## 2024-02-29 NOTE — Patient Instructions (Addendum)
 Finish Omnicef  as directed  Taper Prednisone  to 10mg  daily as directed  1 tsp Robitussin DM every 4hr As needed   Begin Ohtuvayre  Neb Twice daily   Continue on Trelegy 1 puff daily , rinse after use.  Albuterol  inhaler As needed   Duoneb 3-4 times daily  Begin Flutter valve Three times a day  after nebs  Follow up in 4 weeks and As needed   Please contact office for sooner follow up if symptoms do not improve or worsen or seek emergency care

## 2024-03-04 ENCOUNTER — Encounter (HOSPITAL_COMMUNITY)

## 2024-03-06 ENCOUNTER — Encounter (HOSPITAL_COMMUNITY)

## 2024-03-11 ENCOUNTER — Encounter (HOSPITAL_COMMUNITY)

## 2024-03-11 ENCOUNTER — Telehealth: Payer: Self-pay

## 2024-03-11 NOTE — Telephone Encounter (Signed)
 Received Ohtuvayre  new start paperwork. Completed form and faxed with clinicals and insurance card copy to [facility]   Phone#: [phone] Fax#: [phone]

## 2024-03-13 ENCOUNTER — Encounter (HOSPITAL_COMMUNITY)

## 2024-03-14 NOTE — Telephone Encounter (Signed)
 Received fax from [employer] with summary of benefits. Referral form for Ohtuvayre  received. Rx will be triaged to [facility] (phone: [phone]). Once benefits investigation completed, pharmacy will reach out the patient to schedule shipment. If medication is unaffordable, patient will need to express financial hardship to be referred back to [facility] for patient assistance program pre-screening.   Patient ID: [id number] Pharmacy phone: [facility] (phone: [phone]) [facility] Phone#: [phone]

## 2024-03-18 ENCOUNTER — Encounter (HOSPITAL_COMMUNITY)

## 2024-03-20 ENCOUNTER — Encounter (HOSPITAL_COMMUNITY)

## 2024-03-25 ENCOUNTER — Encounter (HOSPITAL_COMMUNITY)

## 2024-03-27 ENCOUNTER — Encounter (HOSPITAL_COMMUNITY)

## 2024-04-01 ENCOUNTER — Encounter (HOSPITAL_COMMUNITY)

## 2024-04-02 ENCOUNTER — Ambulatory Visit: Admitting: Internal Medicine

## 2024-04-14 ENCOUNTER — Ambulatory Visit: Admitting: Adult Health

## 2024-05-28 ENCOUNTER — Ambulatory Visit: Admitting: Internal Medicine

## 2024-08-18 ENCOUNTER — Ambulatory Visit
